# Patient Record
Sex: Female | Born: 1980 | Race: Black or African American | Hispanic: No | Marital: Married | Smoking: Never smoker
Health system: Southern US, Community
[De-identification: ages and names within clinical notes are randomized; demographics above are authoritative.]

## PROBLEM LIST (undated history)

## (undated) ENCOUNTER — Inpatient Hospital Stay (HOSPITAL_COMMUNITY): Payer: Self-pay

## (undated) DIAGNOSIS — D6800 Von Willebrand disease, unspecified: Secondary | ICD-10-CM

## (undated) DIAGNOSIS — O0993 Supervision of high risk pregnancy, unspecified, third trimester: Secondary | ICD-10-CM

## (undated) DIAGNOSIS — T7840XA Allergy, unspecified, initial encounter: Secondary | ICD-10-CM

## (undated) DIAGNOSIS — N390 Urinary tract infection, site not specified: Secondary | ICD-10-CM

## (undated) DIAGNOSIS — I10 Essential (primary) hypertension: Secondary | ICD-10-CM

## (undated) HISTORY — DX: Von Willebrand's disease: D68.0

## (undated) HISTORY — DX: Urinary tract infection, site not specified: N39.0

## (undated) HISTORY — DX: Allergy, unspecified, initial encounter: T78.40XA

## (undated) HISTORY — DX: Supervision of high risk pregnancy, unspecified, third trimester: O09.93

## (undated) HISTORY — PX: GYNECOLOGIC CRYOSURGERY: SHX857

## (undated) HISTORY — PX: OTHER SURGICAL HISTORY: SHX169

---

## 1998-03-21 ENCOUNTER — Other Ambulatory Visit: Admission: RE | Admit: 1998-03-21 | Discharge: 1998-03-21 | Payer: Self-pay | Admitting: Obstetrics & Gynecology

## 1998-04-26 ENCOUNTER — Other Ambulatory Visit: Admission: RE | Admit: 1998-04-26 | Discharge: 1998-04-26 | Payer: Self-pay | Admitting: Obstetrics & Gynecology

## 2000-01-28 ENCOUNTER — Other Ambulatory Visit: Admission: RE | Admit: 2000-01-28 | Discharge: 2000-01-28 | Payer: Self-pay | Admitting: Obstetrics & Gynecology

## 2001-04-12 ENCOUNTER — Other Ambulatory Visit: Admission: RE | Admit: 2001-04-12 | Discharge: 2001-04-12 | Payer: Self-pay | Admitting: Obstetrics and Gynecology

## 2002-04-25 ENCOUNTER — Other Ambulatory Visit: Admission: RE | Admit: 2002-04-25 | Discharge: 2002-04-25 | Payer: Self-pay | Admitting: Obstetrics

## 2002-10-05 ENCOUNTER — Inpatient Hospital Stay (HOSPITAL_COMMUNITY): Admission: AD | Admit: 2002-10-05 | Discharge: 2002-10-09 | Payer: Self-pay | Admitting: Obstetrics

## 2002-10-07 ENCOUNTER — Encounter: Payer: Self-pay | Admitting: Obstetrics

## 2003-03-17 ENCOUNTER — Emergency Department (HOSPITAL_COMMUNITY): Admission: EM | Admit: 2003-03-17 | Discharge: 2003-03-17 | Payer: Self-pay | Admitting: Emergency Medicine

## 2003-04-26 ENCOUNTER — Ambulatory Visit (HOSPITAL_COMMUNITY): Admission: RE | Admit: 2003-04-26 | Discharge: 2003-04-26 | Payer: Self-pay | Admitting: Urology

## 2003-04-26 ENCOUNTER — Encounter: Payer: Self-pay | Admitting: Urology

## 2007-03-10 ENCOUNTER — Ambulatory Visit (HOSPITAL_COMMUNITY): Admission: RE | Admit: 2007-03-10 | Discharge: 2007-03-10 | Payer: Self-pay | Admitting: General Surgery

## 2007-03-25 ENCOUNTER — Emergency Department (HOSPITAL_COMMUNITY): Admission: EM | Admit: 2007-03-25 | Discharge: 2007-03-25 | Payer: Self-pay | Admitting: Emergency Medicine

## 2012-12-16 ENCOUNTER — Encounter (HOSPITAL_COMMUNITY): Payer: Self-pay

## 2012-12-16 ENCOUNTER — Inpatient Hospital Stay (HOSPITAL_COMMUNITY)
Admission: AD | Admit: 2012-12-16 | Discharge: 2012-12-17 | Disposition: A | Discharge status: Home / Self Care | Payer: 59 | Source: Ambulatory Visit | Attending: Obstetrics | Admitting: Obstetrics

## 2012-12-16 ENCOUNTER — Inpatient Hospital Stay (HOSPITAL_COMMUNITY): Payer: 59

## 2012-12-16 DIAGNOSIS — Z363 Encounter for antenatal screening for malformations: Secondary | ICD-10-CM

## 2012-12-16 DIAGNOSIS — D259 Leiomyoma of uterus, unspecified: Secondary | ICD-10-CM | POA: Diagnosis present

## 2012-12-16 DIAGNOSIS — O3410 Maternal care for benign tumor of corpus uteri, unspecified trimester: Secondary | ICD-10-CM | POA: Insufficient documentation

## 2012-12-16 DIAGNOSIS — O209 Hemorrhage in early pregnancy, unspecified: Secondary | ICD-10-CM | POA: Insufficient documentation

## 2012-12-16 DIAGNOSIS — O418X90 Other specified disorders of amniotic fluid and membranes, unspecified trimester, not applicable or unspecified: Secondary | ICD-10-CM | POA: Diagnosis present

## 2012-12-16 DIAGNOSIS — Z3490 Encounter for supervision of normal pregnancy, unspecified, unspecified trimester: Secondary | ICD-10-CM

## 2012-12-16 DIAGNOSIS — O4690 Antepartum hemorrhage, unspecified, unspecified trimester: Secondary | ICD-10-CM | POA: Diagnosis present

## 2012-12-16 DIAGNOSIS — Z1389 Encounter for screening for other disorder: Secondary | ICD-10-CM

## 2012-12-16 DIAGNOSIS — O468X9 Other antepartum hemorrhage, unspecified trimester: Secondary | ICD-10-CM | POA: Diagnosis present

## 2012-12-16 HISTORY — DX: Von Willebrand's disease: D68.0

## 2012-12-16 HISTORY — DX: Von Willebrand disease, unspecified: D68.00

## 2012-12-16 LAB — URINE MICROSCOPIC-ADD ON

## 2012-12-16 LAB — CBC
HCT: 39 % (ref 36.0–46.0)
Hemoglobin: 13.1 g/dL (ref 12.0–15.0)
MCHC: 33.6 g/dL (ref 30.0–36.0)
MCV: 83.7 fL (ref 78.0–100.0)
RDW: 15.4 % (ref 11.5–15.5)

## 2012-12-16 LAB — URINALYSIS, ROUTINE W REFLEX MICROSCOPIC
Bilirubin Urine: NEGATIVE
Glucose, UA: NEGATIVE mg/dL
Ketones, ur: NEGATIVE mg/dL
Leukocytes, UA: NEGATIVE
Nitrite: NEGATIVE
Protein, ur: NEGATIVE mg/dL
Specific Gravity, Urine: <1.005 — ABNORMAL LOW (ref 1.005–1.030)
Urobilinogen, UA: 0.2 mg/dL (ref 0.0–1.0)
pH: 7 (ref 5.0–8.0)

## 2012-12-16 LAB — WET PREP, GENITAL: Trich, Wet Prep: NONE SEEN

## 2012-12-16 LAB — POCT PREGNANCY, URINE: Preg Test, Ur: POSITIVE — AB

## 2012-12-16 MED ORDER — RHO D IMMUNE GLOBULIN 1500 UNIT/2ML IJ SOLN
300.0000 ug | INTRAMUSCULAR | Status: AC
  Administered 2012-12-17: 300 ug via INTRAMUSCULAR

## 2012-12-16 NOTE — Progress Notes (Signed)
Pt states she started having vaginal cramping on  The 12/14/12 pt  Rates her pain 1-2

## 2012-12-16 NOTE — MAU Provider Note (Signed)
History     CSN: [id number]  Arrival date and time: 12/16/12 1743   First Provider Initiated Contact with Patient 12/16/12 1844      Chief Complaint  Patient presents with  . Possible Pregnancy  . Vaginal Bleeding   HPI Ms. Courtney [name] is a 32 y.o. G1 P0 who presents to MAU today with complaint of spotting and cramping. The patient has been spotting lightly since 12/16, which would have coincided with her normal period. It was off and on and very light until about 1 week ago when it became more consistent daily. She still did not need more than a panty liner. She has had mild cramping off and on throughout this time as well. She rates it at a 1-2/10 right now. It has been more persistent for the last 3 days. The patient denies vaginal discharge/itching/burning, N/V/D or fevers today. Her LMP was 11/08/12.    OB History    Grav Para Term Preterm Abortions TAB SAB Ect Mult Living   1               Past Medical History  Diagnosis Date  . Pregnancy induced hypertension   . Von Willebrand disease     Past Surgical History  Procedure Date  . Hemmeroid     No family history on file.  History  Substance Use Topics  . Smoking status: Not on file  . Smokeless tobacco: Not on file  . Alcohol Use: No    Allergies: No Known Allergies  Prescriptions prior to admission  Medication Sig Dispense Refill  . Prenatal Vit-Fe Fumarate-FA (PRENATAL MULTIVITAMIN) TABS Take 1 tablet by mouth daily.      Marland Kitchen triamterene-hydrochlorothiazide (DYAZIDE) 37.5-25 MG per capsule Take 1 capsule by mouth every morning.        ROS All negative unless otherwise noted in HPI Physical Exam   Blood pressure 136/82, pulse 89, temperature 98.5 F (36.9 C), temperature source Oral, resp. rate 16, height 5' 2.5" (1.588 m), weight 162 lb 9.6 oz (73.755 kg), last menstrual period 11/08/2012, SpO2 100.00%.  Physical Exam  Constitutional: She is oriented to person, place, and time. She appears  well-developed and well-nourished. No distress.  HENT:  Head: Normocephalic and atraumatic.  Cardiovascular: Normal rate, regular rhythm and normal heart sounds.   Respiratory: Effort normal and breath sounds normal. No respiratory distress.  GI: Soft. Bowel sounds are normal. She exhibits no distension and no mass. There is no tenderness. There is no rebound and no guarding.  Genitourinary: Vagina normal. Uterus is not enlarged and not tender. Cervix exhibits discharge (thick, white, clumpy discharge and dark red blood in the vagina and at the cervical os). Cervix exhibits no motion tenderness and no friability.  Neurological: She is alert and oriented to person, place, and time.  Skin: Skin is warm and dry. No erythema.  Psychiatric: She has a normal mood and affect.   Results for orders placed during the hospital encounter of 12/16/12 (from the past 24 hour(s))  URINALYSIS, ROUTINE W REFLEX MICROSCOPIC     Status: Abnormal   Collection Time   12/16/12  6:15 PM      Component Value Range   Color, Urine YELLOW  YELLOW   APPearance CLEAR  CLEAR   Specific Gravity, Urine <1.005 (*) 1.005 - 1.030   pH 7.0  5.0 - 8.0   Glucose, UA NEGATIVE  NEGATIVE mg/dL   Hgb urine dipstick MODERATE (*) NEGATIVE   Bilirubin Urine NEGATIVE  NEGATIVE   Ketones, ur NEGATIVE  NEGATIVE mg/dL   Protein, ur NEGATIVE  NEGATIVE mg/dL   Urobilinogen, UA 0.2  0.0 - 1.0 mg/dL   Nitrite NEGATIVE  NEGATIVE   Leukocytes, UA NEGATIVE  NEGATIVE  URINE MICROSCOPIC-ADD ON     Status: Normal   Collection Time   12/16/12  6:15 PM      Component Value Range   Squamous Epithelial / LPF RARE  RARE   RBC / HPF 0-2  <3 RBC/hpf  POCT PREGNANCY, URINE     Status: Abnormal   Collection Time   12/16/12  6:20 PM      Component Value Range   Preg Test, Ur POSITIVE (*) NEGATIVE  WET PREP, GENITAL     Status: Abnormal   Collection Time   12/16/12  6:58 PM      Component Value Range   Yeast Wet Prep HPF POC FEW (*) NONE SEEN    Trich, Wet Prep NONE SEEN  NONE SEEN   Clue Cells Wet Prep HPF POC NONE SEEN  NONE SEEN   WBC, Wet Prep HPF POC FEW (*) NONE SEEN  CBC     Status: Normal   Collection Time   12/16/12  7:14 PM      Component Value Range   WBC 8.0  4.0 - 10.5 K/uL   RBC 4.66  3.87 - 5.11 MIL/uL   Hemoglobin 13.1  12.0 - 15.0 g/dL   HCT 16.1  09.6 - 04.5 %   MCV 83.7  78.0 - 100.0 fL   MCH 28.1  26.0 - 34.0 pg   MCHC 33.6  30.0 - 36.0 g/dL   RDW 40.9  81.1 - 91.4 %   Platelets 216  150 - 400 K/uL    MAU Course  Procedures  MDM Wet prep, GC/Chlamydia, CBC, ABO/Rh, quant hCG today [place] ordered  Assessment and Plan  2010 - care turned over to [facility], CNM   Freddi Starr, PA-C 12/16/2012, 7:00 PM   A: 1. Normal IUP (intrauterine pregnancy) on prenatal ultrasound   2. Uterine fibroid in pregnancy   3. Vaginal bleeding in pregnancy   4. Subchorionic hemorrhage    P: Rhogam r/t Rh neg TSH drawn in MAU per Dr Clearance Coots F/U outpatient U/S scheduled at 8 weeks Pt to call Dr Clearance Coots to set up prenatal appointment Return to MAU as needed  Sharen Counter Certified Nurse-Midwife

## 2012-12-16 NOTE — MAU Note (Signed)
Pt states she started having spotting pt states she started having spotting on the 11/29/2012. Pt states she started having nausea and vomiting.

## 2012-12-16 NOTE — MAU Note (Signed)
Patient states she had a positive home pregnancy test x 2 at home. States she started spotting off and on through December and continues. Some nausea, last vomited 1-1.

## 2012-12-17 LAB — ABO/RH: ABO/RH(D): A NEG

## 2012-12-17 LAB — TSH: TSH: 2.233 u[IU]/mL (ref 0.350–4.500)

## 2012-12-17 LAB — GC/CHLAMYDIA PROBE AMP: CT Probe RNA: NEGATIVE

## 2012-12-18 LAB — RH IG WORKUP (INCLUDES ABO/RH)
ABO/RH(D): A NEG
Antibody Screen: NEGATIVE
Unit division: 0

## 2013-01-06 LAB — OB RESULTS CONSOLE ABO/RH: RH Type: NEGATIVE

## 2013-01-06 LAB — OB RESULTS CONSOLE HGB/HCT, BLOOD
HCT: 42 %
Hemoglobin: 14.2 g/dL

## 2013-01-06 LAB — OB RESULTS CONSOLE HEPATITIS B SURFACE ANTIGEN: Hepatitis B Surface Ag: NEGATIVE

## 2013-01-06 LAB — OB RESULTS CONSOLE ANTIBODY SCREEN: Antibody Screen: POSITIVE

## 2013-01-06 LAB — OB RESULTS CONSOLE GC/CHLAMYDIA: Gonorrhea: NEGATIVE

## 2013-01-20 ENCOUNTER — Ambulatory Visit (HOSPITAL_COMMUNITY)
Admission: RE | Admit: 2013-01-20 | Discharge: 2013-01-20 | Disposition: A | Discharge status: Home / Self Care | Payer: 59 | Source: Ambulatory Visit | Attending: Advanced Practice Midwife | Admitting: Advanced Practice Midwife

## 2013-01-20 DIAGNOSIS — D259 Leiomyoma of uterus, unspecified: Secondary | ICD-10-CM | POA: Insufficient documentation

## 2013-01-20 DIAGNOSIS — Z3689 Encounter for other specified antenatal screening: Secondary | ICD-10-CM | POA: Insufficient documentation

## 2013-01-20 DIAGNOSIS — O3410 Maternal care for benign tumor of corpus uteri, unspecified trimester: Secondary | ICD-10-CM | POA: Insufficient documentation

## 2013-02-01 ENCOUNTER — Encounter (HOSPITAL_COMMUNITY): Payer: Self-pay | Admitting: *Deleted

## 2013-02-01 ENCOUNTER — Inpatient Hospital Stay (HOSPITAL_COMMUNITY)
Admission: AD | Admit: 2013-02-01 | Discharge: 2013-02-01 | Disposition: A | Discharge status: Home / Self Care | Payer: 59 | Source: Ambulatory Visit | Attending: Obstetrics | Admitting: Obstetrics

## 2013-02-01 DIAGNOSIS — O209 Hemorrhage in early pregnancy, unspecified: Secondary | ICD-10-CM | POA: Insufficient documentation

## 2013-02-01 HISTORY — DX: Essential (primary) hypertension: I10

## 2013-02-01 NOTE — MAU Provider Note (Signed)
  History     CSN: [id number]  Arrival date and time: 02/01/13 1522   First Provider Initiated Contact with Patient 02/01/13 1741      Chief Complaint  Patient presents with  . Vaginal Bleeding   HPI This is a 32 y.o. female at [redacted]w[redacted]d presents with c/o vaginal bleeding which is brown in color, since January. Is worried because it has never completely stopped. Was seen here in January and had a normal ultrasound. Denies pain. Has appt this week with Dr Clearance Coots  RN Note: Patient states she has dark red bleeding since about 1500. No pain. Patient has on a mini pad with a strip of dark blood on it. No actve bleeding.       OB History   Grav Para Term Preterm Abortions TAB SAB Ect Mult Living   1               Past Medical History  Diagnosis Date  . Von Willebrand disease   . Hypertension     Past Surgical History  Procedure Laterality Date  . Hemmeroid    . Gynecologic cryosurgery      History reviewed. No pertinent family history.  History  Substance Use Topics  . Smoking status: Never Smoker   . Smokeless tobacco: Not on file  . Alcohol Use: No    Allergies: No Known Allergies  Prescriptions prior to admission  Medication Sig Dispense Refill  . Prenatal Vit-Fe Fumarate-FA (PRENATAL MULTIVITAMIN) TABS Take 1 tablet by mouth daily.        Review of Systems  Constitutional: Negative for fever.  Gastrointestinal: Negative for nausea, vomiting and abdominal pain.  Genitourinary:       Brown spotting  Musculoskeletal: Negative for myalgias.  Neurological: Negative for headaches.   Physical Exam   Blood pressure 129/78, pulse 91, temperature 99 F (37.2 C), temperature source Oral, resp. rate 16, height 5' 2.5" (1.588 m), weight 167 lb (75.751 kg), last menstrual period 11/08/2012, SpO2 99.00%.  Physical Exam  Constitutional: She is oriented to person, place, and time. She appears well-developed and well-nourished. No distress.  Cardiovascular: Normal rate.    Respiratory: Effort normal.  GI: Soft. She exhibits no distension. There is no tenderness. There is no rebound and no guarding.  Genitourinary: Uterus normal. Vaginal discharge (small to moderate dark brown discharge) found.  Cervix long and closed Fetal heart rate easily auscultated at 160s  Musculoskeletal: Normal range of motion.  Neurological: She is alert and oriented to person, place, and time.  Skin: Skin is warm and dry.  Psychiatric: She has a normal mood and affect.   FHT 158  MAU Course  Procedures  Assessment and Plan  A:  SIUP at [redacted]w[redacted]d      Old bleeding      No obvious cause for bleeding, but viable fetus  P:  Reassured       Follow up with Dr Clearance Coots.  Wynelle Bourgeois 02/01/2013, 5:59 PM

## 2013-02-01 NOTE — MAU Note (Signed)
Patient states she has dark red bleeding since about 1500. No pain. Patient has on a mini pad with a strip of dark blood on it. No actve bleeding.

## 2013-02-22 ENCOUNTER — Other Ambulatory Visit: Payer: Self-pay | Admitting: Obstetrics

## 2013-02-22 DIAGNOSIS — Z369 Encounter for antenatal screening, unspecified: Secondary | ICD-10-CM

## 2013-02-22 DIAGNOSIS — O9989 Other specified diseases and conditions complicating pregnancy, childbirth and the puerperium: Secondary | ICD-10-CM

## 2013-03-10 ENCOUNTER — Encounter: Payer: Self-pay | Admitting: Obstetrics

## 2013-03-10 ENCOUNTER — Other Ambulatory Visit: Payer: 59

## 2013-03-10 ENCOUNTER — Ambulatory Visit (INDEPENDENT_AMBULATORY_CARE_PROVIDER_SITE_OTHER): Payer: 59 | Admitting: Obstetrics

## 2013-03-10 VITALS — BP 135/86 | Temp 98.8°F | Wt 173.0 lb

## 2013-03-10 DIAGNOSIS — Z3400 Encounter for supervision of normal first pregnancy, unspecified trimester: Secondary | ICD-10-CM

## 2013-03-10 DIAGNOSIS — Z3482 Encounter for supervision of other normal pregnancy, second trimester: Secondary | ICD-10-CM

## 2013-03-10 DIAGNOSIS — Z3402 Encounter for supervision of normal first pregnancy, second trimester: Secondary | ICD-10-CM

## 2013-03-10 LAB — POCT URINALYSIS DIPSTICK
Bilirubin, UA: NEGATIVE
Blood, UA: NEGATIVE
Glucose, UA: NEGATIVE
Spec Grav, UA: 1.015
pH, UA: 5

## 2013-03-10 NOTE — Progress Notes (Signed)
Pt states she is having lower back pain. Pt states she feels like someone is on both side of her abdomen stretching them. Pt states she is having pressure like someone is pushing sher bladder when urinates.

## 2013-03-10 NOTE — Addendum Note (Signed)
Addended by: Brock Bad on: 03/10/2013 06:49 PM   Modules accepted: Orders

## 2013-03-10 NOTE — Progress Notes (Signed)
No complaints. Doing well.

## 2013-03-22 ENCOUNTER — Encounter: Payer: Self-pay | Admitting: Obstetrics

## 2013-03-22 ENCOUNTER — Ambulatory Visit (HOSPITAL_COMMUNITY)
Admission: RE | Admit: 2013-03-22 | Discharge: 2013-03-22 | Disposition: A | Discharge status: Home / Self Care | Payer: 59 | Source: Ambulatory Visit | Attending: Obstetrics | Admitting: Obstetrics

## 2013-03-22 DIAGNOSIS — Z3689 Encounter for other specified antenatal screening: Secondary | ICD-10-CM | POA: Insufficient documentation

## 2013-03-22 DIAGNOSIS — O10019 Pre-existing essential hypertension complicating pregnancy, unspecified trimester: Secondary | ICD-10-CM | POA: Insufficient documentation

## 2013-03-22 DIAGNOSIS — O3410 Maternal care for benign tumor of corpus uteri, unspecified trimester: Secondary | ICD-10-CM | POA: Insufficient documentation

## 2013-03-22 DIAGNOSIS — Z3402 Encounter for supervision of normal first pregnancy, second trimester: Secondary | ICD-10-CM

## 2013-03-22 LAB — US OB DETAIL + 14 WK

## 2013-03-30 ENCOUNTER — Encounter: Payer: Self-pay | Admitting: Obstetrics

## 2013-03-30 ENCOUNTER — Encounter: Payer: Self-pay | Admitting: *Deleted

## 2013-03-31 ENCOUNTER — Encounter: Payer: Self-pay | Admitting: *Deleted

## 2013-04-04 ENCOUNTER — Encounter: Payer: Self-pay | Admitting: *Deleted

## 2013-04-05 ENCOUNTER — Ambulatory Visit (INDEPENDENT_AMBULATORY_CARE_PROVIDER_SITE_OTHER): Payer: 59 | Admitting: Obstetrics

## 2013-04-05 ENCOUNTER — Encounter: Payer: Self-pay | Admitting: Obstetrics

## 2013-04-05 VITALS — BP 138/88 | Temp 97.8°F | Wt 179.0 lb

## 2013-04-05 DIAGNOSIS — Z3400 Encounter for supervision of normal first pregnancy, unspecified trimester: Secondary | ICD-10-CM

## 2013-04-05 LAB — POCT URINALYSIS DIPSTICK
Blood, UA: NEGATIVE
Ketones, UA: NEGATIVE
Spec Grav, UA: 1.015
Urobilinogen, UA: NEGATIVE

## 2013-04-05 MED ORDER — CITRANATAL HARMONY 27-1-250 MG PO CAPS: 1.0000 | ORAL_CAPSULE | Freq: Every day | ORAL | Status: DC

## 2013-04-05 NOTE — Progress Notes (Signed)
Pulse-112 Pt c/o right hip pain intermittent with exertion x 2 weeks. Pt reports feeling "stretching" in abdominal area and intermittent lower back pain.

## 2013-04-08 ENCOUNTER — Encounter: Payer: Self-pay | Admitting: Obstetrics

## 2013-04-15 ENCOUNTER — Encounter: Payer: Self-pay | Admitting: Obstetrics

## 2013-04-18 ENCOUNTER — Encounter: Payer: Self-pay | Admitting: Obstetrics

## 2013-04-18 ENCOUNTER — Ambulatory Visit (INDEPENDENT_AMBULATORY_CARE_PROVIDER_SITE_OTHER): Payer: 59 | Admitting: Obstetrics

## 2013-04-18 ENCOUNTER — Other Ambulatory Visit: Payer: 59 | Admitting: *Deleted

## 2013-04-18 VITALS — BP 126/83 | Temp 98.1°F | Wt 182.0 lb

## 2013-04-18 DIAGNOSIS — Z3402 Encounter for supervision of normal first pregnancy, second trimester: Secondary | ICD-10-CM

## 2013-04-18 DIAGNOSIS — Z3400 Encounter for supervision of normal first pregnancy, unspecified trimester: Secondary | ICD-10-CM

## 2013-04-18 DIAGNOSIS — O360990 Maternal care for other rhesus isoimmunization, unspecified trimester, not applicable or unspecified: Secondary | ICD-10-CM

## 2013-04-18 DIAGNOSIS — O360131 Maternal care for anti-D [Rh] antibodies, third trimester, fetus 1: Secondary | ICD-10-CM

## 2013-04-18 LAB — POCT URINALYSIS DIPSTICK
Glucose, UA: NEGATIVE
Leukocytes, UA: NEGATIVE
Protein, UA: NEGATIVE
Urobilinogen, UA: NEGATIVE

## 2013-04-18 MED ORDER — RHO D IMMUNE GLOBULIN 1500 UNIT/2ML IJ SOLN
300.0000 ug | Freq: Once | INTRAMUSCULAR | Status: AC
  Administered 2013-05-23: 300 ug via INTRAMUSCULAR

## 2013-04-18 NOTE — Progress Notes (Signed)
Pulse -96 Pt states she is having some intermittent pressure in the evening.

## 2013-04-19 LAB — CBC
Hemoglobin: 12.9 g/dL (ref 12.0–15.0)
MCH: 30.2 pg (ref 26.0–34.0)
MCV: 89.5 fL (ref 78.0–100.0)
Platelets: 159 10*3/uL (ref 150–400)
RBC: 4.27 MIL/uL (ref 3.87–5.11)
WBC: 10.5 10*3/uL (ref 4.0–10.5)

## 2013-04-19 LAB — GLUCOSE TOLERANCE, 2 HOURS W/ 1HR
Glucose, 1 hour: 98 mg/dL (ref 70–170)
Glucose, Fasting: 59 mg/dL — ABNORMAL LOW (ref 70–99)

## 2013-04-19 LAB — RPR

## 2013-05-16 ENCOUNTER — Encounter: Payer: 59 | Admitting: Obstetrics

## 2013-05-23 ENCOUNTER — Ambulatory Visit (INDEPENDENT_AMBULATORY_CARE_PROVIDER_SITE_OTHER): Payer: 59 | Admitting: Obstetrics

## 2013-05-23 VITALS — Temp 98.3°F | Wt 187.6 lb

## 2013-05-23 DIAGNOSIS — Z3403 Encounter for supervision of normal first pregnancy, third trimester: Secondary | ICD-10-CM

## 2013-05-23 DIAGNOSIS — Z3400 Encounter for supervision of normal first pregnancy, unspecified trimester: Secondary | ICD-10-CM

## 2013-05-23 DIAGNOSIS — Z6791 Unspecified blood type, Rh negative: Secondary | ICD-10-CM | POA: Insufficient documentation

## 2013-05-23 DIAGNOSIS — O3663X1 Maternal care for excessive fetal growth, third trimester, fetus 1: Secondary | ICD-10-CM

## 2013-05-23 DIAGNOSIS — O360990 Maternal care for other rhesus isoimmunization, unspecified trimester, not applicable or unspecified: Secondary | ICD-10-CM

## 2013-05-23 DIAGNOSIS — O26899 Other specified pregnancy related conditions, unspecified trimester: Secondary | ICD-10-CM | POA: Insufficient documentation

## 2013-05-23 DIAGNOSIS — O3660X0 Maternal care for excessive fetal growth, unspecified trimester, not applicable or unspecified: Secondary | ICD-10-CM

## 2013-05-23 DIAGNOSIS — O360130 Maternal care for anti-D [Rh] antibodies, third trimester, not applicable or unspecified: Secondary | ICD-10-CM

## 2013-05-23 LAB — POCT URINALYSIS DIPSTICK
Glucose, UA: NEGATIVE
Nitrite, UA: NEGATIVE
Spec Grav, UA: 1.02
Urobilinogen, UA: NEGATIVE

## 2013-05-23 MED ORDER — RHO D IMMUNE GLOBULIN 300 MCG IM INJ: 300.0000 ug | INJECTION | Freq: Once | INTRAMUSCULAR | Status: DC

## 2013-05-23 NOTE — Addendum Note (Signed)
Addended by: George Hugh on: 05/23/2013 05:17 PM   Modules accepted: Orders

## 2013-05-23 NOTE — Progress Notes (Signed)
Pulse: 108

## 2013-05-24 LAB — WET PREP BY MOLECULAR PROBE: Candida species: POSITIVE — AB

## 2013-05-27 ENCOUNTER — Ambulatory Visit (HOSPITAL_COMMUNITY)
Admission: RE | Admit: 2013-05-27 | Discharge: 2013-05-27 | Disposition: A | Discharge status: Home / Self Care | Payer: 59 | Source: Ambulatory Visit | Attending: Obstetrics | Admitting: Obstetrics

## 2013-05-27 DIAGNOSIS — O3660X0 Maternal care for excessive fetal growth, unspecified trimester, not applicable or unspecified: Secondary | ICD-10-CM | POA: Insufficient documentation

## 2013-05-27 DIAGNOSIS — O3663X1 Maternal care for excessive fetal growth, third trimester, fetus 1: Secondary | ICD-10-CM

## 2013-05-27 DIAGNOSIS — O3410 Maternal care for benign tumor of corpus uteri, unspecified trimester: Secondary | ICD-10-CM | POA: Insufficient documentation

## 2013-05-27 DIAGNOSIS — Z3689 Encounter for other specified antenatal screening: Secondary | ICD-10-CM | POA: Insufficient documentation

## 2013-05-30 ENCOUNTER — Other Ambulatory Visit: Payer: Self-pay | Admitting: *Deleted

## 2013-05-30 ENCOUNTER — Telehealth: Payer: Self-pay | Admitting: *Deleted

## 2013-05-30 DIAGNOSIS — B379 Candidiasis, unspecified: Secondary | ICD-10-CM

## 2013-05-30 MED ORDER — FLUCONAZOLE 150 MG PO TABS: 150.0000 mg | ORAL_TABLET | Freq: Once | ORAL | Status: DC

## 2013-05-30 NOTE — Progress Notes (Signed)
Pt notified of test results and rx called sent to [facility]

## 2013-05-30 NOTE — Telephone Encounter (Signed)
Pt notified of test results and rx sent to the pharmacy.

## 2013-06-07 ENCOUNTER — Encounter: Payer: 59 | Admitting: Obstetrics

## 2013-06-13 ENCOUNTER — Encounter: Payer: Self-pay | Admitting: Obstetrics

## 2013-06-13 ENCOUNTER — Ambulatory Visit (INDEPENDENT_AMBULATORY_CARE_PROVIDER_SITE_OTHER): Payer: 59 | Admitting: Obstetrics

## 2013-06-13 VITALS — BP 133/83 | Temp 98.2°F | Wt 191.4 lb

## 2013-06-13 DIAGNOSIS — Z3400 Encounter for supervision of normal first pregnancy, unspecified trimester: Secondary | ICD-10-CM

## 2013-06-13 DIAGNOSIS — Z3403 Encounter for supervision of normal first pregnancy, third trimester: Secondary | ICD-10-CM

## 2013-06-13 LAB — POCT URINALYSIS DIPSTICK
Blood, UA: NEGATIVE
Protein, UA: NEGATIVE
Spec Grav, UA: 1.01

## 2013-06-13 NOTE — Progress Notes (Signed)
Pulse-111 Pt c/o right knee and ankle pain with "clicking" in hip, knee, and ankle. Headaches. Finger swelling. Pelvic and vaginal pain to the touch. Pt c/o increased indigestion, nausea, vomiting and diarrhea x 3 days.

## 2013-06-30 ENCOUNTER — Encounter: Payer: Self-pay | Admitting: *Deleted

## 2013-06-30 ENCOUNTER — Ambulatory Visit (INDEPENDENT_AMBULATORY_CARE_PROVIDER_SITE_OTHER): Payer: 59 | Admitting: Obstetrics

## 2013-06-30 ENCOUNTER — Encounter: Payer: Self-pay | Admitting: Obstetrics

## 2013-06-30 VITALS — BP 157/84 | Temp 98.1°F | Wt 196.0 lb

## 2013-06-30 DIAGNOSIS — Z3403 Encounter for supervision of normal first pregnancy, third trimester: Secondary | ICD-10-CM

## 2013-06-30 DIAGNOSIS — Z3400 Encounter for supervision of normal first pregnancy, unspecified trimester: Secondary | ICD-10-CM

## 2013-06-30 LAB — POCT URINALYSIS DIPSTICK
Blood, UA: NEGATIVE
Ketones, UA: NEGATIVE
Leukocytes, UA: NEGATIVE
Protein, UA: NEGATIVE
Spec Grav, UA: <=1.005
pH, UA: 7

## 2013-06-30 NOTE — Progress Notes (Signed)
Pulse- 105 Pelvic pressure  Pt states she is having issue with her Tachycardia

## 2013-07-01 ENCOUNTER — Encounter: Payer: Self-pay | Admitting: *Deleted

## 2013-07-01 ENCOUNTER — Encounter: Payer: Self-pay | Admitting: Obstetrics

## 2013-07-04 ENCOUNTER — Encounter: Payer: Self-pay | Admitting: Obstetrics

## 2013-07-04 ENCOUNTER — Encounter: Payer: Self-pay | Admitting: Obstetrics & Gynecology

## 2013-07-11 ENCOUNTER — Ambulatory Visit (INDEPENDENT_AMBULATORY_CARE_PROVIDER_SITE_OTHER): Payer: 59 | Admitting: Obstetrics

## 2013-07-11 ENCOUNTER — Encounter: Payer: Self-pay | Admitting: Obstetrics

## 2013-07-11 VITALS — BP 131/84 | Temp 98.1°F | Wt 199.0 lb

## 2013-07-11 DIAGNOSIS — Z3400 Encounter for supervision of normal first pregnancy, unspecified trimester: Secondary | ICD-10-CM

## 2013-07-11 DIAGNOSIS — Z3403 Encounter for supervision of normal first pregnancy, third trimester: Secondary | ICD-10-CM

## 2013-07-11 LAB — POCT URINALYSIS DIPSTICK
Bilirubin, UA: NEGATIVE
Blood, UA: NEGATIVE
Ketones, UA: NEGATIVE
Spec Grav, UA: <=1.005
pH, UA: 7

## 2013-07-11 NOTE — Progress Notes (Signed)
P-98 Pt states she is having pain in her lower back and ribs. Pt states she is having increased headaches. Pt states she is still having tachycardiac events. Pt states not as often as when she was working.

## 2013-07-11 NOTE — Addendum Note (Signed)
Addended by: Julaine Hua on: 07/11/2013 03:18 PM   Modules accepted: Orders

## 2013-07-14 ENCOUNTER — Encounter: Payer: 59 | Admitting: Obstetrics

## 2013-07-14 ENCOUNTER — Encounter: Payer: Self-pay | Admitting: Obstetrics

## 2013-07-20 ENCOUNTER — Ambulatory Visit (INDEPENDENT_AMBULATORY_CARE_PROVIDER_SITE_OTHER): Payer: 59 | Admitting: Obstetrics

## 2013-07-20 VITALS — BP 133/77 | Temp 98.0°F | Wt 202.4 lb

## 2013-07-20 DIAGNOSIS — Z3400 Encounter for supervision of normal first pregnancy, unspecified trimester: Secondary | ICD-10-CM

## 2013-07-20 DIAGNOSIS — Z3403 Encounter for supervision of normal first pregnancy, third trimester: Secondary | ICD-10-CM

## 2013-07-20 LAB — POCT URINALYSIS DIPSTICK
Glucose, UA: NEGATIVE
Ketones, UA: NEGATIVE
Leukocytes, UA: NEGATIVE
Spec Grav, UA: <=1.005

## 2013-07-20 NOTE — Progress Notes (Signed)
Pulse-96 Pt c/o headaches and lower back pain.

## 2013-07-21 ENCOUNTER — Encounter: Payer: Self-pay | Admitting: Obstetrics

## 2013-07-27 ENCOUNTER — Encounter: Payer: Self-pay | Admitting: Obstetrics

## 2013-07-27 ENCOUNTER — Ambulatory Visit (INDEPENDENT_AMBULATORY_CARE_PROVIDER_SITE_OTHER): Payer: 59 | Admitting: Obstetrics

## 2013-07-27 VITALS — BP 154/89 | Temp 98.8°F | Wt 205.0 lb

## 2013-07-27 DIAGNOSIS — Z3403 Encounter for supervision of normal first pregnancy, third trimester: Secondary | ICD-10-CM

## 2013-07-27 DIAGNOSIS — Z3400 Encounter for supervision of normal first pregnancy, unspecified trimester: Secondary | ICD-10-CM

## 2013-07-27 DIAGNOSIS — O3663X1 Maternal care for excessive fetal growth, third trimester, fetus 1: Secondary | ICD-10-CM

## 2013-07-27 DIAGNOSIS — O3660X0 Maternal care for excessive fetal growth, unspecified trimester, not applicable or unspecified: Secondary | ICD-10-CM | POA: Insufficient documentation

## 2013-07-27 LAB — POCT URINALYSIS DIPSTICK
Bilirubin, UA: NEGATIVE
Ketones, UA: NEGATIVE
Nitrite, UA: NEGATIVE

## 2013-07-27 NOTE — Progress Notes (Signed)
P 102 Patient reports she had headache 2 days ago and today. Patient hydrated and it went away. 129/92/102 on 8/7

## 2013-07-27 NOTE — Progress Notes (Signed)
130/72  

## 2013-07-28 ENCOUNTER — Other Ambulatory Visit: Payer: Self-pay | Admitting: Obstetrics

## 2013-07-28 ENCOUNTER — Ambulatory Visit (HOSPITAL_COMMUNITY)
Admission: RE | Admit: 2013-07-28 | Discharge: 2013-07-28 | Disposition: A | Discharge status: Home / Self Care | Payer: 59 | Source: Ambulatory Visit | Attending: Obstetrics | Admitting: Obstetrics

## 2013-07-28 DIAGNOSIS — O3410 Maternal care for benign tumor of corpus uteri, unspecified trimester: Secondary | ICD-10-CM | POA: Insufficient documentation

## 2013-07-28 DIAGNOSIS — O3660X0 Maternal care for excessive fetal growth, unspecified trimester, not applicable or unspecified: Secondary | ICD-10-CM | POA: Insufficient documentation

## 2013-07-28 DIAGNOSIS — O3663X1 Maternal care for excessive fetal growth, third trimester, fetus 1: Secondary | ICD-10-CM

## 2013-08-03 ENCOUNTER — Ambulatory Visit (INDEPENDENT_AMBULATORY_CARE_PROVIDER_SITE_OTHER): Payer: 59 | Admitting: Obstetrics

## 2013-08-03 VITALS — BP 120/76 | Temp 98.7°F | Wt 205.0 lb

## 2013-08-03 DIAGNOSIS — Z3400 Encounter for supervision of normal first pregnancy, unspecified trimester: Secondary | ICD-10-CM

## 2013-08-03 DIAGNOSIS — Z3403 Encounter for supervision of normal first pregnancy, third trimester: Secondary | ICD-10-CM

## 2013-08-03 LAB — POCT URINALYSIS DIPSTICK
Blood, UA: NEGATIVE
Glucose, UA: NEGATIVE
Spec Grav, UA: 1.015
Urobilinogen, UA: NEGATIVE

## 2013-08-03 NOTE — Progress Notes (Signed)
Pulse 101, c/o unresolved headaches every other day about twice a day-no self care measures, c/o not sleeping well

## 2013-08-09 ENCOUNTER — Ambulatory Visit (INDEPENDENT_AMBULATORY_CARE_PROVIDER_SITE_OTHER): Payer: 59 | Admitting: Obstetrics

## 2013-08-09 ENCOUNTER — Encounter: Payer: Self-pay | Admitting: Obstetrics

## 2013-08-09 VITALS — BP 149/85 | Temp 98.6°F | Wt 209.6 lb

## 2013-08-09 DIAGNOSIS — Z3400 Encounter for supervision of normal first pregnancy, unspecified trimester: Secondary | ICD-10-CM

## 2013-08-09 DIAGNOSIS — Z3403 Encounter for supervision of normal first pregnancy, third trimester: Secondary | ICD-10-CM

## 2013-08-09 LAB — POCT URINALYSIS DIPSTICK
Ketones, UA: NEGATIVE
Protein, UA: NEGATIVE
Spec Grav, UA: <=1.005

## 2013-08-09 NOTE — Progress Notes (Signed)
P- 102  Pt states she is having pain and pressure in vaginal area and lower back.

## 2013-08-10 ENCOUNTER — Encounter: Payer: 59 | Admitting: Obstetrics

## 2013-08-16 ENCOUNTER — Inpatient Hospital Stay (HOSPITAL_COMMUNITY)
Admission: AD | Admit: 2013-08-16 | Discharge: 2013-08-16 | Disposition: A | Discharge status: Home / Self Care | Payer: 59 | Source: Ambulatory Visit | Attending: Obstetrics | Admitting: Obstetrics

## 2013-08-16 ENCOUNTER — Inpatient Hospital Stay (HOSPITAL_COMMUNITY)
Admission: AD | Admit: 2013-08-16 | Discharge: 2013-08-20 | DRG: 765 | Disposition: A | Discharge status: Home / Self Care | Payer: 59 | Source: Ambulatory Visit | Attending: Obstetrics | Admitting: Obstetrics

## 2013-08-16 ENCOUNTER — Encounter (HOSPITAL_COMMUNITY): Payer: Self-pay | Admitting: *Deleted

## 2013-08-16 ENCOUNTER — Encounter: Payer: 59 | Admitting: Obstetrics

## 2013-08-16 DIAGNOSIS — D6800 Von Willebrand disease, unspecified: Secondary | ICD-10-CM | POA: Diagnosis present

## 2013-08-16 DIAGNOSIS — D689 Coagulation defect, unspecified: Secondary | ICD-10-CM | POA: Diagnosis present

## 2013-08-16 DIAGNOSIS — M549 Dorsalgia, unspecified: Secondary | ICD-10-CM | POA: Insufficient documentation

## 2013-08-16 DIAGNOSIS — O99892 Other specified diseases and conditions complicating childbirth: Secondary | ICD-10-CM | POA: Diagnosis present

## 2013-08-16 DIAGNOSIS — O139 Gestational [pregnancy-induced] hypertension without significant proteinuria, unspecified trimester: Principal | ICD-10-CM | POA: Diagnosis present

## 2013-08-16 DIAGNOSIS — O324XX0 Maternal care for high head at term, not applicable or unspecified: Secondary | ICD-10-CM | POA: Diagnosis present

## 2013-08-16 DIAGNOSIS — Z22330 Carrier of Group B streptococcus: Secondary | ICD-10-CM

## 2013-08-16 DIAGNOSIS — O99891 Other specified diseases and conditions complicating pregnancy: Secondary | ICD-10-CM | POA: Insufficient documentation

## 2013-08-16 DIAGNOSIS — R109 Unspecified abdominal pain: Secondary | ICD-10-CM | POA: Insufficient documentation

## 2013-08-16 MED ORDER — OXYCODONE-ACETAMINOPHEN 5-325 MG PO TABS
2.0000 | ORAL_TABLET | Freq: Once | ORAL | Status: AC
  Administered 2013-08-16: 2 via ORAL
  Filled 2013-08-16: qty 2

## 2013-08-16 NOTE — MAU Note (Signed)
Contractions increased since 10pm tonight; last fetal movement before contractions started. Some bloody show tonight. States was checked in MAU earlier today & was 1cm/80%. Denies LOF.

## 2013-08-16 NOTE — MAU Note (Signed)
Back pain since around 0500. Around 0800, noted back pain was rhythmic with abdominal tightening.

## 2013-08-16 NOTE — MAU Note (Signed)
LEFT MAU AT 145PM-   HAS CONTINUED TO HAVE UC BUT NOT PAINFUL.  AT 10PM-   UC WORSE  .   -  FEELING PRESSURE WITH BLOODY SHOW.    DENIES HSV AND MRSA.

## 2013-08-17 ENCOUNTER — Encounter (HOSPITAL_COMMUNITY)
Admission: AD | Disposition: A | Discharge status: Home / Self Care | Payer: Self-pay | Source: Ambulatory Visit | Attending: Obstetrics

## 2013-08-17 ENCOUNTER — Encounter (HOSPITAL_COMMUNITY): Payer: Self-pay | Admitting: Anesthesiology

## 2013-08-17 ENCOUNTER — Inpatient Hospital Stay (HOSPITAL_COMMUNITY): Payer: 59 | Admitting: Anesthesiology

## 2013-08-17 ENCOUNTER — Encounter (HOSPITAL_COMMUNITY): Payer: Self-pay | Admitting: Obstetrics & Gynecology

## 2013-08-17 ENCOUNTER — Encounter: Payer: 59 | Admitting: Obstetrics

## 2013-08-17 LAB — TYPE AND SCREEN

## 2013-08-17 LAB — CBC
Hemoglobin: 13.9 g/dL (ref 12.0–15.0)
MCH: 29.5 pg (ref 26.0–34.0)
MCV: 85.6 fL (ref 78.0–100.0)
Platelets: 122 10*3/uL — ABNORMAL LOW (ref 150–400)
RBC: 4.71 MIL/uL (ref 3.87–5.11)
WBC: 12.4 10*3/uL — ABNORMAL HIGH (ref 4.0–10.5)

## 2013-08-17 LAB — COMPREHENSIVE METABOLIC PANEL
BUN: 8 mg/dL (ref 6–23)
CO2: 19 mEq/L (ref 19–32)
Calcium: 9.9 mg/dL (ref 8.4–10.5)
Chloride: 100 mEq/L (ref 96–112)
Creatinine, Ser: 0.68 mg/dL (ref 0.50–1.10)
GFR calc Af Amer: >90 mL/min (ref >90)
GFR calc non Af Amer: >90 mL/min (ref >90)
Glucose, Bld: 78 mg/dL (ref 70–99)
Total Bilirubin: 0.4 mg/dL (ref 0.3–1.2)

## 2013-08-17 LAB — RPR: RPR Ser Ql: NONREACTIVE

## 2013-08-17 LAB — PROTIME-INR: Prothrombin Time: 12.4 seconds (ref 11.6–15.2)

## 2013-08-17 LAB — LACTATE DEHYDROGENASE: LDH: 289 U/L — ABNORMAL HIGH (ref 94–250)

## 2013-08-17 LAB — APTT: aPTT: 27 seconds (ref 24–37)

## 2013-08-17 SURGERY — Surgical Case
Anesthesia: General | Site: Abdomen | Wound class: Clean Contaminated

## 2013-08-17 MED ORDER — OXYCODONE-ACETAMINOPHEN 5-325 MG PO TABS: 1.0000 | ORAL_TABLET | ORAL | Status: DC | PRN

## 2013-08-17 MED ORDER — OXYTOCIN 10 UNIT/ML IJ SOLN
40.0000 [IU] | INTRAVENOUS | Status: DC | PRN
  Administered 2013-08-17: 40 [IU] via INTRAVENOUS

## 2013-08-17 MED ORDER — ONDANSETRON HCL 4 MG/2ML IJ SOLN
4.0000 mg | Freq: Four times a day (QID) | INTRAMUSCULAR | Status: DC | PRN
  Administered 2013-08-17: 4 mg via INTRAVENOUS

## 2013-08-17 MED ORDER — LACTATED RINGERS IV SOLN: 500.0000 mL | INTRAVENOUS | Status: DC | PRN

## 2013-08-17 MED ORDER — LIDOCAINE HCL (CARDIAC) 20 MG/ML IV SOLN
INTRAVENOUS | Status: DC | PRN
  Administered 2013-08-17: 100 mg via INTRAVENOUS

## 2013-08-17 MED ORDER — MIDAZOLAM HCL 2 MG/2ML IJ SOLN
INTRAMUSCULAR | Status: AC
  Filled 2013-08-17: qty 2

## 2013-08-17 MED ORDER — HYDROMORPHONE 0.3 MG/ML IV SOLN
INTRAVENOUS | Status: DC
  Administered 2013-08-17: 4.59 mg via INTRAVENOUS
  Administered 2013-08-17: 12:00:00 via INTRAVENOUS
  Filled 2013-08-17: qty 25

## 2013-08-17 MED ORDER — FLEET ENEMA 7-19 GM/118ML RE ENEM: 1.0000 | ENEMA | RECTAL | Status: DC | PRN

## 2013-08-17 MED ORDER — OXYTOCIN 40 UNITS IN LACTATED RINGERS INFUSION - SIMPLE MED
62.5000 mL/h | INTRAVENOUS | Status: DC
  Filled 2013-08-17: qty 1000

## 2013-08-17 MED ORDER — PROMETHAZINE HCL 25 MG/ML IJ SOLN
25.0000 mg | Freq: Four times a day (QID) | INTRAMUSCULAR | Status: DC | PRN
  Administered 2013-08-17: 25 mg via INTRAMUSCULAR
  Filled 2013-08-17: qty 1

## 2013-08-17 MED ORDER — DIPHENHYDRAMINE HCL 12.5 MG/5ML PO ELIX
12.5000 mg | ORAL_SOLUTION | Freq: Four times a day (QID) | ORAL | Status: DC | PRN
  Filled 2013-08-17: qty 5

## 2013-08-17 MED ORDER — LIDOCAINE HCL (PF) 1 % IJ SOLN
30.0000 mL | INTRAMUSCULAR | Status: DC | PRN
  Filled 2013-08-17: qty 30

## 2013-08-17 MED ORDER — DIPHENHYDRAMINE HCL 50 MG/ML IJ SOLN: 12.5000 mg | Freq: Four times a day (QID) | INTRAMUSCULAR | Status: DC | PRN

## 2013-08-17 MED ORDER — CITRIC ACID-SODIUM CITRATE 334-500 MG/5ML PO SOLN
30.0000 mL | ORAL | Status: DC | PRN
  Administered 2013-08-17: 30 mL via ORAL
  Filled 2013-08-17: qty 15

## 2013-08-17 MED ORDER — DEXTROSE 5 % IV SOLN
500.0000 mg | Freq: Once | INTRAVENOUS | Status: AC
  Administered 2013-08-17: 500 mg via INTRAVENOUS
  Filled 2013-08-17: qty 500

## 2013-08-17 MED ORDER — PHENYLEPHRINE 40 MCG/ML (10ML) SYRINGE FOR IV PUSH (FOR BLOOD PRESSURE SUPPORT): 80.0000 ug | PREFILLED_SYRINGE | INTRAVENOUS | Status: DC | PRN

## 2013-08-17 MED ORDER — MIDAZOLAM HCL 5 MG/5ML IJ SOLN
INTRAMUSCULAR | Status: DC | PRN
  Administered 2013-08-17: 2 mg via INTRAVENOUS

## 2013-08-17 MED ORDER — PROPOFOL INFUSION 10 MG/ML OPTIME: INTRAVENOUS | Status: DC | PRN

## 2013-08-17 MED ORDER — ROCURONIUM BROMIDE 50 MG/5ML IV SOLN
INTRAVENOUS | Status: AC
  Filled 2013-08-17: qty 1

## 2013-08-17 MED ORDER — NALBUPHINE SYRINGE 5 MG/0.5 ML
10.0000 mg | INJECTION | Freq: Four times a day (QID) | INTRAMUSCULAR | Status: DC | PRN
  Administered 2013-08-17: 10 mg via INTRAMUSCULAR
  Filled 2013-08-17: qty 1

## 2013-08-17 MED ORDER — LACTATED RINGERS IV SOLN: 500.0000 mL | Freq: Once | INTRAVENOUS | Status: DC

## 2013-08-17 MED ORDER — SUCCINYLCHOLINE CHLORIDE 20 MG/ML IJ SOLN
INTRAMUSCULAR | Status: DC | PRN
  Administered 2013-08-17: 120 mg via INTRAVENOUS

## 2013-08-17 MED ORDER — SODIUM CHLORIDE 0.9 % IJ SOLN: 9.0000 mL | INTRAMUSCULAR | Status: DC | PRN

## 2013-08-17 MED ORDER — ONDANSETRON HCL 4 MG/2ML IJ SOLN
INTRAMUSCULAR | Status: AC
  Filled 2013-08-17: qty 2

## 2013-08-17 MED ORDER — EPHEDRINE 5 MG/ML INJ: 10.0000 mg | INTRAVENOUS | Status: DC | PRN

## 2013-08-17 MED ORDER — ONDANSETRON HCL 4 MG/2ML IJ SOLN
4.0000 mg | Freq: Four times a day (QID) | INTRAMUSCULAR | Status: DC | PRN
  Filled 2013-08-17: qty 2

## 2013-08-17 MED ORDER — LACTATED RINGERS IV SOLN
INTRAVENOUS | Status: DC
  Administered 2013-08-17 (×4): via INTRAVENOUS

## 2013-08-17 MED ORDER — ACETAMINOPHEN 325 MG PO TABS: 650.0000 mg | ORAL_TABLET | ORAL | Status: DC | PRN

## 2013-08-17 MED ORDER — IBUPROFEN 600 MG PO TABS: 600.0000 mg | ORAL_TABLET | Freq: Four times a day (QID) | ORAL | Status: DC | PRN

## 2013-08-17 MED ORDER — DIPHENHYDRAMINE HCL 50 MG/ML IJ SOLN: 12.5000 mg | INTRAMUSCULAR | Status: DC | PRN

## 2013-08-17 MED ORDER — TERBUTALINE SULFATE 1 MG/ML IJ SOLN: 0.2500 mg | Freq: Once | INTRAMUSCULAR | Status: AC | PRN

## 2013-08-17 MED ORDER — NALBUPHINE SYRINGE 5 MG/0.5 ML
10.0000 mg | INJECTION | INTRAMUSCULAR | Status: DC | PRN
  Administered 2013-08-17: 10 mg via INTRAVENOUS
  Filled 2013-08-17: qty 1

## 2013-08-17 MED ORDER — OXYTOCIN 40 UNITS IN LACTATED RINGERS INFUSION - SIMPLE MED
1.0000 m[IU]/min | INTRAVENOUS | Status: DC
  Administered 2013-08-17: 2 m[IU]/min via INTRAVENOUS

## 2013-08-17 MED ORDER — BUTORPHANOL TARTRATE 1 MG/ML IJ SOLN: 1.0000 mg | INTRAMUSCULAR | Status: DC | PRN

## 2013-08-17 MED ORDER — PROPOFOL INFUSION 10 MG/ML OPTIME
INTRAVENOUS | Status: DC | PRN
  Administered 2013-08-17: 20 mL via INTRAVENOUS

## 2013-08-17 MED ORDER — FENTANYL 2.5 MCG/ML BUPIVACAINE 1/10 % EPIDURAL INFUSION (WH - ANES)
14.0000 mL/h | INTRAMUSCULAR | Status: DC | PRN
  Filled 2013-08-17: qty 125

## 2013-08-17 MED ORDER — ONDANSETRON HCL 4 MG/2ML IJ SOLN
INTRAMUSCULAR | Status: DC | PRN
  Administered 2013-08-17: 4 mg via INTRAVENOUS

## 2013-08-17 MED ORDER — OXYTOCIN 10 UNIT/ML IJ SOLN
INTRAMUSCULAR | Status: AC
  Filled 2013-08-17: qty 4

## 2013-08-17 MED ORDER — FENTANYL CITRATE 0.05 MG/ML IJ SOLN
INTRAMUSCULAR | Status: DC | PRN
  Administered 2013-08-17: 500 ug via INTRAVENOUS

## 2013-08-17 MED ORDER — OXYTOCIN BOLUS FROM INFUSION: 500.0000 mL | INTRAVENOUS | Status: DC

## 2013-08-17 MED ORDER — PENICILLIN G POTASSIUM 5000000 UNITS IJ SOLR
2.5000 10*6.[IU] | INTRAVENOUS | Status: DC
  Administered 2013-08-17 (×4): 2.5 10*6.[IU] via INTRAVENOUS
  Filled 2013-08-17 (×6): qty 2.5

## 2013-08-17 MED ORDER — NALOXONE HCL 0.4 MG/ML IJ SOLN: 0.4000 mg | INTRAMUSCULAR | Status: DC | PRN

## 2013-08-17 MED ORDER — FENTANYL CITRATE 0.05 MG/ML IJ SOLN
INTRAMUSCULAR | Status: AC
  Filled 2013-08-17: qty 10

## 2013-08-17 MED ORDER — PROPOFOL 10 MG/ML IV EMUL
INTRAVENOUS | Status: AC
  Filled 2013-08-17: qty 20

## 2013-08-17 MED ORDER — DEXTROSE 5 % IV SOLN
5.0000 10*6.[IU] | Freq: Once | INTRAVENOUS | Status: AC
  Administered 2013-08-17: 5 10*6.[IU] via INTRAVENOUS
  Filled 2013-08-17: qty 5

## 2013-08-17 MED ORDER — PHENYLEPHRINE 40 MCG/ML (10ML) SYRINGE FOR IV PUSH (FOR BLOOD PRESSURE SUPPORT)
80.0000 ug | PREFILLED_SYRINGE | INTRAVENOUS | Status: DC | PRN
  Filled 2013-08-17: qty 5

## 2013-08-17 MED ORDER — EPHEDRINE 5 MG/ML INJ
10.0000 mg | INTRAVENOUS | Status: DC | PRN
  Filled 2013-08-17: qty 4

## 2013-08-17 SURGICAL SUPPLY — 39 items
BENZOIN TINCTURE PRP APPL 2/3 (GAUZE/BANDAGES/DRESSINGS) ×2 IMPLANT
CANISTER WOUND CARE 500ML ATS (WOUND CARE) IMPLANT
CLAMP CORD UMBIL (MISCELLANEOUS) ×2 IMPLANT
CLOTH BEACON ORANGE TIMEOUT ST (SAFETY) ×2 IMPLANT
CONTAINER PREFILL 10% NBF 15ML (MISCELLANEOUS) IMPLANT
DRAPE LG THREE QUARTER DISP (DRAPES) ×2 IMPLANT
DRSG OPSITE POSTOP 4X10 (GAUZE/BANDAGES/DRESSINGS) ×2 IMPLANT
DRSG VAC ATS LRG SENSATRAC (GAUZE/BANDAGES/DRESSINGS) IMPLANT
DRSG VAC ATS MED SENSATRAC (GAUZE/BANDAGES/DRESSINGS) IMPLANT
DRSG VAC ATS SM SENSATRAC (GAUZE/BANDAGES/DRESSINGS) IMPLANT
DURAPREP 26ML APPLICATOR (WOUND CARE) ×2 IMPLANT
ELECT REM PT RETURN 9FT ADLT (ELECTROSURGICAL) ×2
ELECTRODE REM PT RTRN 9FT ADLT (ELECTROSURGICAL) ×1 IMPLANT
EXTRACTOR VACUUM M CUP 4 TUBE (SUCTIONS) IMPLANT
GLOVE BIO SURGEON STRL SZ 6.5 (GLOVE) ×2 IMPLANT
GOWN STRL REIN XL XLG (GOWN DISPOSABLE) ×4 IMPLANT
KIT ABG SYR 3ML LUER SLIP (SYRINGE) IMPLANT
NEEDLE HYPO 25X5/8 SAFETYGLIDE (NEEDLE) ×2 IMPLANT
NS IRRIG 1000ML POUR BTL (IV SOLUTION) ×2 IMPLANT
PACK C SECTION WH (CUSTOM PROCEDURE TRAY) ×2 IMPLANT
PAD OB MATERNITY 4.3X12.25 (PERSONAL CARE ITEMS) ×2 IMPLANT
RTRCTR C-SECT PINK 25CM LRG (MISCELLANEOUS) ×2 IMPLANT
SCRUB PCMX 4 OZ (MISCELLANEOUS) ×2 IMPLANT
STAPLER VISISTAT 35W (STAPLE) IMPLANT
STRIP CLOSURE SKIN 1/2X4 (GAUZE/BANDAGES/DRESSINGS) ×2 IMPLANT
SUT MNCRL 0 VIOLET CTX 36 (SUTURE) ×2 IMPLANT
SUT MNCRL AB 3-0 PS2 27 (SUTURE) ×2 IMPLANT
SUT MONOCRYL 0 CTX 36 (SUTURE) ×2
SUT PDS AB 0 CTX 36 PDP370T (SUTURE) ×2 IMPLANT
SUT PLAIN 0 NONE (SUTURE) IMPLANT
SUT VIC AB 0 CTXB 36 (SUTURE) ×4 IMPLANT
SUT VIC AB 2-0 CT1 (SUTURE) ×2 IMPLANT
SUT VIC AB 2-0 CT1 27 (SUTURE) ×1
SUT VIC AB 2-0 CT1 TAPERPNT 27 (SUTURE) ×1 IMPLANT
SUT VIC AB 2-0 SH 27 (SUTURE)
SUT VIC AB 2-0 SH 27XBRD (SUTURE) IMPLANT
TOWEL OR 17X24 6PK STRL BLUE (TOWEL DISPOSABLE) ×2 IMPLANT
TRAY FOLEY CATH 14FR (SET/KITS/TRAYS/PACK) ×2 IMPLANT
WATER STERILE IRR 1000ML POUR (IV SOLUTION) ×2 IMPLANT

## 2013-08-17 NOTE — Progress Notes (Addendum)
Patient ID: Jamelle Haring, female   DOB: 07-30-1981, 32 y.o.   MRN: [id number] Reiko Vinje is a 32 y.o. G1P0 at [redacted]w[redacted]d by LMP admitted for induction of labor due to gestational hypertension.  Subjective: Pushing  Objective: BP 143/90  Pulse 100  Temp(Src) 98.6 F (37 C) (Oral)  Resp 20  Ht 5' 2.75" (1.594 m)  Wt 212 lb (96.163 kg)  BMI 37.85 kg/m2  SpO2 94%  LMP 11/08/2012      FHT:  FHR: 130 bpm, variability: moderate,  accelerations:  Present,  decelerations:  Absent UC:   irregular, every 5 minutes SVE:   Dilation: 10 Effacement (%): 100 Station: +1 Exam by:: V. Mensah LOA w/molding; prominent ischial spines Results for orders placed during the hospital encounter of 08/16/13 (from the past 24 hour(s))  CBC     Status: Abnormal   Collection Time    08/17/13  3:15 AM      Result Value Range   WBC 12.4 (*) 4.0 - 10.5 K/uL   RBC 4.71  3.87 - 5.11 MIL/uL   Hemoglobin 13.9  12.0 - 15.0 g/dL   HCT 40.9  81.1 - 91.4 %   MCV 85.6  78.0 - 100.0 fL   MCH 29.5  26.0 - 34.0 pg   MCHC 34.5  30.0 - 36.0 g/dL   RDW 78.2  95.6 - 21.3 %   Platelets 122 (*) 150 - 400 K/uL  RPR     Status: None   Collection Time    08/17/13  3:15 AM      Result Value Range   RPR NON REACTIVE  NON REACTIVE  COMPREHENSIVE METABOLIC PANEL     Status: Abnormal   Collection Time    08/17/13  3:15 AM      Result Value Range   Sodium 135  135 - 145 mEq/L   Potassium 4.7  3.5 - 5.1 mEq/L   Chloride 100  96 - 112 mEq/L   CO2 19  19 - 32 mEq/L   Glucose, Bld 78  70 - 99 mg/dL   BUN 8  6 - 23 mg/dL   Creatinine, Ser 0.86  0.50 - 1.10 mg/dL   Calcium 9.9  8.4 - 57.8 mg/dL   Total Protein 6.9  6.0 - 8.3 g/dL   Albumin 3.0 (*) 3.5 - 5.2 g/dL   AST 32  0 - 37 U/L   ALT 17  0 - 35 U/L   Alkaline Phosphatase 115  39 - 117 U/L   Total Bilirubin 0.4  0.3 - 1.2 mg/dL   GFR calc non Af Amer >90  >90 mL/min   GFR calc Af Amer >90  >90 mL/min  LACTATE DEHYDROGENASE     Status: Abnormal   Collection Time    08/17/13  3:15 AM      Result Value Range   LDH 289 (*) 94 - 250 U/L  TYPE AND SCREEN     Status: None   Collection Time    08/17/13  8:57 AM      Result Value Range   ABO/RH(D) A NEG     Antibody Screen NEG     Sample Expiration 08/20/2013    APTT     Status: None   Collection Time    08/17/13  1:35 PM      Result Value Range   aPTT 27  24 - 37 seconds  PROTIME-INR     Status: None   Collection Time  08/17/13  1:35 PM      Result Value Range   Prothrombin Time 12.4  11.6 - 15.2 seconds   INR 0.94  0.00 - 1.49  FIBRINOGEN     Status: Abnormal   Collection Time    08/17/13  1:35 PM      Result Value Range   Fibrinogen 499 (*) 204 - 475 mg/dL  FACTOR 8 ASSAY     Status: Abnormal   Collection Time    08/17/13  1:35 PM      Result Value Range   Coagulation Factor VIII 182 (*) 73 - 140 %    Assessment / Plan: Undergoing IOL secondary to gestational hypertension Possible history of von Willebrand's disesase Labor: arrest of descent; narrow midpelvis Preeclampsia:  see above Fetal Wellbeing:  Category I Pain Control:  IV narcotics I/D:  n/a Anticipated MOD:  C/D; informed consent obtained    JACKSON-MOORE,Chadric Kimberley A 08/17/2013, 10:31 PM

## 2013-08-17 NOTE — Progress Notes (Addendum)
Patient ID: Courtney [name], female   DOB: 06-Sep-1981, 32 y.o.   MRN: [id number] Courtney [name] is [name] 31 y.o. G1P0 at [redacted]w[redacted]d by LMP admitted for induction of labor due to gestational hypertension.  Subjective: Uncomfortable  Objective: BP 150/80  Pulse 68  Temp(Src) 98 F (36.7 C) (Oral)  Resp 20  Ht 5' 2.75" (1.594 m)  Wt 212 lb (96.163 kg)  BMI 37.85 kg/m2  SpO2 97%  LMP 11/08/2012      FHT:  FHR: 130 bpm, variability: moderate,  accelerations:  Present,  decelerations:  Absent UC:   irregular, every 5 minutes SVE:   Dilation: 4.5 Effacement (%): 100 Station: -1 Exam by:: Dr Tamela Oddi AROM for clear fluid   Assessment / Plan: Undergoing IOL secondary to gestational hypertension Possible history of von Willebrand's disesase Labor: latent labor Preeclampsia:  see above Fetal Wellbeing:  Category I Pain Control:  IV narcotics I/D:  n/[name] Anticipated MOD:  NSVD Hematology called.  Testing for von willebrand's disease ordered.  Consult with Hematology when labs available.  Avoid invasive procedures ie epidural, FSE, operative vaginal delivery for now. Marland Kitchen  JACKSON-MOORE,Courtney [name] 08/17/2013, 1:28 PM

## 2013-08-17 NOTE — Consult Note (Signed)
Neonatology Note:   Attendance at C-section:    I was asked by Dr. Tamela Oddi to attend this primary C/S at term due to failure of descent and possible Von Willebrand's disease. The mother is a G1P0 A neg, GBS positive with gestational hypertension. ROM 11 hours prior to delivery, fluid clear. Infant vigorous with good spontaneous cry and tone. Needed only bulb suctioning. Ap 9/10. Lungs clear to ausc in DR. To CN to care of Pediatrician.   Doretha Sou, MD

## 2013-08-17 NOTE — Progress Notes (Signed)
To room 165

## 2013-08-17 NOTE — Anesthesia Preprocedure Evaluation (Addendum)
Anesthesia Evaluation  Patient identified by MRN, date of birth, ID band Patient awake    Reviewed: Allergy & Precautions, H&P , NPO status , Patient's Chart, lab work & pertinent test results, reviewed documented beta blocker date and time   History of Anesthesia Complications Negative for: history of anesthetic complications  Airway Mallampati: III TM Distance: >3 FB Neck ROM: full    Dental  (+) Teeth Intact   Pulmonary neg pulmonary ROS,  breath sounds clear to auscultation        Cardiovascular hypertension (CHTN - on meds prior to pregnancy, none in pregnancy), Rhythm:regular Rate:Normal     Neuro/Psych negative neurological ROS  negative psych ROS   GI/Hepatic Neg liver ROS, GERD- (tums)  ,  Endo/Other  Obese BMI 37.9  Renal/GU negative Renal ROS     Musculoskeletal   Abdominal   Peds  Hematology  (+) Blood dyscrasia (vonWillebrand's disease - diagnosed by Dr Clearance Coots - has never seen a hematologist or had further testing, platelets 122 today), ,   Anesthesia Other Findings   Reproductive/Obstetrics (+) Pregnancy (arrest of descent --> C/S)                          Anesthesia Physical Anesthesia Plan  ASA: II and emergent  Anesthesia Plan: General ETT, Rapid Sequence and Cricoid Pressure   Post-op Pain Management:    Induction:   Airway Management Planned:   Additional Equipment:   Intra-op Plan:   Post-operative Plan:   Informed Consent: I have reviewed the patients History and Physical, chart, labs and discussed the procedure including the risks, benefits and alternatives for the proposed anesthesia with the patient or authorized representative who has indicated his/her understanding and acceptance.     Plan Discussed with: Surgeon and CRNA  Anesthesia Plan Comments: (Has not had complete work-up for vWD, no clotting studies, has not seen a hematologist for  recommendations. At this time, do no feel neuraxial anesthesia is appropriate with unknown clotting ability.  Jasmine December, MD)       Anesthesia Quick Evaluation

## 2013-08-17 NOTE — Progress Notes (Addendum)
Courtney [name] is a 32 y.o. G1P0 at [redacted]w[redacted]d by LMP admitted for induction of labor due to gestational hypertension.  Subjective: Comfortable  Objective: BP 141/71  Pulse 69  Temp(Src) 98 F (36.7 C) (Oral)  Resp 18  Ht 5' 2.75" (1.594 m)  Wt 212 lb (96.163 kg)  BMI 37.85 kg/m2  LMP 11/08/2012      FHT:  FHR: 130 bpm, variability: moderate,  accelerations:  Present,  decelerations:  Absent UC:   irregular, every 5 minutes SVE:   Dilation: 3.5 Effacement (%): 90 Station: -1 Exam by:: Ferne Coe RN  Labs: Lab Results  Component Value Date   WBC 12.4* 08/17/2013   HGB 13.9 08/17/2013   HCT 40.3 08/17/2013   MCV 85.6 08/17/2013   PLT 122* 08/17/2013    Assessment / Plan: Undergoing IOL secondary to gestational hypertension Possible history of von Willebrand's disesase Labor: latent labor Preeclampsia:  see above Fetal Wellbeing:  Category I Pain Control:  IV narcotics I/D:  n/a Anticipated MOD:  NSVD  .  JACKSON-MOORE,Darrien Laakso A 08/17/2013, 9:21 AM

## 2013-08-18 ENCOUNTER — Encounter (HOSPITAL_COMMUNITY): Payer: Self-pay | Admitting: General Practice

## 2013-08-18 LAB — CBC
Hemoglobin: 10.7 g/dL — ABNORMAL LOW (ref 12.0–15.0)
MCV: 86.4 fL (ref 78.0–100.0)
Platelets: 98 10*3/uL — ABNORMAL LOW (ref 150–400)
RBC: 3.67 MIL/uL — ABNORMAL LOW (ref 3.87–5.11)
WBC: 16.9 10*3/uL — ABNORMAL HIGH (ref 4.0–10.5)

## 2013-08-18 MED ORDER — NEOSTIGMINE METHYLSULFATE 1 MG/ML IJ SOLN
INTRAMUSCULAR | Status: AC
  Filled 2013-08-18: qty 1

## 2013-08-18 MED ORDER — OXYCODONE-ACETAMINOPHEN 5-325 MG PO TABS
1.0000 | ORAL_TABLET | ORAL | Status: DC | PRN
  Administered 2013-08-19: 1 via ORAL
  Filled 2013-08-18: qty 1

## 2013-08-18 MED ORDER — NALOXONE HCL 0.4 MG/ML IJ SOLN: 0.4000 mg | INTRAMUSCULAR | Status: DC | PRN

## 2013-08-18 MED ORDER — SIMETHICONE 80 MG PO CHEW
80.0000 mg | CHEWABLE_TABLET | ORAL | Status: DC | PRN
  Administered 2013-08-18 – 2013-08-19 (×6): 80 mg via ORAL

## 2013-08-18 MED ORDER — ACETAMINOPHEN 160 MG/5ML PO SOLN: 975.0000 mg | Freq: Four times a day (QID) | ORAL | Status: DC | PRN

## 2013-08-18 MED ORDER — LACTATED RINGERS IV SOLN
INTRAVENOUS | Status: DC
  Administered 2013-08-18 (×2): via INTRAVENOUS

## 2013-08-18 MED ORDER — CEFAZOLIN SODIUM-DEXTROSE 2-3 GM-% IV SOLR: 2.0000 g | INTRAVENOUS | Status: DC

## 2013-08-18 MED ORDER — FENTANYL CITRATE 0.05 MG/ML IJ SOLN
25.0000 ug | INTRAMUSCULAR | Status: DC | PRN
  Administered 2013-08-18 (×2): 50 ug via INTRAVENOUS

## 2013-08-18 MED ORDER — SODIUM CHLORIDE 0.9 % IJ SOLN: 3.0000 mL | INTRAMUSCULAR | Status: DC | PRN

## 2013-08-18 MED ORDER — SODIUM CHLORIDE 0.9 % IJ SOLN: 9.0000 mL | INTRAMUSCULAR | Status: DC | PRN

## 2013-08-18 MED ORDER — TETANUS-DIPHTH-ACELL PERTUSSIS 5-2.5-18.5 LF-MCG/0.5 IM SUSP
0.5000 mL | Freq: Once | INTRAMUSCULAR | Status: AC
  Administered 2013-08-19: 0.5 mL via INTRAMUSCULAR
  Filled 2013-08-18 (×2): qty 0.5

## 2013-08-18 MED ORDER — GLYCOPYRROLATE 0.2 MG/ML IJ SOLN
INTRAMUSCULAR | Status: AC
  Filled 2013-08-18: qty 3

## 2013-08-18 MED ORDER — DIBUCAINE 1 % RE OINT: 1.0000 "application " | TOPICAL_OINTMENT | RECTAL | Status: DC | PRN

## 2013-08-18 MED ORDER — PRENATAL MULTIVITAMIN CH
1.0000 | ORAL_TABLET | Freq: Every day | ORAL | Status: DC
  Administered 2013-08-18 – 2013-08-20 (×3): 1 via ORAL
  Filled 2013-08-18 (×3): qty 1

## 2013-08-18 MED ORDER — LANOLIN HYDROUS EX OINT: 1.0000 "application " | TOPICAL_OINTMENT | CUTANEOUS | Status: DC | PRN

## 2013-08-18 MED ORDER — HYDROMORPHONE 0.3 MG/ML IV SOLN: INTRAVENOUS | Status: DC

## 2013-08-18 MED ORDER — ONDANSETRON HCL 4 MG/2ML IJ SOLN: 4.0000 mg | Freq: Four times a day (QID) | INTRAMUSCULAR | Status: DC | PRN

## 2013-08-18 MED ORDER — 0.9 % SODIUM CHLORIDE (POUR BTL) OPTIME
TOPICAL | Status: DC | PRN
  Administered 2013-08-17: 1000 mL

## 2013-08-18 MED ORDER — MEPERIDINE HCL 25 MG/ML IJ SOLN
6.2500 mg | INTRAMUSCULAR | Status: DC | PRN
  Administered 2013-08-18: 6.25 mg via INTRAVENOUS

## 2013-08-18 MED ORDER — IBUPROFEN 600 MG PO TABS
600.0000 mg | ORAL_TABLET | Freq: Four times a day (QID) | ORAL | Status: DC
  Administered 2013-08-18 – 2013-08-20 (×8): 600 mg via ORAL
  Filled 2013-08-18 (×8): qty 1

## 2013-08-18 MED ORDER — SENNOSIDES-DOCUSATE SODIUM 8.6-50 MG PO TABS
2.0000 | ORAL_TABLET | Freq: Every day | ORAL | Status: DC
  Administered 2013-08-18 – 2013-08-19 (×2): 2 via ORAL

## 2013-08-18 MED ORDER — FENTANYL CITRATE 0.05 MG/ML IJ SOLN
INTRAMUSCULAR | Status: AC
  Filled 2013-08-18: qty 2

## 2013-08-18 MED ORDER — ZOLPIDEM TARTRATE 5 MG PO TABS: 5.0000 mg | ORAL_TABLET | Freq: Every evening | ORAL | Status: DC | PRN

## 2013-08-18 MED ORDER — MEPERIDINE HCL 25 MG/ML IJ SOLN
INTRAMUSCULAR | Status: AC
  Filled 2013-08-18: qty 1

## 2013-08-18 MED ORDER — DIPHENHYDRAMINE HCL 50 MG/ML IJ SOLN: 12.5000 mg | Freq: Four times a day (QID) | INTRAMUSCULAR | Status: DC | PRN

## 2013-08-18 MED ORDER — GLYCOPYRROLATE 0.2 MG/ML IJ SOLN
INTRAMUSCULAR | Status: DC | PRN
  Administered 2013-08-18: .4 mg via INTRAVENOUS
  Administered 2013-08-18: .6 mg via INTRAVENOUS

## 2013-08-18 MED ORDER — DIPHENHYDRAMINE HCL 50 MG/ML IJ SOLN: 25.0000 mg | INTRAMUSCULAR | Status: DC | PRN

## 2013-08-18 MED ORDER — WITCH HAZEL-GLYCERIN EX PADS: 1.0000 "application " | MEDICATED_PAD | CUTANEOUS | Status: DC | PRN

## 2013-08-18 MED ORDER — NALBUPHINE SYRINGE 5 MG/0.5 ML
5.0000 mg | INJECTION | INTRAMUSCULAR | Status: DC | PRN
  Filled 2013-08-18: qty 1

## 2013-08-18 MED ORDER — FERROUS SULFATE 325 (65 FE) MG PO TABS
325.0000 mg | ORAL_TABLET | Freq: Two times a day (BID) | ORAL | Status: DC
  Administered 2013-08-18 (×2): 325 mg via ORAL
  Filled 2013-08-18 (×2): qty 1

## 2013-08-18 MED ORDER — OXYTOCIN 40 UNITS IN LACTATED RINGERS INFUSION - SIMPLE MED: 62.5000 mL/h | INTRAVENOUS | Status: AC

## 2013-08-18 MED ORDER — ROCURONIUM BROMIDE 100 MG/10ML IV SOLN
INTRAVENOUS | Status: DC | PRN
  Administered 2013-08-18: 20 mg via INTRAVENOUS

## 2013-08-18 MED ORDER — NEOSTIGMINE METHYLSULFATE 1 MG/ML IJ SOLN
INTRAMUSCULAR | Status: DC | PRN
  Administered 2013-08-18: 2 mg via INTRAVENOUS
  Administered 2013-08-18: 3 mg via INTRAVENOUS

## 2013-08-18 MED ORDER — DIPHENHYDRAMINE HCL 25 MG PO CAPS: 25.0000 mg | ORAL_CAPSULE | ORAL | Status: DC | PRN

## 2013-08-18 MED ORDER — MAGNESIUM HYDROXIDE 400 MG/5ML PO SUSP: 30.0000 mL | ORAL | Status: DC | PRN

## 2013-08-18 MED ORDER — MEASLES, MUMPS & RUBELLA VAC ~~LOC~~ INJ
0.5000 mL | INJECTION | Freq: Once | SUBCUTANEOUS | Status: DC
  Filled 2013-08-18: qty 0.5

## 2013-08-18 MED ORDER — DIPHENHYDRAMINE HCL 50 MG/ML IJ SOLN: 12.5000 mg | INTRAMUSCULAR | Status: DC | PRN

## 2013-08-18 MED ORDER — DIPHENHYDRAMINE HCL 12.5 MG/5ML PO ELIX
12.5000 mg | ORAL_SOLUTION | Freq: Four times a day (QID) | ORAL | Status: DC | PRN
  Filled 2013-08-18: qty 5

## 2013-08-18 NOTE — Op Note (Signed)
Cesarean Section Procedure Note   Courtney [name]   08/16/2013 - 08/18/2013  Indications: Arrest of descent   Pre-operative Diagnosis: Intrauterine pregnancy at [redacted]w[redacted]d, possible maternal Von Willebrand's disease, arrest of descent.   Post-operative Diagnosis: Same   Surgeon: Antionette Char A  Assistants: none  Anesthesia: general  Procedure Details:  The patient was seen in the Holding Room. The risks, benefits, complications, treatment options, and expected outcomes were discussed with the patient. The patient concurred with the proposed plan, giving informed consent. The patient was identified as Paramedic and the procedure verified as C-Section Delivery. A Time Out was held and the above information confirmed.  After induction of anesthesia, the patient was draped and prepped in the usual sterile manner. A transverse incision was made and carried down through the subcutaneous tissue to the fascia. The fascial incision was made and extended transversely. The fascia was separated from the underlying rectus tissue superiorly. The peritoneum was identified and entered. The peritoneal incision was extended longitudinally. The utero-vesical peritoneal reflection was incised transversely and the bladder flap was bluntly freed from the lower uterine segment. A low transverse uterine incision was made. Delivered from cephalic presentation was a  living newborn female infant(s). APGAR (1 MIN): 9   APGAR (5 MINS): 10      A cord ph was not sent. The umbilical cord was clamped and cut cord. A sample was obtained for evaluation. Cord blood was also obtained for private banking.  The placenta was removed Intact and appeared normal.  The uterine incision was closed with running locked sutures of 1-0 Monocryl. A second imbricating layer of the same suture was placed.  Hemostasis was observed.  The parieto peritoneum was closed in a running fashion with 2-0 Vicryl.  The fascia was then  reapproximated with running sutures of 0 Vicryl.  A running suture of 2-0 Vicryl was placed in the subcutaneous layer.  The skin was closed with suture.  Instrument, sponge, and needle counts were correct prior the abdominal closure and were correct at the conclusion of the case.    Findings:  See above   Estimated Blood Loss: 800 ml  Total IV Fluids: per Anesthesiology   Urine Output: per Anesthesiology; slightly blood-tinged at the start of the procedure  Specimens: Placenta to pathology  Complications: no complications  Disposition: PACU - hemodynamically stable.  Maternal Condition: stable   Baby condition / location:  NICU    Signed: Surgeon(s): Antionette Char, MD

## 2013-08-18 NOTE — Anesthesia Postprocedure Evaluation (Signed)
  Anesthesia Post-op Note  Anesthesia Post Note  Patient: Courtney [name]  Procedure(s) Performed: Procedure(s) (LRB): Primary Cesarean Section Delivery Baby Boy @ 2348, Apgars 9/10  (N/A)  Anesthesia type: General  Patient location: PACU  Post pain: Pain level controlled  Post assessment: Post-op Vital signs reviewed  Last Vitals:  Filed Vitals:   08/18/13 0253  BP: 139/79  Pulse: 90  Temp: 36.9 C  Resp: 20    Post vital signs: Reviewed  Level of consciousness: sedated  Complications: No apparent anesthesia complications

## 2013-08-18 NOTE — Transfer of Care (Signed)
Immediate Anesthesia Transfer of Care Note  Patient: Courtney [name]  Procedure(s) Performed: Procedure(s): Primary Cesarean Section Delivery Baby Boy @ 2348, Apgars 9/10  (N/A)  Patient Location: PACU  Anesthesia Type:General  Level of Consciousness: awake, alert  and oriented  Airway & Oxygen Therapy: Patient Spontanous Breathing and Patient connected to nasal cannula oxygen  Post-op Assessment: Report given to PACU RN and Post -op Vital signs reviewed and stable  Post vital signs: Reviewed and stable  Complications: No apparent anesthesia complications

## 2013-08-18 NOTE — Progress Notes (Signed)
Subjective: Postpartum Day 0: Cesarean Delivery Patient reports tolerating PO.    Objective: Vital signs in last 24 hours: Temp:  [97.9 F (36.6 C)-98.8 F (37.1 C)] 98 F (36.7 C) (09/04 0900) Pulse Rate:  [64-112] 75 (09/04 0900) Resp:  [15-22] 18 (09/04 0900) BP: (121-163)/(58-116) 126/67 mmHg (09/04 0900) SpO2:  [94 %-100 %] 99 % (09/04 0900)  Physical Exam:  General: alert and no distress Lochia: appropriate Uterine Fundus: firm Incision: healing well DVT Evaluation: No evidence of DVT seen on physical exam.   Recent Labs  08/17/13 0315 08/18/13 0628  HGB 13.9 10.7*  HCT 40.3 31.7*    Assessment/Plan: Status post Cesarean section. Doing well postoperatively.  Continue current care.  HARPER,CHARLES A 08/18/2013, 9:25 AM

## 2013-08-18 NOTE — Anesthesia Postprocedure Evaluation (Signed)
Anesthesia Post Note  Patient: Courtney [name]  Procedure(s) Performed: Procedure(s) (LRB): Primary Cesarean Section Delivery Baby Boy @ 2348, Apgars 9/10  (N/A)  Anesthesia type: General  Patient location: Mother/Baby  Post pain: Pain level controlled  Post assessment: Post-op Vital signs reviewed  Last Vitals:  Filed Vitals:   08/18/13 0635  BP: 133/75  Pulse: 99  Temp:   Resp:     Post vital signs: Reviewed  Level of consciousness: awake and alert   Complications: No apparent anesthesia complications

## 2013-08-19 ENCOUNTER — Encounter (HOSPITAL_COMMUNITY): Payer: Self-pay | Admitting: Obstetrics & Gynecology

## 2013-08-19 LAB — CBC
HCT: 30.6 % — ABNORMAL LOW (ref 36.0–46.0)
Hemoglobin: 10.3 g/dL — ABNORMAL LOW (ref 12.0–15.0)
MCHC: 33.7 g/dL (ref 30.0–36.0)
MCV: 87.4 fL (ref 78.0–100.0)

## 2013-08-19 LAB — FACTOR 8 RISTOCETIN COFACTOR: Ristocetin Co-factor, Plasma: 80 % (ref 42–200)

## 2013-08-19 MED ORDER — DOCUSATE SODIUM 283 MG RE ENEM: 1.0000 | ENEMA | Freq: Once | RECTAL | Status: DC

## 2013-08-19 MED ORDER — BISACODYL 10 MG RE SUPP
10.0000 mg | Freq: Once | RECTAL | Status: AC
  Administered 2013-08-19: 10 mg via RECTAL
  Filled 2013-08-19: qty 1

## 2013-08-19 NOTE — Lactation Note (Signed)
This note was copied from the chart of Courtney [name]. Lactation Consultation Note  Mother states the baby is breastfeeding "better". He is now awake and showing cues to eat. He had difficulty maintaining his latch on the breast due to shallow latch. Suck training exercises done. Mother can express colostrum by hand. Several attempts were made and then baby latched well and fed in a rhythmic pattern. Mother has not been using the nipple shield and prefers to latch without the shield. LC will follow tomorrow as needed. To call RN for assistance as needed.  Patient Name: Courtney [name] ZOXWR'U Date: 08/19/2013     Maternal Data    Feeding Feeding Type: Breast Milk Length of feed: 10 min  LATCH Score/Interventions                      Lactation Tools Discussed/Used     Consult Status      Christella Hartigan M 08/19/2013, 5:27 PM

## 2013-08-19 NOTE — Progress Notes (Signed)
Subjective: Postpartum Day 2: Cesarean Delivery Patient reports tolerating PO, + flatus and no problems voiding.    Objective: Vital signs in last 24 hours: Temp:  [97.9 F (36.6 C)-98.3 F (36.8 C)] 98.2 F (36.8 C) (09/05 0525) Pulse Rate:  [75-96] 80 (09/05 0525) Resp:  [18] 18 (09/05 0525) BP: (113-129)/(65-72) 113/72 mmHg (09/05 0525) SpO2:  [98 %-99 %] 99 % (09/04 2015)  Physical Exam:  General: alert and no distress Lochia: appropriate Uterine Fundus: firm Incision: healing well DVT Evaluation: No evidence of DVT seen on physical exam.   Recent Labs  08/17/13 0315 08/18/13 0628  HGB 13.9 10.7*  HCT 40.3 31.7*    Assessment/Plan: Status post Cesarean section. Doing well postoperatively.  Continue current care.  Erskin Zinda A 08/19/2013, 8:44 AM

## 2013-08-19 NOTE — Progress Notes (Signed)
Ibuprofen held due to platelets <100,000

## 2013-08-20 MED ORDER — NORETHINDRONE 0.35 MG PO TABS: 1.0000 | ORAL_TABLET | Freq: Every day | ORAL | Status: DC

## 2013-08-20 MED ORDER — OXYCODONE-ACETAMINOPHEN 5-325 MG PO TABS: 1.0000 | ORAL_TABLET | ORAL | Status: DC | PRN

## 2013-08-20 NOTE — Discharge Summary (Signed)
  Obstetric Discharge Summary Reason for Admission: onset of labor Prenatal Procedures: none Intrapartum Procedures: cesarean: low cervical, transverse Postpartum Procedures: none Complications-Operative and Postpartum: none  Hemoglobin  Date Value Range Status  08/19/2013 10.3* 12.0 - 15.0 g/dL Final  1/61/0960 45.4   Final     HCT  Date Value Range Status  08/19/2013 30.6* 36.0 - 46.0 % Final  01/06/2013 42   Final    Physical Exam:  General: alert Lochia: appropriate Uterine: firm Incision: clean, dry and intact DVT Evaluation: No evidence of DVT seen on physical exam.  Discharge Diagnoses: Active Problems:   Cesarean delivery delivered   Discharge Information: Date: 08/20/2013 Activity: pelvic rest Diet: routine Medications:  Prior to Admission medications   Medication Sig Start Date End Date Taking? Authorizing Provider  calcium carbonate (TUMS - DOSED IN MG ELEMENTAL CALCIUM) 500 MG chewable tablet Chew 1 tablet by mouth daily as needed for heartburn.   Yes Historical Provider, MD  Prenat w/o [name] (CITRANATAL HARMONY) 27-1-250 MG CAPS Take 1 capsule by mouth daily. 04/05/13  Yes Brock Bad, MD  norethindrone (ORTHO MICRONOR) 0.35 MG tablet Take 1 tablet (0.35 mg total) by mouth daily. 08/20/13   Antionette Char, MD  oxyCODONE-acetaminophen (PERCOCET/ROXICET) 5-325 MG per tablet Take 1-2 tablets by mouth every 4 (four) hours as needed. 08/20/13   Antionette Char, MD    Condition: stable Instructions: refer to routine discharge instructions Discharge to: home Follow-up Information   Follow up with HARPER,CHARLES A, MD.   Specialty:  Obstetrics and Gynecology   Contact information:   [address] [place] [address] [phone]       Newborn Data:  Live born female  Birth Weight: 8 lb 4.6 oz (3759 g) APGAR: 9, 10   Home with mother.  Courtney [name],Courtney [name] 08/20/2013, 1:19 PM

## 2013-08-20 NOTE — Lactation Note (Signed)
This note was copied from the chart of Courtney Donesha Venzen-Cherry. Lactation Consultation Note   Follow up consult with this mom and baby. Mom is latching baby well in football hold.The baby was fed 7 mls of  EBM about 2 hours previous, and did not want to feed, . I explained to mom that cuing can also mean "hold me ", if a baby is not hungry. Mom did both spoon and finger feeding with cured tip syringe last night. She has a DEP at home. She plans to feed on cue, and to express and spoon or finger feed if baby will not latch. She is aware of the amount of wet and dirty diapers to watch for. She has an outpatient appointment for 9/11 at  1 Pm.  Patient Name: Courtney [name] ZOXWR'U Date: 08/20/2013 Reason for consult: Follow-up assessment   Maternal Data    Feeding Feeding Type: Breast Milk  LATCH Score/Interventions Latch: Repeated attempts needed to sustain latch, nipple held in mouth throughout feeding, stimulation needed to elicit sucking reflex. Intervention(s): Skin to skin;Waking techniques Intervention(s): Adjust position;Assist with latch  Audible Swallowing: None Intervention(s): Skin to skin  Type of Nipple: Everted at rest and after stimulation  Comfort (Breast/Nipple): Filling, red/small blisters or bruises, mild/mod discomfort  Problem noted: Mild/Moderate discomfort Interventions (Mild/moderate discomfort): Comfort gels  Hold (Positioning): No assistance needed to correctly position infant at breast. Intervention(s): Breastfeeding basics reviewed;Support Pillows;Position options;Skin to skin  LATCH Score: 6  Lactation Tools Discussed/Used     Consult Status Consult Status: Follow-up Date: 08/25/13 (out patient lactation appointment a 1 pm) Follow-up type: Out-patient    Alfred Levins 08/20/2013, 9:46 AM

## 2013-08-22 ENCOUNTER — Ambulatory Visit (INDEPENDENT_AMBULATORY_CARE_PROVIDER_SITE_OTHER): Payer: 59 | Admitting: Obstetrics

## 2013-08-22 NOTE — Progress Notes (Signed)
Subjective:     Courtney [name] is a 32 y.o. female who presents for a postpartum visit. She is 5 days postpartum following a low cervical transverse Cesarean section. I have fully reviewed the prenatal and intrapartum course. The delivery was at 40.2 gestational weeks. Outcome: primary cesarean section, low transverse incision. Anesthesia: general. Postpartum course has been noormal. Baby's course has been normal. Baby is feeding by breast. Bleeding thin lochia. Bowel function is abnormal: constipation. Bladder function is normal. Patient is not sexually active. Contraception method is abstinence. Postpartum depression screening: negative. Patient removed her dressing- it is tearing her skin.  The following portions of the patient's history were reviewed and updated as appropriate: allergies, current medications, past family history, past medical history, past social history, past surgical history and problem list.  Review of Systems Pertinent items are noted in HPI.   Objective:    BP 148/84  Pulse 98  Temp(Src) 98.6 F (37 C)  Wt 195 lb (88.451 kg)  BMI 34.81 kg/m2  LMP 11/08/2012  Breastfeeding? Yes  General:  alert and no distress Abdomen:  Bandage dry.  Removed.  Incision C, D, I and NT.    Assessment:     Normal postpartum exam. Pap smear not done at today's visit.   Plan:    1. Contraception: abstinence 2. Routine PP instructions given. 3. Follow up in: 2 weeks or as needed.

## 2013-08-24 ENCOUNTER — Encounter: Payer: 59 | Admitting: Obstetrics

## 2013-08-25 ENCOUNTER — Ambulatory Visit: Payer: Self-pay

## 2013-08-25 NOTE — Lactation Note (Signed)
This note was copied from the chart of [facility]. Adult Lactation Consultation Outpatient Visit Note  Patient Name: Courtney [name] Date of Birth: 08/17/2015 Gestational Age at Delivery: Unknown Type of Delivery:   Breastfeeding History: Frequency of Breastfeeding: Has been bottle feeding formula and EBM for the last 24 hours Length of Feeding:  q3 hours Voids: QS- Had 2 voids while he was here Stools: QS  Supplementing / Method: Pumping:  Type of Pump: Medela   Frequency:q 3 hours  Volume:  2 oz now- last 24 hours  Comments:    Consultation Evaluation:  Initial Feeding Assessment: Pre-feed Weight: 8- 1.3  3666g Post-feed Weight: 8- 2.2  3692g Amount Transferred: 26 cc's Comments: Emry nursed for 15 minutes on the left breast in cradle hold. He wants to tuck bottom lip under- when adjusted mom reports that feels fine. Reviewed wide open mouth and keeping the baby close to the breast throughout the feeding.   Additional Feeding Assessment: Diaper change Pre-feed Weight: 8- 1.2  3662g Post-feed Weight: 8- 1.6  3676g Amount Transferred: 14 cc's Comments: Assisted mom in football position- changing positions may help nipples    Total Breast milk Transferred this Visit: 48 cc's Total Supplement Given: 0  Additional Interventions: Reviewed basic teaching. No questions at present. To call prn  Follow-Up  With Ped To call here prn    Pamelia Hoit 08/25/2013, 2:11 PM

## 2013-08-28 NOTE — H&P (Signed)
Courtney [name] is a 32 y.o. female presenting for contractions. Maternal Medical History:  Reason for admission: Contractions.  Nausea.  Fetal activity: Perceived fetal activity is normal.    Prenatal complications: PIH.   Prenatal Complications - Diabetes: none.    OB History   Grav Para Term Preterm Abortions TAB SAB Ect Mult Living   1 1 1       1      Past Medical History  Diagnosis Date  . Hypertension   . Von Willebrand disease    Past Surgical History  Procedure Laterality Date  . Hemmeroid    . Gynecologic cryosurgery    . Cesarean section N/A 08/17/2013    Procedure: Primary Cesarean Section Delivery Baby Boy @ 2348, Apgars 9/10 ;  Surgeon: Antionette Char, MD;  Location: [facility];  Service: Obstetrics;  Laterality: N/A;   Family History: family history includes Cancer in her maternal grandfather and paternal grandfather; Diabetes in her maternal grandmother and paternal grandmother; Emphysema in her maternal grandfather; Hypertension in her maternal grandmother and paternal grandmother. Social History:  reports that she has never smoked. She has never used smokeless tobacco. She reports that she does not drink alcohol or use illicit drugs.     Review of Systems  Constitutional: Negative for fever.  Eyes: Negative for blurred vision.  Respiratory: Negative for shortness of breath.   Gastrointestinal: Negative for nausea and vomiting.  Skin: Negative for rash.  Neurological: Negative for headaches.    Dilation: 10 Effacement (%): 100 Station: +1 Exam by:: V. Mensah Blood pressure 128/76, pulse 82, temperature 97.5 F (36.4 C), temperature source Oral, resp. rate 18, height 5' 2.75" (1.594 m), weight 212 lb (96.163 kg), last menstrual period 11/08/2012, SpO2 99.00%, currently breastfeeding. Maternal Exam:  Uterine Assessment: Contraction frequency is regular.   Abdomen: Patient reports no abdominal tenderness. Fetal presentation: vertex  Introitus:  not evaluated.     Fetal Exam Fetal Monitor Review: Variability: moderate (6-25 bpm).   Pattern: accelerations present and no decelerations.    Fetal State Assessment: Category I - tracings are normal.     Physical Exam  Constitutional: She appears well-developed.  HENT:  Head: Normocephalic.  Neck: Neck supple. No thyromegaly present.  Cardiovascular: Normal rate and regular rhythm.   Respiratory: Breath sounds normal.  GI: Soft. Bowel sounds are normal.  Skin: No rash noted.    Prenatal labs: ABO, Rh: --/--/A NEG (09/03 0857) Antibody: NEG (09/03 0857) Rubella: Immune (01/23 0000) RPR: NON REACTIVE (09/03 0315)  HBsAg: Negative (01/23 0000)  HIV: NON REACTIVE (05/05 0951)  GBS: POSITIVE (07/28 1529)   Assessment/Plan: IUP @ term.  Early labor.  ?h/o von willebrand's disease; no recent testing of factors/levels; GBS positive Category I FHT  -->Check von willebrand's panel now -->notify Anesthesiology of admission -->avoid invasive treatments/interventions -->monitor progress  -->GBS prophylaxis  [name],Courtney Bollman A 08/28/2013, 1:24 PM

## 2013-08-30 ENCOUNTER — Ambulatory Visit: Payer: 59 | Admitting: Obstetrics

## 2013-09-06 ENCOUNTER — Ambulatory Visit (INDEPENDENT_AMBULATORY_CARE_PROVIDER_SITE_OTHER): Payer: 59 | Admitting: Obstetrics

## 2013-09-06 ENCOUNTER — Encounter: Payer: Self-pay | Admitting: Obstetrics

## 2013-09-06 DIAGNOSIS — F3289 Other specified depressive episodes: Secondary | ICD-10-CM | POA: Insufficient documentation

## 2013-09-06 DIAGNOSIS — F329 Major depressive disorder, single episode, unspecified: Secondary | ICD-10-CM

## 2013-09-06 DIAGNOSIS — O99344 Other mental disorders complicating childbirth: Secondary | ICD-10-CM

## 2013-09-06 MED ORDER — SERTRALINE HCL 50 MG PO TABS: 50.0000 mg | ORAL_TABLET | Freq: Every day | ORAL | Status: DC

## 2013-09-06 NOTE — Progress Notes (Signed)
.   Subjective:     Courtney [name] is a 32 y.o. female who presents for a postpartum visit. She is 3 weeks postpartum following a low cervical transverse Cesarean section. I have fully reviewed the prenatal and intrapartum course. The delivery was at 40.2 gestational weeks. Outcome: primary cesarean section, low transverse incision. Anesthesia: epidural. Postpartum course has been normal. Baby's course has been normal. Baby is feeding by both breast and bottle Rush Barer. Bleeding red. Bowel function is normal. Bladder function is normal. Patient is not sexually active. Contraception method is oral progesterone-only contraceptive. Postpartum depression screening: positive.  The following portions of the patient's history were reviewed and updated as appropriate: allergies, current medications, past family history, past medical history, past social history, past surgical history and problem list.  Review of Systems Pertinent items are noted in HPI.   Objective:    BP 144/87  Pulse 99  Temp(Src) 98.3 F (36.8 C) (Oral)  Ht 5\' 2"  (1.575 m)  Wt 184 lb 9.6 oz (83.734 kg)  BMI 33.76 kg/m2  Breastfeeding? Yes  General:  alert and no distress   Breasts:  inspection negative, no nipple discharge or bleeding, no masses or nodularity palpable Abdomen:  Soft, NT.  Incision C, D, I.   Assessment:     Normal postpartum exam. Pap smear not done at today's visit.    Postpartum Depression. Plan:    1. Contraception: abstinence 2. Prozac Rx. 3. Follow up in: 3 months or as needed.

## 2013-09-20 ENCOUNTER — Ambulatory Visit (INDEPENDENT_AMBULATORY_CARE_PROVIDER_SITE_OTHER): Payer: 59 | Admitting: Obstetrics

## 2013-09-20 ENCOUNTER — Encounter: Payer: Self-pay | Admitting: Obstetrics

## 2013-09-20 DIAGNOSIS — R52 Pain, unspecified: Secondary | ICD-10-CM

## 2013-09-20 MED ORDER — IBUPROFEN 800 MG PO TABS: 800.0000 mg | ORAL_TABLET | Freq: Three times a day (TID) | ORAL | Status: DC | PRN

## 2013-09-20 NOTE — Progress Notes (Signed)
.   Subjective:     Courtney [name] is a 32 y.o. female who presents for a postpartum visit. She is 4 weeks postpartum following a low cervical transverse Cesarean section. I have fully reviewed the prenatal and intrapartum course. The delivery was at 40.2 gestational weeks. Outcome: primary cesarean section, low transverse incision. Anesthesia: epidural. Postpartum course has been normal. Baby's course has been normal. Baby is feeding by both breast and bottle Rush Barer. Bleeding red. Bowel function is normal. Bladder function is normal. Patient is not sexually active. Contraception method is oral progesterone-only contraceptive. Postpartum depression screening: positive.  The following portions of the patient's history were reviewed and updated as appropriate: allergies, current medications, past family history, past medical history, past social history, past surgical history and problem list.  Review of Systems Pertinent items are noted in HPI.   Objective:    BP 127/83  Pulse 83  Temp(Src) 98.3 F (36.8 C) (Oral)  Ht 5\' 2"  (1.575 m)  Wt 182 lb 12.8 oz (82.918 kg)  BMI 33.43 kg/m2  Breastfeeding? Yes  PE:  Breasts:  Soft, nontender   Abdomen:  Soft, NT.  Incision C, D, I.                                    Assessment:     Normal postpartum exam. Pap smear not done at today's visit.    Increased incisional pain after Cesarean Section.  Plan:    1. Contraception: abstinence 2. Contraceptive options discussed. 3. Recommend 8 weeks of postpartum recuperation before returning to work. 3. Follow up in: 6 weeks or as needed.

## 2013-09-26 ENCOUNTER — Encounter: Payer: Self-pay | Admitting: Obstetrics

## 2013-09-26 ENCOUNTER — Encounter: Payer: Self-pay | Admitting: *Deleted

## 2013-10-04 ENCOUNTER — Ambulatory Visit: Payer: 59 | Admitting: Obstetrics

## 2013-10-20 ENCOUNTER — Other Ambulatory Visit: Payer: Self-pay

## 2013-10-28 ENCOUNTER — Encounter: Payer: Self-pay | Admitting: Obstetrics

## 2013-11-01 ENCOUNTER — Encounter: Payer: Self-pay | Admitting: Obstetrics

## 2013-11-01 ENCOUNTER — Ambulatory Visit (INDEPENDENT_AMBULATORY_CARE_PROVIDER_SITE_OTHER): Payer: 59 | Admitting: Obstetrics

## 2013-11-01 NOTE — Progress Notes (Signed)
Subjective:     Courtney [name] is a 32 y.o. female who presents for a postpartum visit. She is 11 weeks postpartum following a low cervical transverse Cesarean section. I have fully reviewed the prenatal and intrapartum course. The delivery was at 40.2 gestational weeks. Outcome: primary cesarean section, low transverse incision. Anesthesia: general. Postpartum course has been WNL. Baby's course has been *WNL*. Baby is feeding by both breast and bottle - Gerber goodstart gentle. Bleeding no bleeding. Bowel function is normal. Bladder function is normal. Patient is not sexually active. Contraception method is abstinence. Postpartum depression screening: negative.  The following portions of the patient's history were reviewed and updated as appropriate: allergies, current medications, past family history, past medical history, past social history, past surgical history and problem list.  Review of Systems Pertinent items are noted in HPI.   Objective:    BP 136/84  Pulse 74  Temp(Src) 96.9 F (36.1 C)  Wt 185 lb (83.915 kg)  Breastfeeding? Yes  General:  alert and no distress   Breasts:  inspection negative, no nipple discharge or bleeding, no masses or nodularity palpable Abdomen:  Soft, NT.  Incision C, D, I. Pelvic:  Uterus NSSC, NT.  Adnexa negative.   Assessment:     Normal postpartum exam. Pap smear not done at today's visit.   Plan:    1. Contraception: oral progesterone-only contraceptive 2. Doing well. 3. Follow up in: several months or as needed.   Annual exam next visit.

## 2014-01-02 ENCOUNTER — Ambulatory Visit: Payer: 59 | Admitting: Obstetrics

## 2014-01-03 ENCOUNTER — Ambulatory Visit (INDEPENDENT_AMBULATORY_CARE_PROVIDER_SITE_OTHER): Payer: 59 | Admitting: Obstetrics

## 2014-01-03 ENCOUNTER — Encounter: Payer: Self-pay | Admitting: Obstetrics

## 2014-01-03 VITALS — BP 143/90 | HR 98 | Temp 98.3°F | Ht 62.0 in | Wt 186.0 lb

## 2014-01-03 DIAGNOSIS — Z113 Encounter for screening for infections with a predominantly sexual mode of transmission: Secondary | ICD-10-CM

## 2014-01-03 DIAGNOSIS — IMO0001 Reserved for inherently not codable concepts without codable children: Secondary | ICD-10-CM

## 2014-01-03 DIAGNOSIS — Z01419 Encounter for gynecological examination (general) (routine) without abnormal findings: Secondary | ICD-10-CM

## 2014-01-03 LAB — POCT URINALYSIS DIPSTICK
Bilirubin, UA: NEGATIVE
Glucose, UA: NEGATIVE
Ketones, UA: NEGATIVE
Leukocytes, UA: NEGATIVE
Nitrite, UA: NEGATIVE
PH UA: 5
RBC UA: NEGATIVE
SPEC GRAV UA: 1.01
UROBILINOGEN UA: NEGATIVE

## 2014-01-03 NOTE — Progress Notes (Signed)
Subjective:     Courtney [name] is a 33 y.o. female here for a routine exam.  Current complaints: would like to lose weight.  Personal health questionnaire reviewed: yes.   Gynecologic History No LMP recorded. Contraception: oral progesterone-only contraceptive Last Pap: 2013. Results were: normal Last mammogram: N/A  Obstetric History OB History  Gravida Para Term Preterm AB SAB TAB Ectopic Multiple Living  1 1 1       1     # Outcome Date GA Lbr Len/2nd Weight Sex Delivery Anes PTL Lv  1 TRM 08/17/13 [redacted]w[redacted]d 21:31 / 04:17 8 lb 4.6 oz (3.759 kg) M LTCS Gen  Y       The following portions of the patient's history were reviewed and updated as appropriate: allergies, current medications, past family history, past medical history, past social history, past surgical history and problem list.  Review of Systems Pertinent items are noted in HPI.    Objective:    Pelvic: cervix normal in appearance, external genitalia normal, no adnexal masses or tenderness, no cervical motion tenderness, rectovaginal septum normal, uterus normal size, shape, and consistency and vagina normal without discharge    Assessment:    Healthy female exam.     Plan:    Education reviewed: low fat, low cholesterol diet, safe sex/STD prevention and self breast exams. Contraception: oral progesterone-only contraceptive. Follow up in: 1 year.

## 2014-01-04 LAB — PAP IG W/ RFLX HPV ASCU

## 2014-01-04 LAB — WET PREP BY MOLECULAR PROBE
CANDIDA SPECIES: NEGATIVE
GARDNERELLA VAGINALIS: POSITIVE — AB
TRICHOMONAS VAG: NEGATIVE

## 2014-01-06 ENCOUNTER — Encounter: Payer: Self-pay | Admitting: Obstetrics

## 2014-01-06 ENCOUNTER — Other Ambulatory Visit: Payer: Self-pay | Admitting: *Deleted

## 2014-01-06 DIAGNOSIS — T3695XA Adverse effect of unspecified systemic antibiotic, initial encounter: Secondary | ICD-10-CM

## 2014-01-06 DIAGNOSIS — B9689 Other specified bacterial agents as the cause of diseases classified elsewhere: Secondary | ICD-10-CM

## 2014-01-06 DIAGNOSIS — N760 Acute vaginitis: Principal | ICD-10-CM

## 2014-01-06 DIAGNOSIS — B379 Candidiasis, unspecified: Secondary | ICD-10-CM

## 2014-01-06 MED ORDER — FLUCONAZOLE 150 MG PO TABS: 150.0000 mg | ORAL_TABLET | Freq: Once | ORAL | Status: DC

## 2014-01-06 MED ORDER — CLINDAMYCIN HCL 300 MG PO CAPS: 300.0000 mg | ORAL_CAPSULE | Freq: Three times a day (TID) | ORAL | Status: DC

## 2014-02-21 ENCOUNTER — Other Ambulatory Visit: Payer: Self-pay | Admitting: *Deleted

## 2014-02-21 DIAGNOSIS — IMO0001 Reserved for inherently not codable concepts without codable children: Secondary | ICD-10-CM

## 2014-02-21 MED ORDER — LEVONORGESTREL-ETHINYL ESTRAD 0.15-30 MG-MCG PO TABS: 1.0000 | ORAL_TABLET | Freq: Every day | ORAL | Status: DC

## 2014-02-22 ENCOUNTER — Encounter: Payer: Self-pay | Admitting: Obstetrics

## 2014-08-02 IMAGING — US US OB COMP LESS 14 WK
1 series · 13 of 28 positions shown · non-contrast
Comparison: None.

CLINICAL DATA: Vaginal bleeding.

OBSTETRIC <14 WK US AND TRANSVAGINAL OB US
TECHNIQUE: Both transabdominal and transvaginal ultrasound
examinations were performed for complete evaluation of the
gestation as well as the maternal uterus, adnexal regions, and
pelvic cul-de-sac.  Transvaginal technique was performed to assess
early pregnancy.

[Series 1: us ob comp less 14 wks · 13 of 59 slices shown]
[im 3/59]
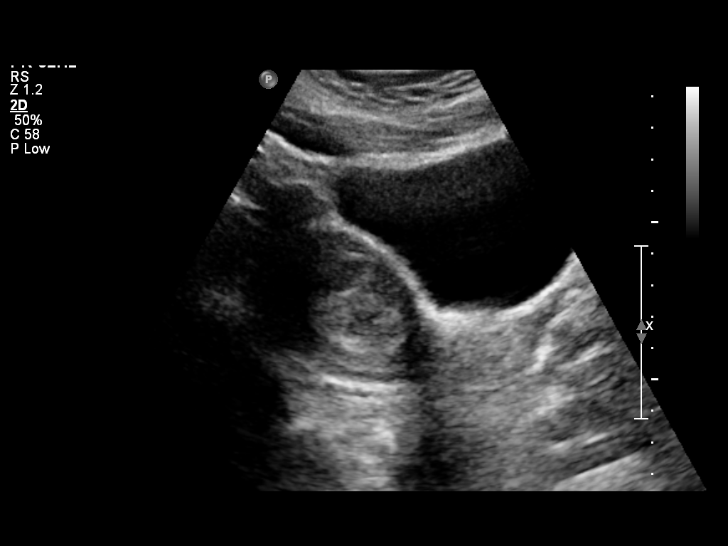
[im 7/59]
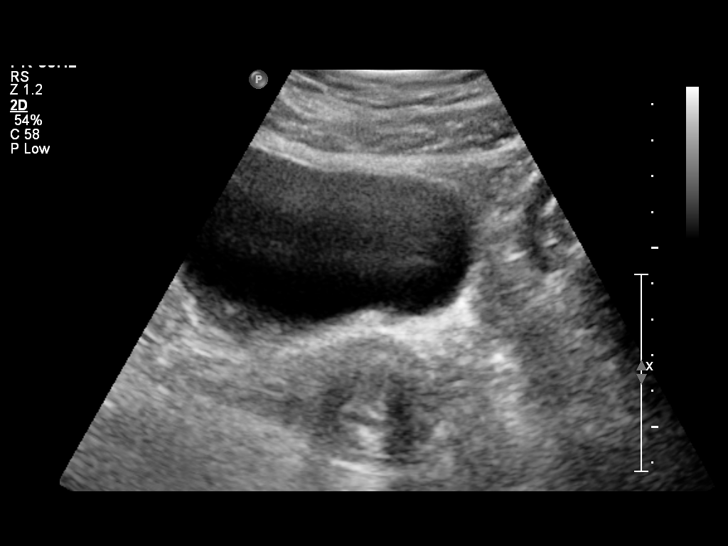
[im 11/59]
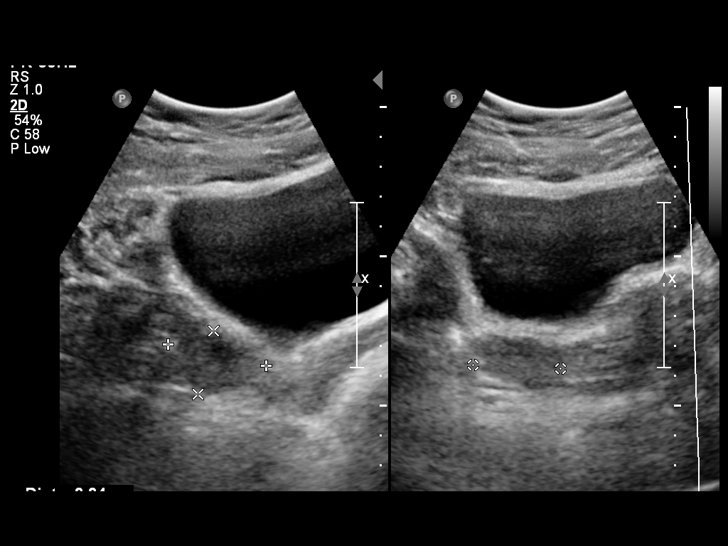
[im 16/59]
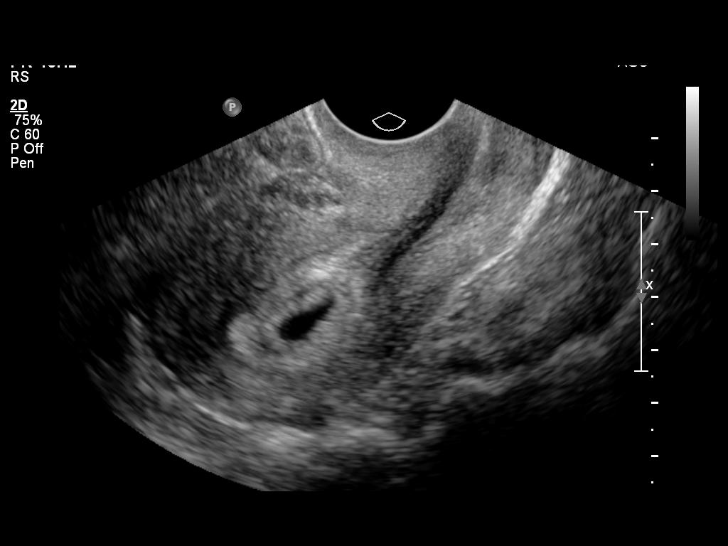
[im 20/59]
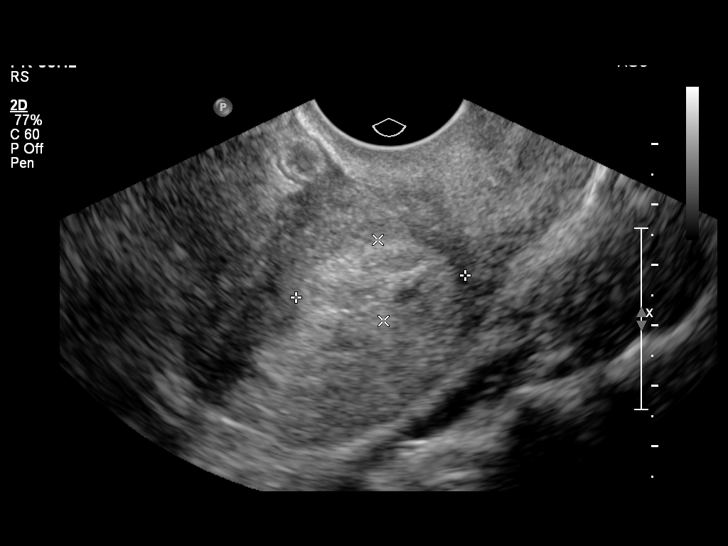
[im 24/59]
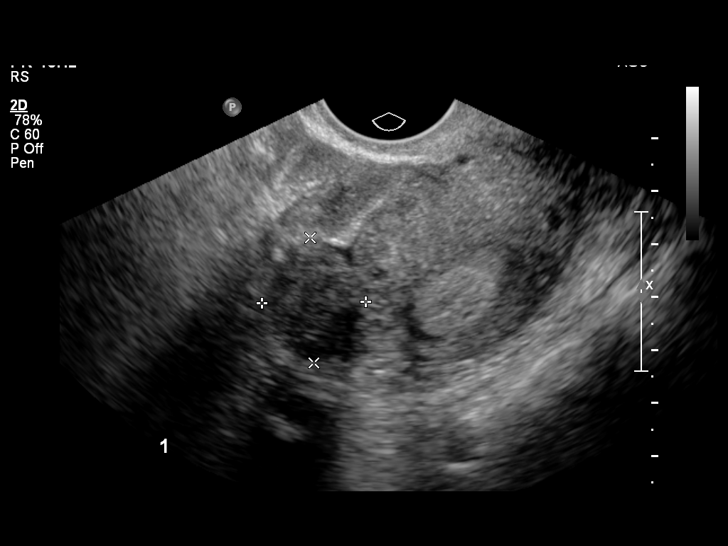
[im 31/59]
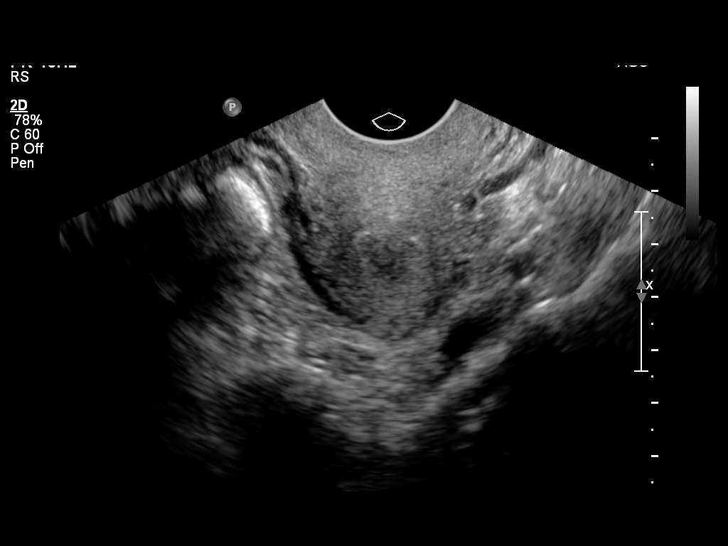
[im 35/59]
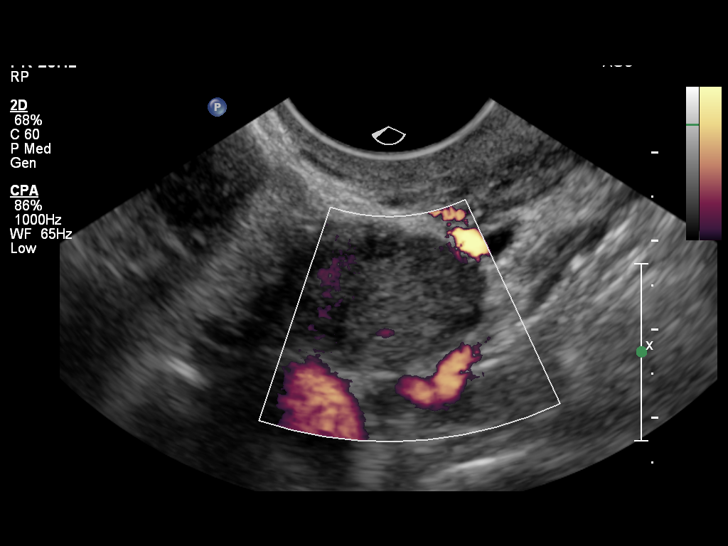
[im 39/59]
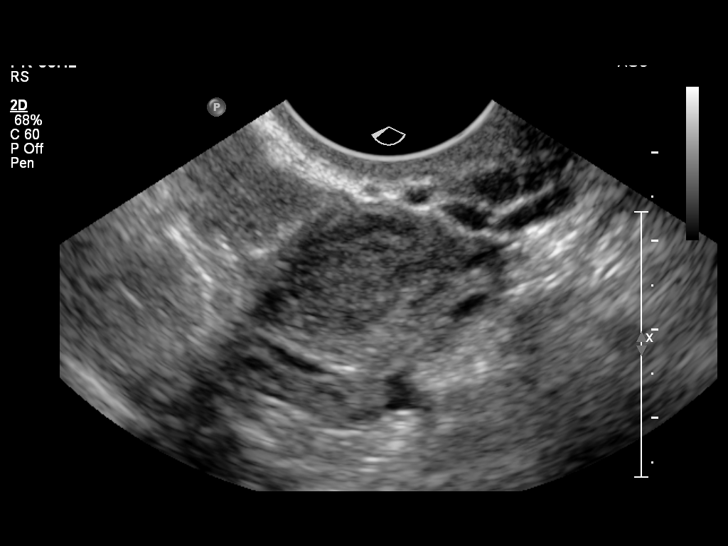
[im 43/59]
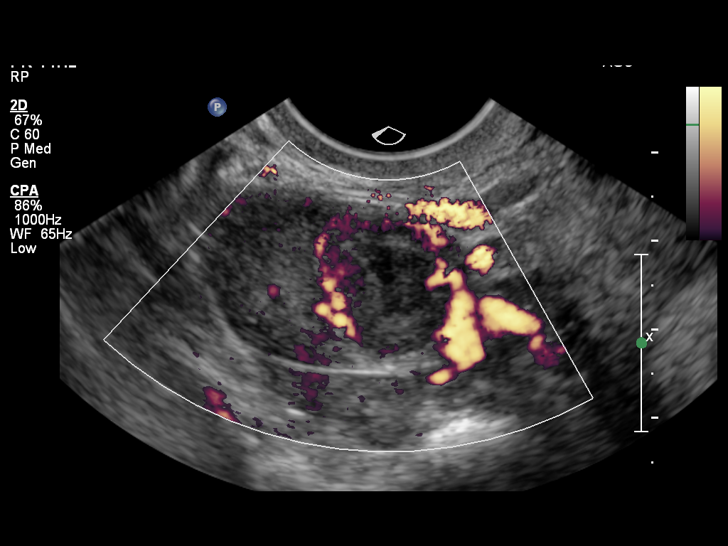
[im 48/59]
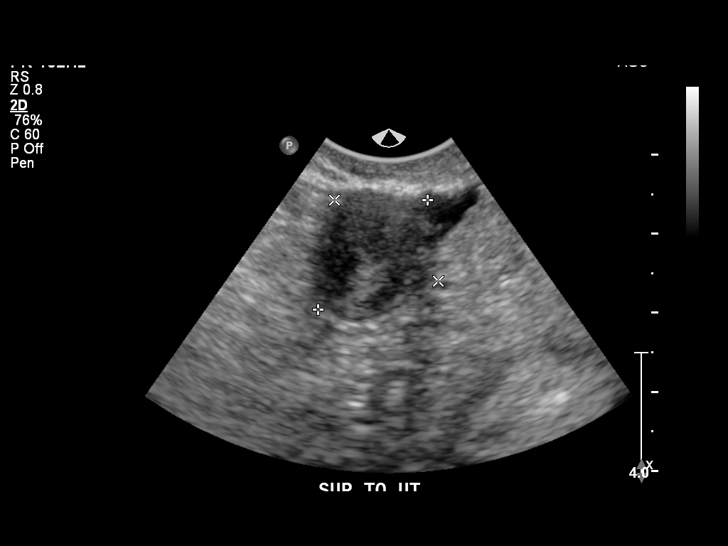
[im 52/59]
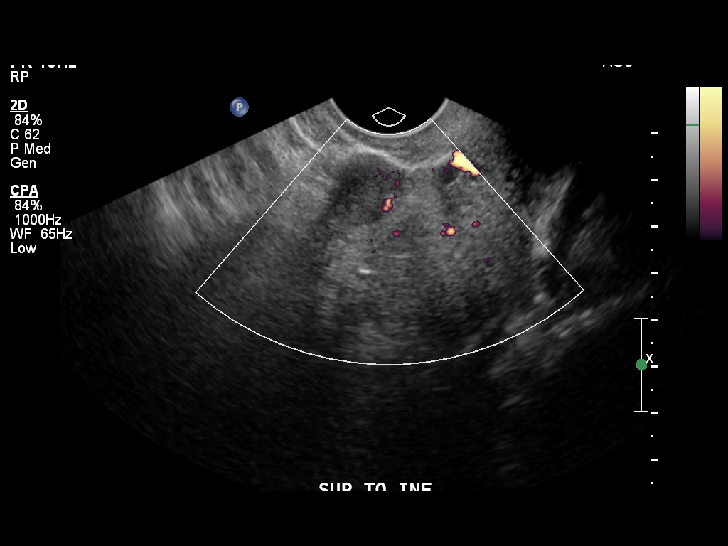
[im 56/59]
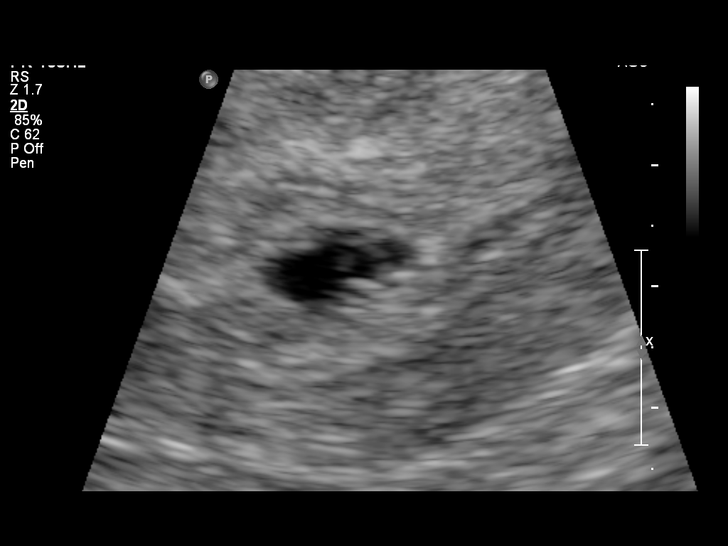

[13 of 28 positions shown; findings below may reference images not displayed]

Intrauterine gestational sac:  Visualized/normal in shape.
Yolk sac: Yes
Embryo: No
Cardiac Activity: N/A

MSD: 9.5 mm  5 w 5 d

Maternal uterus/adnexae:
A moderate amount of chronic-appearing subchorionic hemorrhage is
seen.  Multiple small fibroids are noted within the uterus,
measuring up to 2.4 cm in size.  One of these appears to be
intramural, while a second is subserosal in nature, and a third is
likely pedunculated, arising from the superior aspect of the
uterus.

The ovaries are unremarkable in appearance.  The right ovary
measures 3.5 x 2.0 x 2.9 cm, while the left ovary measures 2.8 x
1.8 x 1.6 cm.  No suspicious adnexal masses are seen; there is no
evidence for ovarian torsion.

No free fluid is seen within the pelvic cul-de-sac.
IMPRESSION: 1.  Single intrauterine gestational sac noted, with a mean sac
diameter of 1.0 cm, corresponding to a gestational age of 5 weeks 5
days.  This matches the gestational age of 5 weeks 3 days by LMP,
reflecting an estimated date of delivery August 15, 2013.
2.  Moderate amount of chronic-appearing subchorionic hemorrhage
noted.
3.  Three small uterine fibroids seen, measuring up to 2.4 cm in
size.  These appear intramural, subserosal and pedunculated in
nature.

## 2014-08-20 ENCOUNTER — Other Ambulatory Visit: Payer: Self-pay | Admitting: Obstetrics & Gynecology

## 2014-10-16 ENCOUNTER — Encounter: Payer: Self-pay | Admitting: Obstetrics

## 2014-11-01 ENCOUNTER — Ambulatory Visit: Payer: 59 | Admitting: Obstetrics

## 2014-11-02 ENCOUNTER — Telehealth: Payer: Self-pay | Admitting: *Deleted

## 2014-11-02 NOTE — Telephone Encounter (Signed)
Patient states she was on blood pressure medication. Patient states prior to being pregnant she was on the medication and then sopped once she became pregnant. Patient states her blood pressure is running around 130/89. Patient is requestng a prescription for blood pressure medication and a referral to a primary care provider.

## 2014-11-02 NOTE — Telephone Encounter (Signed)
OK for refill.

## 2014-11-07 ENCOUNTER — Other Ambulatory Visit: Payer: Self-pay | Admitting: *Deleted

## 2014-11-07 DIAGNOSIS — IMO0001 Reserved for inherently not codable concepts without codable children: Secondary | ICD-10-CM

## 2014-11-07 DIAGNOSIS — R030 Elevated blood-pressure reading, without diagnosis of hypertension: Principal | ICD-10-CM

## 2014-11-07 NOTE — Telephone Encounter (Signed)
Call to patient- LM on VM- need name of last BP medication she was using. Also will do a referral to primary care.

## 2014-12-11 ENCOUNTER — Encounter: Payer: Self-pay | Admitting: *Deleted

## 2014-12-12 ENCOUNTER — Encounter: Payer: Self-pay | Admitting: Obstetrics & Gynecology

## 2014-12-22 ENCOUNTER — Other Ambulatory Visit: Payer: Self-pay | Admitting: Obstetrics

## 2015-01-31 ENCOUNTER — Ambulatory Visit (INDEPENDENT_AMBULATORY_CARE_PROVIDER_SITE_OTHER): Payer: 59 | Admitting: Obstetrics

## 2015-01-31 ENCOUNTER — Encounter: Payer: Self-pay | Admitting: Obstetrics

## 2015-01-31 VITALS — BP 166/84 | HR 102 | Temp 98.8°F | Ht 63.0 in | Wt 168.0 lb

## 2015-01-31 DIAGNOSIS — I10 Essential (primary) hypertension: Secondary | ICD-10-CM

## 2015-01-31 DIAGNOSIS — Z3169 Encounter for other general counseling and advice on procreation: Secondary | ICD-10-CM

## 2015-01-31 DIAGNOSIS — Z01419 Encounter for gynecological examination (general) (routine) without abnormal findings: Secondary | ICD-10-CM

## 2015-01-31 DIAGNOSIS — Z124 Encounter for screening for malignant neoplasm of cervix: Secondary | ICD-10-CM

## 2015-01-31 MED ORDER — TRIAMTERENE-HCTZ 37.5-25 MG PO CAPS: 1.0000 | ORAL_CAPSULE | Freq: Every day | ORAL | Status: DC

## 2015-01-31 NOTE — Progress Notes (Signed)
Subjective:        Courtney [name] is a 34 y.o. female here for a routine exam.  Current complaints: none.    Personal health questionnaire:  Is patient Ashkenazi Jewish, have a family history of breast and/or ovarian cancer: no Is there a family history of uterine cancer diagnosed at age < 69, gastrointestinal cancer, urinary tract cancer, family member who is a Field seismologist syndrome-associated carrier: no Is the patient overweight and hypertensive, family history of diabetes, personal history of gestational diabetes, preeclampsia or PCOS: no Is patient over 46, have PCOS,  family history of premature CHD under age 73, diabetes, smoke, have hypertension or peripheral artery disease:  no At any time, has a partner hit, kicked or otherwise hurt or frightened you?: no Over the past 2 weeks, have you felt down, depressed or hopeless?: no Over the past 2 weeks, have you felt little interest or pleasure in doing things?:no   Gynecologic History Patient's last menstrual period was 01/17/2015. Contraception: OCP (estrogen/progesterone) Last Pap: 2015. Results were: normal Last mammogram: n/a. Results were: n/a  Obstetric History OB History  Gravida Para Term Preterm AB SAB TAB Ectopic Multiple Living  1 1 1       1     # Outcome Date GA Lbr Len/2nd Weight Sex Delivery Anes PTL Lv  1 Term 08/17/13 [redacted]w[redacted]d 21:31 / 04:17 8 lb 4.6 oz (3.759 kg) M CS-LTranv Gen  Y      Past Medical History  Diagnosis Date  . Hypertension   . Von Willebrand disease     Past Surgical History  Procedure Laterality Date  . Hemmeroid    . Gynecologic cryosurgery    . Cesarean section N/A 08/17/2013    Procedure: Primary Cesarean Section Delivery Baby Boy @ 2426, Apgars 9/10 ;  Surgeon: Lahoma Crocker, MD;  Location: [facility];  Service: Obstetrics;  Laterality: N/A;     Current outpatient prescriptions:  .  ALTAVERA 0.15-30 MG-MCG tablet, TAKE 1 TABLET BY MOUTH DAILY., Disp: 28 tablet, Rfl: 6 .   Prenatal Vit-Fe Fumarate-FA (MULTIVITAMIN-PRENATAL) 27-0.8 MG TABS tablet, Take 1 tablet by mouth daily at 12 noon., Disp: , Rfl:  .  clindamycin (CLEOCIN) 300 MG capsule, Take 1 capsule (300 mg total) by mouth every 8 (eight) hours. For 10 days (Patient not taking: Reported on 01/31/2015), Disp: 30 capsule, Rfl: 0 .  fluconazole (DIFLUCAN) 150 MG tablet, Take 1 tablet (150 mg total) by mouth once. (Patient not taking: Reported on 01/31/2015), Disp: 1 tablet, Rfl: 0 .  triamterene-hydrochlorothiazide (DYAZIDE) 37.5-25 MG per capsule, Take 1 each (1 capsule total) by mouth daily., Disp: 30 capsule, Rfl: 11 No Known Allergies  History  Substance Use Topics  . Smoking status: Never Smoker   . Smokeless tobacco: Never Used  . Alcohol Use: Yes     Comment: wine rarely    Family History  Problem Relation Age of Onset  . Diabetes Maternal Grandmother   . Hypertension Maternal Grandmother   . Cancer Maternal Grandfather   . Emphysema Maternal Grandfather   . Diabetes Paternal Grandmother   . Hypertension Paternal Grandmother   . Cancer Paternal Grandfather       Review of Systems  Constitutional: negative for fatigue and weight loss Respiratory: negative for cough and wheezing Cardiovascular: negative for chest pain, fatigue and palpitations Gastrointestinal: negative for abdominal pain and change in bowel habits Musculoskeletal:negative for myalgias Neurological: negative for gait problems and tremors Behavioral/Psych: negative for abusive relationship, depression Endocrine: negative  for temperature intolerance   Genitourinary:negative for abnormal menstrual periods, genital lesions, hot flashes, sexual problems and vaginal discharge Integument/breast: negative for breast lump, breast tenderness, nipple discharge and skin lesion(s)    Objective:       BP 166/84 mmHg  Pulse 102  Temp(Src) 98.8 F (37.1 C)  Ht 5\' 3"  (1.6 m)  Wt 168 lb (76.204 kg)  BMI 29.77 kg/m2  LMP  01/17/2015 General:   alert  Skin:   no rash or abnormalities  Lungs:   clear to auscultation bilaterally  Heart:   regular rate and rhythm, S1, S2 normal, no murmur, click, rub or gallop  Breasts:   normal without suspicious masses, skin or nipple changes or axillary nodes  Abdomen:  normal findings: no organomegaly, soft, non-tender and no hernia  Pelvis:  External genitalia: normal general appearance Urinary system: urethral meatus normal and bladder without fullness, nontender Vaginal: normal without tenderness, induration or masses Cervix: normal appearance Adnexa: normal bimanual exam Uterus: anteverted and non-tender, normal size   Lab Review Urine pregnancy test Labs reviewed yes Radiologic studies reviewed no   Assessment:    Healthy female exam.    Hypertension, mild  Preconception counseling.  Wants another child in a year.    Plan:    Education reviewed: calcium supplements, low fat, low cholesterol diet and weight bearing exercise. Contraception: OCP (estrogen/progesterone). Follow up in: 1 year. Referred to IM for routine health maintenance  Dyazide Rx  Meds ordered this encounter  Medications  . triamterene-hydrochlorothiazide (DYAZIDE) 37.5-25 MG per capsule    Sig: Take 1 each (1 capsule total) by mouth daily.    Dispense:  30 capsule    Refill:  11   Orders Placed This Encounter  Procedures  . SureSwab, Vaginosis/Vaginitis Plus  . Ambulatory referral to Internal Medicine    Referral Priority:  Routine    Referral Type:  Consultation    Referral Reason:  Specialty Services Required    Requested Specialty:  Internal Medicine    Number of Visits Requested:  1

## 2015-02-01 ENCOUNTER — Telehealth: Payer: Self-pay

## 2015-02-01 NOTE — Telephone Encounter (Signed)
patient has appt with [facility] with PCP ion 03/15/15 at 10am - left message with patient, can call directly if need to change appt.

## 2015-02-02 ENCOUNTER — Other Ambulatory Visit: Payer: Self-pay | Admitting: Obstetrics

## 2015-02-02 DIAGNOSIS — T3695XA Adverse effect of unspecified systemic antibiotic, initial encounter: Principal | ICD-10-CM

## 2015-02-02 DIAGNOSIS — B379 Candidiasis, unspecified: Secondary | ICD-10-CM

## 2015-02-02 LAB — PAP IG AND HPV HIGH-RISK: HPV DNA High Risk: NOT DETECTED

## 2015-02-02 MED ORDER — FLUCONAZOLE 150 MG PO TABS: 150.0000 mg | ORAL_TABLET | Freq: Once | ORAL | Status: DC

## 2015-02-05 ENCOUNTER — Other Ambulatory Visit: Payer: Self-pay | Admitting: Obstetrics

## 2015-02-05 LAB — SURESWAB, VAGINOSIS/VAGINITIS PLUS
Atopobium vaginae: NOT DETECTED Log (cells/mL)
C. ALBICANS, DNA: DETECTED — AB
C. PARAPSILOSIS, DNA: NOT DETECTED
C. TROPICALIS, DNA: NOT DETECTED
C. glabrata, DNA: NOT DETECTED
C. trachomatis RNA, TMA: NOT DETECTED
GARDNERELLA VAGINALIS: NOT DETECTED Log (cells/mL)
LACTOBACILLUS SPECIES: 7.7 Log (cells/mL)
MEGASPHAERA SPECIES: NOT DETECTED Log (cells/mL)
N. gonorrhoeae RNA, TMA: NOT DETECTED
T. vaginalis RNA, QL TMA: NOT DETECTED

## 2015-03-15 ENCOUNTER — Ambulatory Visit: Payer: Self-pay | Admitting: Family

## 2015-03-20 ENCOUNTER — Ambulatory Visit: Payer: Self-pay | Admitting: Family

## 2015-03-21 ENCOUNTER — Encounter: Payer: Self-pay | Admitting: Family

## 2015-03-21 ENCOUNTER — Ambulatory Visit (INDEPENDENT_AMBULATORY_CARE_PROVIDER_SITE_OTHER): Payer: 59 | Admitting: Family

## 2015-03-21 VITALS — BP 148/90 | HR 108 | Temp 98.4°F | Resp 18 | Ht 63.0 in | Wt 172.8 lb

## 2015-03-21 DIAGNOSIS — I10 Essential (primary) hypertension: Secondary | ICD-10-CM | POA: Insufficient documentation

## 2015-03-21 MED ORDER — METHYLDOPA 250 MG PO TABS: 250.0000 mg | ORAL_TABLET | Freq: Two times a day (BID) | ORAL | Status: DC

## 2015-03-21 NOTE — Progress Notes (Signed)
Pre visit review using our clinic review tool, if applicable. No additional management support is needed unless otherwise documented below in the visit note. 

## 2015-03-21 NOTE — Progress Notes (Signed)
Subjective:    Patient ID: Courtney [name], female    DOB: [birth year], 34 y.o.   MRN: [id number]  Chief Complaint  Patient presents with  . Establish Care    HPI:  Courtney [name] is a 34 y.o. female who presents today to establish care an discuss her hypertension.   1) Blood pressure - has previously been diangosed with hypertension by her OB/GYN. She was started on triamterne-HCTZ.  Notes that she is trying to conceive a second child and has some concern about her blood pressure.   BP Readings from Last 3 Encounters:  03/21/15 148/90  01/31/15 166/84  01/03/14 143/90    No Known Allergies   Current Outpatient Prescriptions on File Prior to Visit  Medication Sig Dispense Refill  . ALTAVERA 0.15-30 MG-MCG tablet TAKE 1 TABLET BY MOUTH DAILY. 28 tablet 6  . Prenatal Vit-Fe Fumarate-FA (MULTIVITAMIN-PRENATAL) 27-0.8 MG TABS tablet Take 1 tablet by mouth daily at 12 noon.    . triamterene-hydrochlorothiazide (DYAZIDE) 37.5-25 MG per capsule Take 1 each (1 capsule total) by mouth daily. 30 capsule 11   No current facility-administered medications on file prior to visit.    Past Medical History  Diagnosis Date  . Hypertension   . Von Willebrand disease   . Allergy   . UTI (lower urinary tract infection)     Past Surgical History  Procedure Laterality Date  . Hemmeroid    . Gynecologic cryosurgery    . Cesarean section N/A 08/17/2013    Procedure: Primary Cesarean Section Delivery Baby Boy @ 7116, Apgars 9/10 ;  Surgeon: Lahoma Crocker, MD;  Location: [facility];  Service: Obstetrics;  Laterality: N/A;    Family History  Problem Relation Age of Onset  . Diabetes Maternal Grandmother   . Hypertension Maternal Grandmother   . Cancer Maternal Grandfather   . Emphysema Maternal Grandfather   . Diabetes Paternal Grandmother   . Hypertension Paternal Grandmother   . Cancer Paternal Grandfather     History   Social History  . Marital Status: Married   Spouse Name: N/A  . Number of Children: 1  . Years of Education: 16   Occupational History  . Customer service rep    Social History Main Topics  . Smoking status: Never Smoker   . Smokeless tobacco: Never Used  . Alcohol Use: Yes     Comment: wine rarely  . Drug Use: No  . Sexual Activity: Yes    Birth Control/ Protection: OCP, Pill   Other Topics Concern  . Not on file   Social History Narrative   Fun: play with her child   Denies religious beliefs that would effect health care.     Review of Systems  Respiratory: Negative for chest tightness.   Cardiovascular: Negative for chest pain, palpitations and leg swelling.      Objective:    BP 148/90 mmHg  Pulse 108  Temp(Src) 98.4 F (36.9 C) (Oral)  Resp 18  Ht 5\' 3"  (1.6 m)  Wt 172 lb 12.8 oz (78.382 kg)  BMI 30.62 kg/m2  SpO2 99% Nursing note and vital signs reviewed.  Physical Exam  Constitutional: She is oriented to person, place, and time. She appears well-developed and well-nourished. No distress.  Cardiovascular: Normal rate, regular rhythm, normal heart sounds and intact distal pulses.   Pulmonary/Chest: Effort normal and breath sounds normal.  Neurological: She is alert and oriented to person, place, and time.  Skin: Skin is warm and dry.  Psychiatric: She  has a normal mood and affect. Her behavior is normal. Judgment and thought content normal.       Assessment & Plan:

## 2015-03-21 NOTE — Assessment & Plan Note (Signed)
Blood pressure remains slightly elevated today above goal 140/90. Continue current dosage of triamterene-hydrochlorothiazide. Start methyldopa. Follow-up in one month.

## 2015-03-21 NOTE — Patient Instructions (Signed)
Thank you for choosing [facility].  Summary/Instructions:  Your prescription(s) have been submitted to your pharmacy or been printed and provided for you. Please take as directed and contact our office if you believe you are having problem(s) with the medication(s) or have any questions.  If your symptoms worsen or fail to improve, please contact our office for further instruction, or in case of emergency go directly to the emergency room at the closest medical facility.    Hypertension Hypertension, commonly called high blood pressure, is when the force of blood pumping through your arteries is too strong. Your arteries are the blood vessels that carry blood from your heart throughout your body. A blood pressure reading consists of a higher number over a lower number, such as 110/72. The higher number (systolic) is the pressure inside your arteries when your heart pumps. The lower number (diastolic) is the pressure inside your arteries when your heart relaxes. Ideally you want your blood pressure below 120/80. Hypertension forces your heart to work harder to pump blood. Your arteries may become narrow or stiff. Having hypertension puts you at risk for heart disease, stroke, and other problems.  RISK FACTORS Some risk factors for high blood pressure are controllable. Others are not.  Risk factors you cannot control include:   Race. You may be at higher risk if you are African American.  Age. Risk increases with age.  Gender. Men are at higher risk than women before age 19 years. After age 76, women are at higher risk than men. Risk factors you can control include:  Not getting enough exercise or physical activity.  Being overweight.  Getting too much fat, sugar, calories, or salt in your diet.  Drinking too much alcohol. SIGNS AND SYMPTOMS Hypertension does not usually cause signs or symptoms. Extremely high blood pressure (hypertensive crisis) may cause headache, anxiety,  shortness of breath, and nosebleed. DIAGNOSIS  To check if you have hypertension, your health care provider will measure your blood pressure while you are seated, with your arm held at the level of your heart. It should be measured at least twice using the same arm. Certain conditions can cause a difference in blood pressure between your right and left arms. A blood pressure reading that is higher than normal on one occasion does not mean that you need treatment. If one blood pressure reading is high, ask your health care provider about having it checked again. TREATMENT  Treating high blood pressure includes making lifestyle changes and possibly taking medicine. Living a healthy lifestyle can help lower high blood pressure. You may need to change some of your habits. Lifestyle changes may include:  Following the DASH diet. This diet is high in fruits, vegetables, and whole grains. It is low in salt, red meat, and added sugars.  Getting at least 2 hours of brisk physical activity every week.  Losing weight if necessary.  Not smoking.  Limiting alcoholic beverages.  Learning ways to reduce stress. If lifestyle changes are not enough to get your blood pressure under control, your health care provider may prescribe medicine. You may need to take more than one. Work closely with your health care provider to understand the risks and benefits. HOME CARE INSTRUCTIONS  Have your blood pressure rechecked as directed by your health care provider.   Take medicines only as directed by your health care provider. Follow the directions carefully. Blood pressure medicines must be taken as prescribed. The medicine does not work as well when you skip  doses. Skipping doses also puts you at risk for problems.   Do not smoke.   Monitor your blood pressure at home as directed by your health care provider. SEEK MEDICAL CARE IF:   You think you are having a reaction to medicines taken.  You have  recurrent headaches or feel dizzy.  You have swelling in your ankles.  You have trouble with your vision. SEEK IMMEDIATE MEDICAL CARE IF:  You develop a severe headache or confusion.  You have unusual weakness, numbness, or feel faint.  You have severe chest or abdominal pain.  You vomit repeatedly.  You have trouble breathing. MAKE SURE YOU:   Understand these instructions.  Will watch your condition.  Will get help right away if you are not doing well or get worse. Document Released: 12/01/2005 Document Revised: 04/17/2014 Document Reviewed: 09/23/2013 [facility] Patient Information 2015 [place], [place]. This information is not intended to replace advice given to you by your health care provider. Make sure you discuss any questions you have with your health care provider.

## 2015-04-23 ENCOUNTER — Ambulatory Visit (INDEPENDENT_AMBULATORY_CARE_PROVIDER_SITE_OTHER): Payer: 59 | Admitting: Family

## 2015-04-23 ENCOUNTER — Encounter: Payer: Self-pay | Admitting: Family

## 2015-04-23 VITALS — BP 140/92 | HR 106 | Temp 98.2°F | Resp 18 | Ht 63.0 in | Wt 171.3 lb

## 2015-04-23 DIAGNOSIS — I10 Essential (primary) hypertension: Secondary | ICD-10-CM

## 2015-04-23 MED ORDER — METHYLDOPA 500 MG PO TABS: 500.0000 mg | ORAL_TABLET | Freq: Two times a day (BID) | ORAL | Status: DC

## 2015-04-23 NOTE — Assessment & Plan Note (Signed)
Blood pressure remains above goal of 140/90, although improved from previous readings. Blood pressure at work is noted to be higher compared to at home. Increase methyldopa to 500 mg twice daily. Continue current dosage of Dyazide. Continue record blood pressures as discussed. Follow-up with average blood readings one week prior to and the month.

## 2015-04-23 NOTE — Patient Instructions (Signed)
Thank you for choosing [facility].  Summary/Instructions:  Please increase your Methyldopa to 500 mg twice daily.  Continue to take your blood pressure and record the results.   Your prescription(s) have been submitted to your pharmacy or been printed and provided for you. Please take as directed and contact our office if you believe you are having problem(s) with the medication(s) or have any questions.  If your symptoms worsen or fail to improve, please contact our office for further instruction, or in case of emergency go directly to the emergency room at the closest medical facility.

## 2015-04-23 NOTE — Progress Notes (Signed)
Pre visit review using our clinic review tool, if applicable. No additional management support is needed unless otherwise documented below in the visit note. 

## 2015-04-23 NOTE — Progress Notes (Signed)
   Subjective:    Patient ID: Courtney [name], female    DOB: 1981/02/25, 34 y.o.   MRN: [id number]  Chief Complaint  Patient presents with  . Follow-up    has been keeping up with her BP seems to be alot higher at work    HPI:  Courtney [name] is a 34 y.o. female with a PMH of hypertension, uterine fibroids, and depression who presents today for an office follow up.   1.) Hypertension - Previously started on methyldopa and maintained on Dyazide. Reports taking the medication as prescribed and denies any adverse side effects. Blood pressure log shows an average increase at work. Reading average about 140-150s/90-100s.  BP Readings from Last 3 Encounters:  04/23/15 140/92  03/21/15 148/90  01/31/15 166/84    No Known Allergies  Current Outpatient Prescriptions on File Prior to Visit  Medication Sig Dispense Refill  . methyldopa (ALDOMET) 250 MG tablet Take 1 tablet (250 mg total) by mouth 2 (two) times daily. 60 tablet 0  . Prenatal Vit-Fe Fumarate-FA (MULTIVITAMIN-PRENATAL) 27-0.8 MG TABS tablet Take 1 tablet by mouth daily at 12 noon.    . triamterene-hydrochlorothiazide (DYAZIDE) 37.5-25 MG per capsule Take 1 each (1 capsule total) by mouth daily. 30 capsule 11   No current facility-administered medications on file prior to visit.    Review of Systems  Eyes:       Negative for changes in vision.   Respiratory: Negative for chest tightness.   Cardiovascular: Negative for chest pain, palpitations and leg swelling.      Objective:    BP 140/92 mmHg  Pulse 106  Temp(Src) 98.2 F (36.8 C) (Oral)  Resp 18  Ht 5\' 3"  (1.6 m)  Wt 171 lb 4.8 oz (77.701 kg)  BMI 30.35 kg/m2  SpO2 94% Nursing note and vital signs reviewed.  Physical Exam  Constitutional: She is oriented to person, place, and time. She appears well-developed and well-nourished. No distress.  Cardiovascular: Normal rate, regular rhythm, normal heart sounds and intact distal pulses.     Pulmonary/Chest: Effort normal and breath sounds normal.  Neurological: She is alert and oriented to person, place, and time.  Skin: Skin is warm and dry.  Psychiatric: She has a normal mood and affect. Her behavior is normal. Judgment and thought content normal.       Assessment & Plan:

## 2015-04-25 ENCOUNTER — Other Ambulatory Visit: Payer: Self-pay | Admitting: Family

## 2015-05-21 ENCOUNTER — Other Ambulatory Visit: Payer: Self-pay | Admitting: Family

## 2015-05-23 ENCOUNTER — Encounter: Payer: Self-pay | Admitting: Family

## 2015-06-06 ENCOUNTER — Other Ambulatory Visit: Payer: Self-pay | Admitting: Obstetrics

## 2015-06-19 ENCOUNTER — Other Ambulatory Visit: Payer: Self-pay | Admitting: Family

## 2015-07-02 ENCOUNTER — Other Ambulatory Visit: Payer: Self-pay | Admitting: Obstetrics

## 2015-07-02 ENCOUNTER — Other Ambulatory Visit (INDEPENDENT_AMBULATORY_CARE_PROVIDER_SITE_OTHER): Payer: 59

## 2015-07-02 VITALS — BP 135/91 | HR 102 | Temp 99.5°F | Ht 63.0 in | Wt 171.0 lb

## 2015-07-02 DIAGNOSIS — Z3201 Encounter for pregnancy test, result positive: Secondary | ICD-10-CM | POA: Diagnosis not present

## 2015-07-02 DIAGNOSIS — N926 Irregular menstruation, unspecified: Secondary | ICD-10-CM

## 2015-07-02 LAB — POCT URINE PREGNANCY: Preg Test, Ur: POSITIVE — AB

## 2015-07-02 NOTE — Progress Notes (Unsigned)
Patient in office today for a confirmation of pregnancy. Patient states she had a positive home pregnancy test. Pregnancy Test in office is positive and it is a planned pregnancy. Patient encourage to continue her prenatal vitamins and to schedule her NOB appointment.  Patient voiced concern about some spotting she has had with her blood type as well as her blood pressure and the medication she is currently taking. Patient was able to consult with Dr. Jodi Mourning regarding the spotting and blood pressure medication. Patient was instructed per Dr. Jodi Mourning to discontinue her current blood pressure medication and to start Labetalol which was e scribed. Patient instructed per Dr. Jodi Mourning that spotting was okay and common and if she has any heavy bleeding she would need to have a rhogam injection. Patient verbalized understanding.    BP 135/91 mmHg  Pulse 102  Temp(Src) 99.5 F (37.5 C)  Ht 5\' 3"  (1.6 m)  Wt 171 lb (77.565 kg)  BMI 30.30 kg/m2  LMP 05/31/2015

## 2015-07-03 ENCOUNTER — Other Ambulatory Visit: Payer: Self-pay | Admitting: Obstetrics

## 2015-07-03 DIAGNOSIS — O10911 Unspecified pre-existing hypertension complicating pregnancy, first trimester: Secondary | ICD-10-CM

## 2015-07-03 MED ORDER — LABETALOL HCL 200 MG PO TABS: 200.0000 mg | ORAL_TABLET | Freq: Three times a day (TID) | ORAL | Status: DC

## 2015-07-04 ENCOUNTER — Telehealth: Payer: Self-pay | Admitting: Obstetrics

## 2015-07-05 ENCOUNTER — Telehealth: Payer: Self-pay | Admitting: *Deleted

## 2015-07-05 NOTE — Telephone Encounter (Signed)
Patient in office on 07/02/2015 and found out she was pregnant. Patient consulted with Dr. Jodi Mourning and was given a new prescription for her blood pressure. Patient was previously on Methyldopa. Patient states the new prescription is for Labetalol. Patient has been reading up on the medication and is concerned about taking the medication. Patient states she read that the Labetalol is a category C verus the Methyldopa being a category B. Patient is also concerned with the possible side effects from the medication. Patient states she is very sensitive to medication. Patient is requesting to continue the Methyldopa during the pregnancy and adjust the dosage as needed. Patient was on Methyldopa 500 mg po twice daily.

## 2015-07-06 ENCOUNTER — Encounter: Payer: Self-pay | Admitting: Obstetrics

## 2015-07-06 ENCOUNTER — Other Ambulatory Visit: Payer: Self-pay | Admitting: Obstetrics

## 2015-07-06 NOTE — Telephone Encounter (Signed)
Labetalol is recommended.

## 2015-07-06 NOTE — Telephone Encounter (Signed)
07/06/2015 - paperwork pick ed up

## 2015-07-09 NOTE — Telephone Encounter (Signed)
Attempted to contact the patient and left message for patient to contact the office.  

## 2015-07-16 ENCOUNTER — Other Ambulatory Visit: Payer: Self-pay | Admitting: Obstetrics

## 2015-07-16 ENCOUNTER — Telehealth: Payer: Self-pay | Admitting: *Deleted

## 2015-07-16 DIAGNOSIS — O219 Vomiting of pregnancy, unspecified: Secondary | ICD-10-CM

## 2015-07-16 MED ORDER — DOXYLAMINE SUCCINATE (SLEEP) 25 MG PO TABS: 25.0000 mg | ORAL_TABLET | Freq: Every evening | ORAL | Status: DC | PRN

## 2015-07-16 MED ORDER — VITAMIN B-6 50 MG PO TABS: ORAL_TABLET | ORAL | Status: DC

## 2015-07-16 NOTE — Telephone Encounter (Signed)
Patient is reporting she has started nausea and vomiting. She had stopped spotting- but has started back- still dark/brownish in color and patient only needs a liner for it. What can we do for her nausea?

## 2015-07-16 NOTE — Telephone Encounter (Signed)
B6 and Doxylamine succinate Rx. Patient called.

## 2015-07-17 NOTE — Telephone Encounter (Signed)
Call to patient- LM on patient- VM- OTC natural alternative B6 and Unisom regimen. Patient to call back if she has questions

## 2015-07-20 ENCOUNTER — Encounter (HOSPITAL_COMMUNITY): Payer: Self-pay | Admitting: *Deleted

## 2015-07-20 ENCOUNTER — Inpatient Hospital Stay (HOSPITAL_COMMUNITY): Payer: 59

## 2015-07-20 ENCOUNTER — Inpatient Hospital Stay (HOSPITAL_COMMUNITY)
Admission: EM | Admit: 2015-07-20 | Discharge: 2015-07-20 | Disposition: A | Discharge status: Home / Self Care | Payer: 59 | Source: Ambulatory Visit | Attending: Obstetrics | Admitting: Obstetrics

## 2015-07-20 DIAGNOSIS — O209 Hemorrhage in early pregnancy, unspecified: Secondary | ICD-10-CM

## 2015-07-20 DIAGNOSIS — Z3493 Encounter for supervision of normal pregnancy, unspecified, third trimester: Secondary | ICD-10-CM | POA: Insufficient documentation

## 2015-07-20 LAB — CBC
HEMATOCRIT: 38.3 % (ref 36.0–46.0)
Hemoglobin: 13 g/dL (ref 12.0–15.0)
MCH: 30.1 pg (ref 26.0–34.0)
MCHC: 33.9 g/dL (ref 30.0–36.0)
MCV: 88.7 fL (ref 78.0–100.0)
PLATELETS: 190 10*3/uL (ref 150–400)
RBC: 4.32 MIL/uL (ref 3.87–5.11)
RDW: 15 % (ref 11.5–15.5)
WBC: 10.7 10*3/uL — ABNORMAL HIGH (ref 4.0–10.5)

## 2015-07-20 LAB — WET PREP, GENITAL
Clue Cells Wet Prep HPF POC: NONE SEEN
Trich, Wet Prep: NONE SEEN
Yeast Wet Prep HPF POC: NONE SEEN

## 2015-07-20 LAB — URINALYSIS, ROUTINE W REFLEX MICROSCOPIC
BILIRUBIN URINE: NEGATIVE
Glucose, UA: NEGATIVE mg/dL
Ketones, ur: NEGATIVE mg/dL
LEUKOCYTES UA: NEGATIVE
NITRITE: NEGATIVE
PH: 7 (ref 5.0–8.0)
Protein, ur: NEGATIVE mg/dL
SPECIFIC GRAVITY, URINE: 1.01 (ref 1.005–1.030)
Urobilinogen, UA: 0.2 mg/dL (ref 0.0–1.0)

## 2015-07-20 LAB — HCG, QUANTITATIVE, PREGNANCY: HCG, BETA CHAIN, QUANT, S: 166880 m[IU]/mL — AB (ref <5)

## 2015-07-20 LAB — URINE MICROSCOPIC-ADD ON

## 2015-07-20 LAB — OB RESULTS CONSOLE GC/CHLAMYDIA: Gonorrhea: NEGATIVE

## 2015-07-20 MED ORDER — RHO D IMMUNE GLOBULIN 1500 UNIT/2ML IJ SOSY
300.0000 ug | PREFILLED_SYRINGE | Freq: Once | INTRAMUSCULAR | Status: AC
  Administered 2015-07-20: 300 ug via INTRAMUSCULAR

## 2015-07-20 NOTE — Telephone Encounter (Signed)
Attempted to contact patient and left message for patient to contact the office.

## 2015-07-20 NOTE — MAU Note (Signed)
Patient presents at [redacted] weeks gestation with c/o vaginal bleeding since this morning. Denies pain or discharge.

## 2015-07-20 NOTE — Discharge Instructions (Signed)
Vaginal Bleeding During Pregnancy, First Trimester °A small amount of bleeding (spotting) from the vagina is relatively common in early pregnancy. It usually stops on its own. Various things may cause bleeding or spotting in early pregnancy. Some bleeding may be related to the pregnancy, and some may not. In most cases, the bleeding is normal and is not a problem. However, bleeding can also be a sign of something serious. Be sure to tell your health care provider about any vaginal bleeding right away. °Some possible causes of vaginal bleeding during the first trimester include: °· Infection or inflammation of the cervix. °· Growths (polyps) on the cervix. °· Miscarriage or threatened miscarriage. °· Pregnancy tissue has developed outside of the uterus and in a fallopian tube (tubal pregnancy). °· Tiny cysts have developed in the uterus instead of pregnancy tissue (molar pregnancy). °HOME CARE INSTRUCTIONS  °Watch your condition for any changes. The following actions may help to lessen any discomfort you are feeling: °· Follow your health care provider's instructions for limiting your activity. If your health care provider orders bed rest, you may need to stay in bed and only get up to use the bathroom. However, your health care provider may allow you to continue light activity. °· If needed, make plans for someone to help with your regular activities and responsibilities while you are on bed rest. °· Keep track of the number of pads you use each day, how often you change pads, and how soaked (saturated) they are. Write this down. °· Do not use tampons. Do not douche. °· Do not have sexual intercourse or orgasms until approved by your health care provider. °· If you pass any tissue from your vagina, save the tissue so you can show it to your health care provider. °· Only take over-the-counter or prescription medicines as directed by your health care provider. °· Do not take aspirin because it can make you  bleed. °· Keep all follow-up appointments as directed by your health care provider. °SEEK MEDICAL CARE IF: °· You have any vaginal bleeding during any part of your pregnancy. °· You have cramps or labor pains. °· You have a fever, not controlled by medicine. °SEEK IMMEDIATE MEDICAL CARE IF:  °· You have severe cramps in your back or belly (abdomen). °· You pass large clots or tissue from your vagina. °· Your bleeding increases. °· You feel light-headed or weak, or you have fainting episodes. °· You have chills. °· You are leaking fluid or have a gush of fluid from your vagina. °· You pass out while having a bowel movement. °MAKE SURE YOU: °· Understand these instructions. °· Will watch your condition. °· Will get help right away if you are not doing well or get worse. °Document Released: 09/10/2005 Document Revised: 12/06/2013 Document Reviewed: 08/08/2013 °ExitCare® Patient Information ©2015 [employer]. This information is not intended to replace advice given to you by your health care provider. Make sure you discuss any questions you have with your health care provider. ° °Pelvic Rest °Pelvic rest is sometimes recommended for women when:  °· The placenta is partially or completely covering the opening of the cervix (placenta previa). °· There is bleeding between the uterine wall and the amniotic sac in the first trimester (subchorionic hemorrhage). °· The cervix begins to open without labor starting (incompetent cervix, cervical insufficiency). °· The labor is too early (preterm labor). °HOME CARE INSTRUCTIONS °· Do not have sexual intercourse, stimulation, or an orgasm. °· Do not use tampons, douche, or   put anything in the vagina. °· Do not lift anything over 10 pounds (4.5 kg). °· Avoid strenuous activity or straining your pelvic muscles. °SEEK MEDICAL CARE IF:  °· You have any vaginal bleeding during pregnancy. Treat this as a potential emergency. °· You have cramping pain felt low in the stomach (stronger  than menstrual cramps). °· You notice vaginal discharge (watery, mucus, or bloody). °· You have a low, dull backache. °· There are regular contractions or uterine tightening. °SEEK IMMEDIATE MEDICAL CARE IF: °You have vaginal bleeding and have placenta previa.  °Document Released: 03/28/2011 Document Revised: 02/23/2012 Document Reviewed: 03/28/2011 °ExitCare® Patient Information ©2015 [employer]. This information is not intended to replace advice given to you by your health care provider. Make sure you discuss any questions you have with your health care provider. ° ° °

## 2015-07-21 LAB — RH IG WORKUP (INCLUDES ABO/RH)
ABO/RH(D): A NEG
ANTIBODY SCREEN: NEGATIVE
GESTATIONAL AGE(WKS): 7.1
Unit division: 0

## 2015-07-23 ENCOUNTER — Encounter (HOSPITAL_COMMUNITY): Payer: Self-pay | Admitting: *Deleted

## 2015-07-23 LAB — GC/CHLAMYDIA PROBE AMP (~~LOC~~) NOT AT ARMC
CHLAMYDIA, DNA PROBE: NEGATIVE
Neisseria Gonorrhea: NEGATIVE

## 2015-07-23 NOTE — Telephone Encounter (Signed)
Patient spoke with Dr. Jodi Mourning on 07/20/15 regarding medication management.

## 2015-07-26 ENCOUNTER — Telehealth: Payer: Self-pay | Admitting: *Deleted

## 2015-07-26 ENCOUNTER — Other Ambulatory Visit: Payer: Self-pay | Admitting: Obstetrics

## 2015-07-26 DIAGNOSIS — O161 Unspecified maternal hypertension, first trimester: Secondary | ICD-10-CM

## 2015-07-26 DIAGNOSIS — O09523 Supervision of elderly multigravida, third trimester: Secondary | ICD-10-CM

## 2015-07-26 NOTE — Telephone Encounter (Signed)
Patient contacted the office stating she was informed on 07-20-15 she has a subchorionic hemorrhage. Patient states she has had some bright red watery bleeding that is now tapering off. Patient states currently she is having a dark red/brown like discharge. Patient states the bleeding was pretty heavy for a few hours last night. Patient states she never passed any clots and has not been associated with any pain. Patient states she has not been sexually active recently. Consulted with Dr. Jodi Mourning. Per Dr. Jodi Mourning patient advised to watch the bleed and to got to MAU if the bleeding increases and gets worse or if she starts passing clots or she begins to have pain with the bleeding. Patient advised she has been referred to MFM. Patient advised what MFM is and the reasons she is being referred. Patient advised she would be called and given an appointment date and time. At that point she should have a follow up ultrasound. Patient advised to keep her NOB appointment. Patient also advised to continue pelvis rest. Patient verbalized understanding.

## 2015-07-27 ENCOUNTER — Encounter: Payer: Self-pay | Admitting: Obstetrics

## 2015-07-30 ENCOUNTER — Telehealth: Payer: Self-pay

## 2015-07-30 NOTE — Telephone Encounter (Signed)
Told mother to have patient call me to reschedule her U/S appts

## 2015-07-31 ENCOUNTER — Telehealth: Payer: Self-pay

## 2015-07-31 NOTE — Telephone Encounter (Signed)
Patient's appts were changed to afternoon times - starting at 2pm on 08/28/15

## 2015-08-13 ENCOUNTER — Encounter: Payer: 59 | Admitting: Obstetrics

## 2015-08-13 ENCOUNTER — Encounter: Payer: Self-pay | Admitting: Obstetrics

## 2015-08-13 ENCOUNTER — Ambulatory Visit (INDEPENDENT_AMBULATORY_CARE_PROVIDER_SITE_OTHER): Payer: 59 | Admitting: Obstetrics

## 2015-08-13 VITALS — BP 121/79 | HR 91 | Temp 98.3°F | Wt 178.0 lb

## 2015-08-13 DIAGNOSIS — Z3481 Encounter for supervision of other normal pregnancy, first trimester: Secondary | ICD-10-CM

## 2015-08-13 LAB — POCT URINALYSIS DIPSTICK
Bilirubin, UA: NEGATIVE
Blood, UA: NEGATIVE
Glucose, UA: NORMAL
Ketones, UA: NEGATIVE
Leukocytes, UA: NEGATIVE
NITRITE UA: NEGATIVE
PH UA: 7
Protein, UA: NEGATIVE
Spec Grav, UA: 1.01
Urobilinogen, UA: NEGATIVE

## 2015-08-13 NOTE — Addendum Note (Signed)
Addended by: Willia Craze on: 08/13/2015 11:10 AM   Modules accepted: Orders

## 2015-08-13 NOTE — Progress Notes (Signed)
Subjective:    Courtney [name] is being seen today for her first obstetrical visit.  This is not a planned pregnancy. She is at [redacted]w[redacted]d gestation. Her obstetrical history is significant for advanced maternal age and chronic hypertension. Relationship with FOB: significant other, not living together. Patient does intend to breast feed. Pregnancy history fully reviewed.  The information documented in the HPI was reviewed and verified.  Menstrual History: OB History    Gravida Para Term Preterm AB TAB SAB Ectopic Multiple Living   2 1 1       1        Patient's last menstrual period was 05/31/2015.    Past Medical History  Diagnosis Date  . Hypertension   . Von Willebrand disease   . Allergy   . UTI (lower urinary tract infection)     Past Surgical History  Procedure Laterality Date  . Hemmeroid    . Gynecologic cryosurgery    . Cesarean section N/A 08/17/2013    Procedure: Primary Cesarean Section Delivery Baby Boy @ 4098, Apgars 9/10 ;  Surgeon: Lahoma Crocker, MD;  Location: [facility];  Service: Obstetrics;  Laterality: N/A;     (Not in a hospital admission) No Known Allergies  Social History  Substance Use Topics  . Smoking status: Never Smoker   . Smokeless tobacco: Never Used  . Alcohol Use: No    Family History  Problem Relation Age of Onset  . Diabetes Maternal Grandmother   . Hypertension Maternal Grandmother   . Cancer Maternal Grandfather   . Emphysema Maternal Grandfather   . Diabetes Paternal Grandmother   . Hypertension Paternal Grandmother   . Cancer Paternal Grandfather      Review of Systems Constitutional: negative for weight loss Gastrointestinal: negative for vomiting Genitourinary:negative for genital lesions and vaginal discharge and dysuria Musculoskeletal:negative for back pain Behavioral/Psych: negative for abusive relationship, depression, illegal drug usage and tobacco use    Objective:    BP 121/79 mmHg  Pulse 91  Temp(Src) 98.3  F (36.8 C)  Wt 178 lb (80.74 kg)  LMP 05/31/2015 General Appearance:    Alert, cooperative, no distress, appears stated age  Head:    Normocephalic, without obvious abnormality, atraumatic  Eyes:    PERRL, conjunctiva/corneas clear, EOM's intact, fundi    benign, both eyes  Ears:    Normal TM's and external ear canals, both ears  Nose:   Nares normal, septum midline, mucosa normal, no drainage    or sinus tenderness  Throat:   Lips, mucosa, and tongue normal; teeth and gums normal  Neck:   Supple, symmetrical, trachea midline, no adenopathy;    thyroid:  no enlargement/tenderness/nodules; no carotid   bruit or JVD  Back:     Symmetric, no curvature, ROM normal, no CVA tenderness  Lungs:     Clear to auscultation bilaterally, respirations unlabored  Chest Wall:    No tenderness or deformity   Heart:    Regular rate and rhythm, S1 and S2 normal, no murmur, rub   or gallop  Breast Exam:    No tenderness, masses, or nipple abnormality  Abdomen:     Soft, non-tender, bowel sounds active all four quadrants,    no masses, no organomegaly  Genitalia:    Normal female without lesion, discharge or tenderness  Extremities:   Extremities normal, atraumatic, no cyanosis or edema  Pulses:   2+ and symmetric all extremities  Skin:   Skin color, texture, turgor normal, no rashes or lesions  Lymph nodes:   Cervical, supraclavicular, and axillary nodes normal  Neurologic:   CNII-XII intact, normal strength, sensation and reflexes    throughout      Lab Review Urine pregnancy test Labs reviewed yes Radiologic studies reviewed yes Assessment:    Pregnancy at [redacted]w[redacted]d weeks    Plan:      Prenatal vitamins.  Counseling provided regarding continued use of seat belts, cessation of alcohol consumption, smoking or use of illicit drugs; infection precautions i.e., influenza/TDAP immunizations, toxoplasmosis,CMV, parvovirus, listeria and varicella; workplace safety, exercise during pregnancy; routine  dental care, safe medications, sexual activity, hot tubs, saunas, pools, travel, caffeine use, fish and methlymercury, potential toxins, hair treatments, varicose veins Weight gain recommendations per IOM guidelines reviewed: underweight/BMI< 18.5--> gain 28 - 40 lbs; normal weight/BMI 18.5 - 24.9--> gain 25 - 35 lbs; overweight/BMI 25 - 29.9--> gain 15 - 25 lbs; obese/BMI >30->gain  11 - 20 lbs Problem list reviewed and updated. FIRST/CF mutation testing/NIPT/QUAD SCREEN/fragile X/Ashkenazi Jewish population testing/Spinal muscular atrophy discussed: requested. Role of ultrasound in pregnancy discussed; fetal survey: requested. Amniocentesis discussed: not indicated. VBAC calculator score: VBAC consent form provided No orders of the defined types were placed in this encounter.   No orders of the defined types were placed in this encounter.    Follow up in 4 weeks.

## 2015-08-13 NOTE — Addendum Note (Signed)
Addended by: Lewie Loron D on: 08/13/2015 11:01 AM   Modules accepted: Orders

## 2015-08-14 ENCOUNTER — Encounter (HOSPITAL_COMMUNITY): Payer: Self-pay | Admitting: Obstetrics

## 2015-08-16 ENCOUNTER — Other Ambulatory Visit: Payer: Self-pay | Admitting: Obstetrics

## 2015-08-16 DIAGNOSIS — B373 Candidiasis of vulva and vagina: Secondary | ICD-10-CM

## 2015-08-16 DIAGNOSIS — B3731 Acute candidiasis of vulva and vagina: Secondary | ICD-10-CM

## 2015-08-16 LAB — SURESWAB, VAGINOSIS/VAGINITIS PLUS
Atopobium vaginae: NOT DETECTED Log (cells/mL)
C. albicans, DNA: DETECTED — AB
C. glabrata, DNA: NOT DETECTED
C. parapsilosis, DNA: NOT DETECTED
C. trachomatis RNA, TMA: NOT DETECTED
C. tropicalis, DNA: NOT DETECTED
GARDNERELLA VAGINALIS: NOT DETECTED Log (cells/mL)
MEGASPHAERA SPECIES: NOT DETECTED Log (cells/mL)
N. gonorrhoeae RNA, TMA: NOT DETECTED
T. vaginalis RNA, QL TMA: NOT DETECTED

## 2015-08-16 MED ORDER — TERCONAZOLE 0.4 % VA CREA: 1.0000 | TOPICAL_CREAM | Freq: Every day | VAGINAL | Status: DC

## 2015-08-24 ENCOUNTER — Ambulatory Visit (HOSPITAL_COMMUNITY): Payer: 59

## 2015-08-28 ENCOUNTER — Other Ambulatory Visit (HOSPITAL_COMMUNITY): Payer: Self-pay | Admitting: *Deleted

## 2015-08-28 ENCOUNTER — Ambulatory Visit (HOSPITAL_COMMUNITY)
Admission: RE | Admit: 2015-08-28 | Discharge: 2015-08-28 | Disposition: A | Discharge status: Home / Self Care | Payer: 59 | Source: Ambulatory Visit | Attending: Obstetrics | Admitting: Obstetrics

## 2015-08-28 ENCOUNTER — Encounter (HOSPITAL_COMMUNITY): Payer: Self-pay

## 2015-08-28 ENCOUNTER — Encounter (HOSPITAL_COMMUNITY): Payer: 59

## 2015-08-28 ENCOUNTER — Other Ambulatory Visit: Payer: Self-pay | Admitting: Obstetrics

## 2015-08-28 DIAGNOSIS — O161 Unspecified maternal hypertension, first trimester: Secondary | ICD-10-CM

## 2015-08-28 DIAGNOSIS — O09521 Supervision of elderly multigravida, first trimester: Secondary | ICD-10-CM

## 2015-08-28 DIAGNOSIS — O3421 Maternal care for scar from previous cesarean delivery: Secondary | ICD-10-CM | POA: Diagnosis not present

## 2015-08-28 DIAGNOSIS — O09523 Supervision of elderly multigravida, third trimester: Secondary | ICD-10-CM

## 2015-08-28 DIAGNOSIS — Z3A13 13 weeks gestation of pregnancy: Secondary | ICD-10-CM | POA: Insufficient documentation

## 2015-08-28 DIAGNOSIS — Z3A12 12 weeks gestation of pregnancy: Secondary | ICD-10-CM

## 2015-08-28 DIAGNOSIS — O09529 Supervision of elderly multigravida, unspecified trimester: Secondary | ICD-10-CM

## 2015-08-28 DIAGNOSIS — Z369 Encounter for antenatal screening, unspecified: Secondary | ICD-10-CM

## 2015-08-28 DIAGNOSIS — O10911 Unspecified pre-existing hypertension complicating pregnancy, first trimester: Secondary | ICD-10-CM | POA: Insufficient documentation

## 2015-08-28 NOTE — Consult Note (Signed)
Maternal Fetal Medicine Consultation  Requesting Provider(s): Baltazar Najjar, MD  Reason for consultation: Advanced maternal age, chronic hypertension on Labetalol  HPI: Courtney [name] is a 34 yo G2P1001, EDD 03/03/2016 who is currently at 13w 1d seen for consultation due to chronic hypertension and advanced maternal age.  [name] is currently on Labetalol 200 mg every 8 hours.  She has no known end organ disease due to hypertension.  Her past OB history is remarkable for a prior term C-section due to arrest of descent.  She reports that her blood pressures were mildly elevated during the last month of her pregnancy and during her labor, but that she was never given a diagnosis of preeclampsia.  The patient reports that she underwent a work up for possible von Willebrand's disease as a teenager when she had some heavy menses and mild anemia.  Her initial blood work was equivocal and she never had a formal hematology evaluation or treatment with this disorder.  She underwent a complete evaluation during her last pregnancy - von Willebrand antigen, Factor VIII ristocetin and Factor VII assays were all within normal limits.  She had not had any bleeding problems or abnormally heavy menses since that time.  The patient was seen in MAU in early pregnancy due to vaginal bleeding and was diagnosed with a subchorionic hematoma.  Since that time, she has not had any additional vaginal bleeding.  She is without complaints today.    OB History: OB History    Gravida Para Term Preterm AB TAB SAB Ectopic Multiple Living   2 1 1       1       PMH:  Past Medical History  Diagnosis Date  . Hypertension   . Von Willebrand disease   . Allergy   . UTI (lower urinary tract infection)     PSH:  Past Surgical History  Procedure Laterality Date  . Hemmeroid    . Gynecologic cryosurgery    . Cesarean section N/A 08/17/2013    Procedure: Primary Cesarean Section Delivery Baby Boy @ 1497, Apgars  9/10 ;  Surgeon: Lahoma Crocker, MD;  Location: [facility];  Service: Obstetrics;  Laterality: N/A;   Meds:  Current Outpatient Prescriptions on File Prior to Encounter  Medication Sig Dispense Refill  . doxylamine, Sleep, (UNISOM) 25 MG tablet Take 1 tablet (25 mg total) by mouth at bedtime as needed. 30 tablet 5  . labetalol (NORMODYNE) 200 MG tablet Take 1 tablet (200 mg total) by mouth every 8 (eight) hours. 90 tablet 8  . Prenatal Vit-Fe Fumarate-FA (MULTIVITAMIN-PRENATAL) 27-0.8 MG TABS tablet Take 1 tablet by mouth daily at 12 noon.    . pyridOXINE (VITAMIN B-6) 50 MG tablet Take 1 tablet po bid. 60 tablet 5  . terconazole (TERAZOL 7) 0.4 % vaginal cream Place 1 applicator vaginally at bedtime. (Patient not taking: Reported on 08/28/2015) 45 g 0   No current facility-administered medications on file prior to encounter.   Allergies: No Known Allergies  FH:  Family History  Problem Relation Age of Onset  . Diabetes Maternal Grandmother   . Hypertension Maternal Grandmother   . Cancer Maternal Grandfather   . Emphysema Maternal Grandfather   . Diabetes Paternal Grandmother   . Hypertension Paternal Grandmother   . Cancer Paternal Grandfather     Soc:  Social History   Social History  . Marital Status: Married    Spouse Name: N/A  . Number of Children: 1  . Years of Education:  16   Occupational History  . Customer service rep    Social History Main Topics  . Smoking status: Never Smoker   . Smokeless tobacco: Never Used  . Alcohol Use: No  . Drug Use: No  . Sexual Activity:    Partners: Male    Museum/gallery curator: , None   Other Topics Concern  . Not on file   Social History Narrative   Fun: play with her child   Denies religious beliefs that would effect health care.     PE: 176 lbs, 122/63, 89   GEN: well-appearing female ABD: gravid, NT  Ultrasound: Single IUP at 12w 5d Suboptimal views of the NT were obtained.  Unable to measure nasal bone  due to fetal position. The previously noted subchorionic hemorrhage is not appreciated on today's study.  After counseling, the patient elected to undergo cell free fetal DNA (Panorama)  A/P: 1) Single IUP at 13w 1d  2) Hx of chronic hypertension - Ms. [name] is currently on Labetalol 200 mg q 8 hours and her blood pressures appear to be fairly well-controlled at this time.  We briefly discussed potential risks of other hypertensive disorders of pregnancy (CHTN with superimposed preeclampsia) and increased risk of fetal growth restriction.  If not already performed, would recommend a baseline 24-hr urine protein and preeclampsia labs.  3) ? History of von Willebrand's disease - based on the patient's history, feel that this is a soft call.  She underwent a thorough work up during her last pregnancy - normal von Willebrand antigen, Factor VIII ristocetin and normal factor VIII assay.  Do not feel that any additional work up is needed.  4) Advanced maternal age - unable to measure an adequate NT.  The patient elected to undergo cell free fetal DNA.  See separate note from Retail buyer.  Recommend detailed ultrasound in 6 weeks.  Recommendations: 1) Cell free fetal DNA today at patient request (Panorama) 2) Detailed ultrasound at 18-20 weeks 3) Baseline 24-hr urine protein and preeclampsia labs 4) Serial growth scans every 4-6 weeks 5) Antenatal testing beginning at 32 weeks due to [facility] 6) Delivery no earlier than 38 weeks (39 weeks if overall well controlled and testing is reassuring)    Thank you for the opportunity to be a part of the care of Courtney [name]. Please contact our office if we can be of further assistance.   I spent approximately 30 minutes with this patient with over 50% of time spent in face-to-face counseling.  Benjaman Lobe, MD Maternal Fetal Medicine

## 2015-08-29 ENCOUNTER — Ambulatory Visit (HOSPITAL_COMMUNITY): Payer: 59

## 2015-08-29 ENCOUNTER — Other Ambulatory Visit: Payer: Self-pay | Admitting: Certified Nurse Midwife

## 2015-08-31 DIAGNOSIS — Z3A13 13 weeks gestation of pregnancy: Secondary | ICD-10-CM | POA: Insufficient documentation

## 2015-08-31 DIAGNOSIS — O09529 Supervision of elderly multigravida, unspecified trimester: Secondary | ICD-10-CM | POA: Insufficient documentation

## 2015-08-31 NOTE — Progress Notes (Signed)
Genetic Counseling  High-Risk Gestation Note  Appointment Date:  08/31/2015 Referred By: Shelly Bombard, MD Date of Birth:  1981/03/25    Pregnancy History: G2P1001 Estimated Date of Delivery: 03/03/16 Estimated Gestational Age: [redacted]w[redacted]d Attending: Viann Fish, MD   Courtney [name] was seen for genetic counseling because of a maternal age of 34 y.o..   In summary:  Reviewed AMA and associated risks for fetal aneuploidy  Discussed screening and diagnostic testing options  Patient elected to have NIPS (Panorama) and an NT ultrasound today  Patient is scheduled to return for a detailed anatomy ultrasound    She was counseled regarding maternal age and the association with risk for chromosome conditions due to nondisjunction with aging of the ova.   We reviewed chromosomes, nondisjunction, and the associated 1 in 100 risk for fetal aneuploidy related to a maternal age of 34 y.o. at [redacted]w[redacted]d gestation.  She was counseled that the risk for aneuploidy decreases as gestational age increases, accounting for those pregnancies which spontaneously abort.  We specifically discussed Down syndrome (trisomy 33), trisomies 69 and 15, and sex chromosome aneuploidies (47,XXX and 47,XXY) including the common features and prognoses of each.   We reviewed available screening options including First Screen, Quad screen, noninvasive prenatal screening (NIPS)/cell free DNA (cfDNA) testing, and detailed ultrasound.  She was counseled that screening tests are used to modify a patient's a priori risk for aneuploidy, typically based on age. This estimate provides a pregnancy specific risk assessment. We reviewed the benefits and limitations of each option. Specifically, we discussed the conditions for which each test screens, the detection rates, and false positive rates of each. She was also counseled regarding diagnostic testing via CVS and amniocentesis. We reviewed the associated risks of complications,  including spontaneous pregnancy loss.  After consideration of all the options, she elected to proceed with NIPS. Those results will be available in 5-7 days.  She also expressed interest in pursuing a nuchal translucency ultrasound, which was performed today.  The report will be documented separately.  The patient would like to return for a detailed ultrasound at ~18+ weeks gestation.  This appointment was scheduled today.  She understands that screening tests cannot rule out all birth defects or genetic syndromes. The patient was advised of this limitation and states she still does not want additional testing at this time.    Courtney [name] was provided with written information regarding sickle cell anemia (SCA) including the carrier frequency and incidence in the African-American population, the availability of carrier testing and prenatal diagnosis if indicated.  In addition, we discussed that hemoglobinopathies are routinely screened for as part of the [facility] newborn screening panel.  She declined hemoglobin electrophoresis today.  Both family histories were reviewed and found to be contributory for Courtney [name]'s maternal first cousin having apparently isolated cleft palate. This relative does not have other birth defects, developmental delay, or medical concerns. She was counseled that the incidence of isolated cleft palate is estimated to be 1 in 2,500. Cleft palate is most often an isolated condition, but can be present as one feature of an underlying genetic condition in combination with other birth defects or features. Clefting can also be associated with maternal environmental exposures during pregnancy. When there is no syndrome or known underlying factor as the cause, multifactorial inheritance is suspected involving a combination of genetic and environmental contributing factors. In the case of multifactorial inheritance, given the reported family history of a 3rd degree relative with  isolated  cleft palate, recurrence risk for isolated cleft palate in the current pregnancy is not estimated to be increased.  If this relative's cleft palate were due to a specific underlying genetic cause, this recurrence risk estimate may change. Of note, Courtney [name]'s maternal grandfather had retinal detachment during early adulthood. The cause is unknown. We discussed that CP and retinal detachment can be seen in Stickler syndrome. There were no other features of Stickler syndrome described for family members. A second trimester targeted ultrasound may detect orofacial clefts.  The remainder of both family histories were noncontributory for birth defects, intellectual disability, and known genetic conditions. Without further information regarding the provided family history, an accurate genetic risk cannot be calculated. Further genetic counseling is warranted if more information is obtained.  Courtney [name] denied exposure to environmental toxins or chemical agents. She denied the use of alcohol, tobacco or street drugs. She denied significant viral illnesses during the course of her pregnancy.   I counseled this patient regarding the above risks and available options.  The approximate face-to-face time with the genetic counselor was 40 minutes.  Sharyne Richters, MS  Certified Genetic Counselor

## 2015-09-04 ENCOUNTER — Telehealth (HOSPITAL_COMMUNITY): Payer: Self-pay | Admitting: MS"

## 2015-09-04 NOTE — Telephone Encounter (Signed)
[employer] genetic counseling intern, Elouise Munroe called Babs Venzen-Cherry to discuss her cell free fetal DNA test results under my direct supervision.  Ms. Daveah Varone had Panorama testing through [facility].  Testing was offered because of maternal age.  The patient was identified by name and DOB.  We reviewed that these are within normal limits, showing a less than 1 in 10,000 risk for trisomies 21, 18 and 13, and monosomy X (Turner syndrome).  In addition, the risk for triploidy/vanishing twin and sex chromosome trisomies (47,XXX and 47,XXY) was also low risk. We reviewed that this testing identifies > 99% of pregnancies with trisomy 10, trisomy 68, sex chromosome trisomies (47,XXX and 47,XXY), and triploidy. The detection rate for trisomy 18 is 96%.  The detection rate for monosomy X is ~92%.  The false positive rate is <0.1% for all conditions. Testing was also consistent with female fetal sex.  The patient did wish to know fetal sex.  She understands that this testing does not identify all genetic conditions.  All questions were answered to her satisfaction, she was encouraged to call with additional questions or concerns.  Chipper Oman, MS Certified Genetic Counselor 09/04/2015 5:08 PM

## 2015-09-05 ENCOUNTER — Other Ambulatory Visit (HOSPITAL_COMMUNITY): Payer: Self-pay | Admitting: Obstetrics

## 2015-09-07 ENCOUNTER — Encounter: Payer: Self-pay | Admitting: *Deleted

## 2015-09-11 ENCOUNTER — Ambulatory Visit (INDEPENDENT_AMBULATORY_CARE_PROVIDER_SITE_OTHER): Payer: 59 | Admitting: Obstetrics

## 2015-09-11 VITALS — BP 131/76 | HR 96 | Temp 98.3°F | Wt 179.0 lb

## 2015-09-11 DIAGNOSIS — O219 Vomiting of pregnancy, unspecified: Secondary | ICD-10-CM

## 2015-09-11 DIAGNOSIS — O0942 Supervision of pregnancy with grand multiparity, second trimester: Secondary | ICD-10-CM

## 2015-09-11 LAB — POCT URINALYSIS DIPSTICK
Bilirubin, UA: NEGATIVE
GLUCOSE UA: NEGATIVE
Ketones, UA: NEGATIVE
Leukocytes, UA: NEGATIVE
Nitrite, UA: NEGATIVE
Spec Grav, UA: <=1.005
Urobilinogen, UA: NEGATIVE
pH, UA: 6

## 2015-09-11 MED ORDER — METOCLOPRAMIDE HCL 10 MG PO TABS: 10.0000 mg | ORAL_TABLET | Freq: Three times a day (TID) | ORAL | Status: DC

## 2015-09-11 NOTE — Progress Notes (Signed)
Patient is having lower pelvic pain.

## 2015-09-12 ENCOUNTER — Encounter: Payer: Self-pay | Admitting: Obstetrics

## 2015-09-12 LAB — HIV ANTIBODY (ROUTINE TESTING W REFLEX): HIV: NONREACTIVE

## 2015-09-12 LAB — CULTURE, OB URINE: Colony Count: 50000

## 2015-09-12 LAB — TSH: TSH: 0.893 u[IU]/mL (ref 0.350–4.500)

## 2015-09-12 LAB — VARICELLA ZOSTER ANTIBODY, IGG: Varicella IgG: 18.08 Index (ref <135.00)

## 2015-09-12 LAB — VITAMIN D 25 HYDROXY (VIT D DEFICIENCY, FRACTURES): Vit D, 25-Hydroxy: 31 ng/mL (ref 30–100)

## 2015-09-12 NOTE — Progress Notes (Signed)
  Subjective:    Courtney [name] is a 34 y.o. female being seen today for her obstetrical visit. She is at [redacted]w[redacted]d gestation. Patient reports: nausea.  Problem List Items Addressed This Visit    None    Visit Diagnoses    Supervision of pregnancy with grand multiparity in second trimester    -  Primary    Relevant Orders    POCT urinalysis dipstick (Completed)    Obstetric panel    HIV antibody    Hemoglobinopathy evaluation    Varicella zoster antibody, IgG    Vit D  25 hydroxy (rtn osteoporosis monitoring)    Culture, OB Urine    TSH    Nausea and vomiting during pregnancy prior to [redacted] weeks gestation        Relevant Medications    metoCLOPramide (REGLAN) 10 MG tablet      Patient Active Problem List   Diagnosis Date Noted  . [redacted] weeks gestation of pregnancy   . Advanced maternal age in multigravida   . Essential hypertension 03/21/2015  . Depressive disorder, not elsewhere classified 09/06/2013  . Cesarean delivery delivered 08/20/2013  . Excessive fetal growth affecting management of mother, antepartum 07/27/2013  . Rh negative state in antepartum period 05/23/2013  . Normal IUP (intrauterine pregnancy) on prenatal ultrasound 12/16/2012  . Uterine fibroid in pregnancy 12/16/2012  . Vaginal bleeding in pregnancy 12/16/2012  . Subchorionic hemorrhage 12/16/2012    Objective:     BP 131/76 mmHg  Pulse 96  Temp(Src) 98.3 F (36.8 C)  Wt 179 lb (81.194 kg)  LMP 05/31/2015 Uterine Size: Below umbilicus     Assessment:    Pregnancy @ [redacted]w[redacted]d  weeks Doing well    Plan:    Problem list reviewed and updated. Labs reviewed.  Follow up in 4 weeks. FIRST/CF mutation testing/NIPT/QUAD SCREEN/fragile X/Ashkenazi Jewish population testing/Spinal muscular atrophy discussed: requested. Role of ultrasound in pregnancy discussed; fetal survey: requested. Amniocentesis discussed: declined.

## 2015-09-13 LAB — OBSTETRIC PANEL
Antibody Screen: POSITIVE — AB
Basophils Absolute: 0 10*3/uL (ref 0.0–0.1)
Basophils Relative: 0 % (ref 0–1)
Eosinophils Absolute: 0.1 10*3/uL (ref 0.0–0.7)
Eosinophils Relative: 1 % (ref 0–5)
HCT: 39.8 % (ref 36.0–46.0)
Hemoglobin: 13.2 g/dL (ref 12.0–15.0)
Hepatitis B Surface Ag: NEGATIVE
Lymphocytes Relative: 16 % (ref 12–46)
Lymphs Abs: 2 10*3/uL (ref 0.7–4.0)
MCH: 30.1 pg (ref 26.0–34.0)
MCHC: 33.2 g/dL (ref 30.0–36.0)
MCV: 90.9 fL (ref 78.0–100.0)
MPV: 10.5 fL (ref 8.6–12.4)
Monocytes Absolute: 0.9 10*3/uL (ref 0.1–1.0)
Monocytes Relative: 7 % (ref 3–12)
Neutro Abs: 9.7 10*3/uL — ABNORMAL HIGH (ref 1.7–7.7)
Neutrophils Relative %: 76 % (ref 43–77)
Platelets: 207 10*3/uL (ref 150–400)
RBC: 4.38 MIL/uL (ref 3.87–5.11)
RDW: 15.3 % (ref 11.5–15.5)
Rh Type: NEGATIVE
Rubella: 1.93 Index — ABNORMAL HIGH (ref <0.90)
WBC: 12.7 10*3/uL — ABNORMAL HIGH (ref 4.0–10.5)

## 2015-09-13 LAB — HEMOGLOBINOPATHY EVALUATION
Hemoglobin Other: 0 %
Hgb A2 Quant: 1.8 % — ABNORMAL LOW (ref 2.2–3.2)
Hgb A: 98.2 % — ABNORMAL HIGH (ref 96.8–97.8)
Hgb F Quant: 0 % (ref 0.0–2.0)
Hgb S Quant: 0 %

## 2015-09-13 LAB — ANTIBODY TITER (PRENATAL TITER): Ab Titer: <2

## 2015-09-13 LAB — PRENATAL ANTIBODY IDENTIFICATION

## 2015-10-09 ENCOUNTER — Ambulatory Visit (HOSPITAL_COMMUNITY): Payer: 59

## 2015-10-09 ENCOUNTER — Encounter (HOSPITAL_COMMUNITY): Payer: Self-pay

## 2015-10-09 ENCOUNTER — Ambulatory Visit (HOSPITAL_COMMUNITY)
Admission: RE | Admit: 2015-10-09 | Discharge: 2015-10-09 | Disposition: A | Discharge status: Home / Self Care | Payer: 59 | Source: Ambulatory Visit | Attending: Obstetrics | Admitting: Obstetrics

## 2015-10-09 DIAGNOSIS — O09529 Supervision of elderly multigravida, unspecified trimester: Secondary | ICD-10-CM | POA: Diagnosis present

## 2015-10-10 ENCOUNTER — Other Ambulatory Visit (HOSPITAL_COMMUNITY): Payer: Self-pay | Admitting: *Deleted

## 2015-10-10 DIAGNOSIS — O09529 Supervision of elderly multigravida, unspecified trimester: Secondary | ICD-10-CM

## 2015-10-11 ENCOUNTER — Encounter: Payer: Self-pay | Admitting: Obstetrics

## 2015-10-11 ENCOUNTER — Ambulatory Visit (INDEPENDENT_AMBULATORY_CARE_PROVIDER_SITE_OTHER): Payer: 59 | Admitting: Obstetrics

## 2015-10-11 VITALS — BP 115/67 | HR 87 | Wt 188.0 lb

## 2015-10-11 DIAGNOSIS — Z3482 Encounter for supervision of other normal pregnancy, second trimester: Secondary | ICD-10-CM

## 2015-10-11 LAB — POCT URINALYSIS DIPSTICK
Bilirubin, UA: NEGATIVE
Blood, UA: NEGATIVE
Glucose, UA: NEGATIVE
Ketones, UA: NEGATIVE
LEUKOCYTES UA: NEGATIVE
NITRITE UA: NEGATIVE
PROTEIN UA: NEGATIVE
UROBILINOGEN UA: NEGATIVE
pH, UA: 7

## 2015-10-11 NOTE — Progress Notes (Signed)
Subjective:    Courtney [name] is a 34 y.o. female being seen today for her obstetrical visit. She is at [redacted]w[redacted]d gestation. Patient reports: no complaints . Fetal movement: normal.  Problem List Items Addressed This Visit    None    Visit Diagnoses    Encounter for supervision of other normal pregnancy in second trimester    -  Primary    Relevant Orders    POCT urinalysis dipstick      Patient Active Problem List   Diagnosis Date Noted  . [redacted] weeks gestation of pregnancy   . Advanced maternal age in multigravida   . Essential hypertension 03/21/2015  . Depressive disorder, not elsewhere classified 09/06/2013  . Cesarean delivery delivered 08/20/2013  . Excessive fetal growth affecting management of mother, antepartum 07/27/2013  . Rh negative state in antepartum period 05/23/2013  . Normal IUP (intrauterine pregnancy) on prenatal ultrasound 12/16/2012  . Uterine fibroid in pregnancy 12/16/2012  . Vaginal bleeding in pregnancy 12/16/2012  . Subchorionic hemorrhage 12/16/2012   Objective:    BP 115/67 mmHg  Pulse 87  Wt 188 lb (85.276 kg)  LMP 05/31/2015 FHT: 150 BPM  Uterine Size: size greater than dates     Assessment:    Pregnancy @ [redacted]w[redacted]d    Plan:    Signs and symptoms of preterm labor: discussed.  Labs, problem list reviewed and updated 2 hr GTT planned Follow up in 4 weeks.

## 2015-11-07 ENCOUNTER — Ambulatory Visit (INDEPENDENT_AMBULATORY_CARE_PROVIDER_SITE_OTHER): Payer: 59 | Admitting: Obstetrics

## 2015-11-07 VITALS — BP 106/65 | HR 100 | Temp 98.0°F

## 2015-11-07 DIAGNOSIS — Z3482 Encounter for supervision of other normal pregnancy, second trimester: Secondary | ICD-10-CM

## 2015-11-09 ENCOUNTER — Encounter: Payer: Self-pay | Admitting: Obstetrics

## 2015-11-09 NOTE — Progress Notes (Signed)
Subjective:    Courtney [name] is a 34 y.o. female being seen today for her obstetrical visit. She is at [redacted]w[redacted]d gestation. Patient reports: no complaints . Fetal movement: normal.  Problem List Items Addressed This Visit    None    Visit Diagnoses    Supervision of other normal pregnancy, antepartum, second trimester    -  Primary    Relevant Orders    POCT urinalysis dipstick      Patient Active Problem List   Diagnosis Date Noted  . [redacted] weeks gestation of pregnancy   . Advanced maternal age in multigravida   . Essential hypertension 03/21/2015  . Depressive disorder, not elsewhere classified 09/06/2013  . Cesarean delivery delivered 08/20/2013  . Excessive fetal growth affecting management of mother, antepartum 07/27/2013  . Rh negative state in antepartum period 05/23/2013  . Normal IUP (intrauterine pregnancy) on prenatal ultrasound 12/16/2012  . Uterine fibroid in pregnancy 12/16/2012  . Vaginal bleeding in pregnancy 12/16/2012  . Subchorionic hemorrhage 12/16/2012   Objective:    BP 106/65 mmHg  Pulse 100  Temp(Src) 98 F (36.7 C)  LMP 05/31/2015 FHT: 150 BPM  Uterine Size: size equals dates      Assessment:    Pregnancy @ [redacted]w[redacted]d    Plan:    OBGCT: ordered for next visit.  Labs, problem list reviewed and updated 2 hr GTT planned Follow up in 4 weeks.

## 2015-11-14 ENCOUNTER — Other Ambulatory Visit: Payer: Self-pay | Admitting: Certified Nurse Midwife

## 2015-11-23 ENCOUNTER — Encounter (HOSPITAL_COMMUNITY): Payer: Self-pay

## 2015-11-23 ENCOUNTER — Inpatient Hospital Stay (HOSPITAL_COMMUNITY)
Admission: AD | Admit: 2015-11-23 | Discharge: 2015-11-26 | DRG: 782 | Disposition: A | Discharge status: Home / Self Care | Payer: 59 | Source: Ambulatory Visit | Attending: Obstetrics | Admitting: Obstetrics

## 2015-11-23 ENCOUNTER — Other Ambulatory Visit (HOSPITAL_COMMUNITY): Payer: Self-pay | Admitting: Maternal and Fetal Medicine

## 2015-11-23 ENCOUNTER — Ambulatory Visit (HOSPITAL_COMMUNITY)
Admission: RE | Admit: 2015-11-23 | Discharge: 2015-11-23 | Disposition: A | Discharge status: Home / Self Care | Payer: 59 | Source: Ambulatory Visit | Attending: Obstetrics | Admitting: Obstetrics

## 2015-11-23 ENCOUNTER — Encounter (HOSPITAL_COMMUNITY): Payer: Self-pay | Admitting: *Deleted

## 2015-11-23 DIAGNOSIS — O99212 Obesity complicating pregnancy, second trimester: Secondary | ICD-10-CM

## 2015-11-23 DIAGNOSIS — E669 Obesity, unspecified: Secondary | ICD-10-CM | POA: Diagnosis present

## 2015-11-23 DIAGNOSIS — Z833 Family history of diabetes mellitus: Secondary | ICD-10-CM

## 2015-11-23 DIAGNOSIS — O10019 Pre-existing essential hypertension complicating pregnancy, unspecified trimester: Secondary | ICD-10-CM

## 2015-11-23 DIAGNOSIS — O162 Unspecified maternal hypertension, second trimester: Secondary | ICD-10-CM

## 2015-11-23 DIAGNOSIS — O09522 Supervision of elderly multigravida, second trimester: Secondary | ICD-10-CM

## 2015-11-23 DIAGNOSIS — Z3A25 25 weeks gestation of pregnancy: Secondary | ICD-10-CM | POA: Diagnosis not present

## 2015-11-23 DIAGNOSIS — O26873 Cervical shortening, third trimester: Secondary | ICD-10-CM | POA: Diagnosis not present

## 2015-11-23 DIAGNOSIS — Z8249 Family history of ischemic heart disease and other diseases of the circulatory system: Secondary | ICD-10-CM | POA: Diagnosis not present

## 2015-11-23 DIAGNOSIS — O09529 Supervision of elderly multigravida, unspecified trimester: Secondary | ICD-10-CM

## 2015-11-23 DIAGNOSIS — D680 Von Willebrand's disease: Secondary | ICD-10-CM | POA: Diagnosis present

## 2015-11-23 DIAGNOSIS — O3432 Maternal care for cervical incompetence, second trimester: Secondary | ICD-10-CM

## 2015-11-23 DIAGNOSIS — O26872 Cervical shortening, second trimester: Secondary | ICD-10-CM | POA: Diagnosis not present

## 2015-11-23 LAB — WET PREP, GENITAL
Clue Cells Wet Prep HPF POC: NONE SEEN
SPERM: NONE SEEN
TRICH WET PREP: NONE SEEN
YEAST WET PREP: NONE SEEN

## 2015-11-23 LAB — URINALYSIS, ROUTINE W REFLEX MICROSCOPIC
BILIRUBIN URINE: NEGATIVE
Glucose, UA: NEGATIVE mg/dL
KETONES UR: NEGATIVE mg/dL
NITRITE: NEGATIVE
PROTEIN: NEGATIVE mg/dL
Specific Gravity, Urine: 1.01 (ref 1.005–1.030)
pH: 6.5 (ref 5.0–8.0)

## 2015-11-23 LAB — TYPE AND SCREEN
ABO/RH(D): A NEG
Antibody Screen: NEGATIVE

## 2015-11-23 LAB — URINE MICROSCOPIC-ADD ON

## 2015-11-23 MED ORDER — MAGNESIUM SULFATE BOLUS VIA INFUSION
4.0000 g | Freq: Once | INTRAVENOUS | Status: AC
  Administered 2015-11-23: 4 g via INTRAVENOUS
  Filled 2015-11-23: qty 500

## 2015-11-23 MED ORDER — DOCUSATE SODIUM 100 MG PO CAPS
100.0000 mg | ORAL_CAPSULE | Freq: Every day | ORAL | Status: DC
  Administered 2015-11-24 – 2015-11-26 (×3): 100 mg via ORAL
  Filled 2015-11-23 (×3): qty 1

## 2015-11-23 MED ORDER — BETAMETHASONE SOD PHOS & ACET 6 (3-3) MG/ML IJ SUSP
12.0000 mg | Freq: Once | INTRAMUSCULAR | Status: AC
  Administered 2015-11-23: 12 mg via INTRAMUSCULAR
  Filled 2015-11-23: qty 2

## 2015-11-23 MED ORDER — ZOLPIDEM TARTRATE 5 MG PO TABS: 5.0000 mg | ORAL_TABLET | Freq: Every evening | ORAL | Status: DC | PRN

## 2015-11-23 MED ORDER — LACTATED RINGERS IV SOLN
INTRAVENOUS | Status: DC
  Administered 2015-11-23 – 2015-11-26 (×6): via INTRAVENOUS

## 2015-11-23 MED ORDER — CALCIUM CARBONATE ANTACID 500 MG PO CHEW: 2.0000 | CHEWABLE_TABLET | ORAL | Status: DC | PRN

## 2015-11-23 MED ORDER — BETAMETHASONE SOD PHOS & ACET 6 (3-3) MG/ML IJ SUSP
12.0000 mg | Freq: Once | INTRAMUSCULAR | Status: AC
  Administered 2015-11-24: 12 mg via INTRAMUSCULAR
  Filled 2015-11-23: qty 2

## 2015-11-23 MED ORDER — MAGNESIUM SULFATE 50 % IJ SOLN
2.0000 g/h | INTRAVENOUS | Status: DC
  Administered 2015-11-24 – 2015-11-26 (×3): 2 g/h via INTRAVENOUS
  Filled 2015-11-23 (×4): qty 80

## 2015-11-23 MED ORDER — ACETAMINOPHEN 325 MG PO TABS: 650.0000 mg | ORAL_TABLET | ORAL | Status: DC | PRN

## 2015-11-23 MED ORDER — PRENATAL MULTIVITAMIN CH
1.0000 | ORAL_TABLET | Freq: Every day | ORAL | Status: DC
  Administered 2015-11-24 – 2015-11-26 (×3): 1 via ORAL
  Filled 2015-11-23 (×3): qty 1

## 2015-11-23 NOTE — MAU Note (Signed)
Sent up from MFM because of a shortened cervix, denies lof/bleeding.

## 2015-11-23 NOTE — H&P (Signed)
Courtney [name] is a 34 y.o. female presenting for Shortened cervix on ultrasound. Maternal Medical History:  Fetal activity: Perceived fetal activity is normal.   Last perceived fetal movement was within the past hour.    Prenatal complications: no prenatal complications Prenatal Complications - Diabetes: none.    OB History    Gravida Para Term Preterm AB TAB SAB Ectopic Multiple Living   2 1 1       1      Past Medical History  Diagnosis Date  . Hypertension   . Von Willebrand disease ([place])   . Allergy   . UTI (lower urinary tract infection)    Past Surgical History  Procedure Laterality Date  . Hemmeroid    . Gynecologic cryosurgery    . Cesarean section N/A 08/17/2013    Procedure: Primary Cesarean Section Delivery Baby Boy @ [id number], Apgars 9/10 ;  Surgeon: Lahoma Crocker, MD;  Location: [facility];  Service: Obstetrics;  Laterality: N/A;   Family History: family history includes Cancer in her maternal grandfather and paternal grandfather; Diabetes in her maternal grandmother and paternal grandmother; Emphysema in her maternal grandfather; Hypertension in her maternal grandmother and paternal grandmother. Social History:  reports that she has never smoked. She has never used smokeless tobacco. She reports that she does not drink alcohol or use illicit drugs.   Prenatal Transfer Tool  Maternal Diabetes: No Genetic Screening: Normal Maternal Ultrasounds/Referrals: Normal Fetal Ultrasounds or other Referrals:  Referred to Materal Fetal Medicine  Maternal Substance Abuse:  No Significant Maternal Medications:  None Significant Maternal Lab Results:  None Other Comments:  None  Review of Systems  All other systems reviewed and are negative.     Blood pressure 124/64, pulse 98, temperature 98.7 F (37.1 C), temperature source Oral, resp. rate 18, height 5\' 3"  (1.6 m), weight 166 lb 9.6 oz (75.569 kg), last menstrual period 05/31/2015, currently  breastfeeding. Maternal Exam:  Abdomen: Patient reports no abdominal tenderness. Introitus: Normal vulva. Normal vagina.    Physical Exam  Constitutional: She is oriented to person, place, and time. She appears well-developed and well-nourished.  HENT:  Head: Normocephalic and atraumatic.  Eyes: Conjunctivae are normal. Pupils are equal, round, and reactive to light.  Neck: Normal range of motion. Neck supple.  Cardiovascular: Normal rate and regular rhythm.   Respiratory: Effort normal and breath sounds normal.  GI: Soft. Bowel sounds are normal.  Genitourinary: Vagina normal and uterus normal.  Musculoskeletal: Normal range of motion.  Neurological: She is alert and oriented to person, place, and time.  Skin: Skin is warm and dry.  Psychiatric: She has a normal mood and affect. Her behavior is normal. Judgment and thought content normal.    Prenatal labs: ABO, Rh: --/--/A NEG (12/09 1800) Antibody: NEG (12/09 1800) Rubella: 1.93 (09/27 1633) RPR: NON REAC (09/27 1633)  HBsAg: NEGATIVE (09/27 1633)  HIV: NONREACTIVE (09/27 1633)  GBS:     Assessment/Plan: 25 weeks.  Preterm cervical shortening.  Admit.  Betamethasone.  Magnesium sulfate CP prophylaxis.   Glory Graefe A 11/23/2015, 8:16 PM

## 2015-11-23 NOTE — ED Notes (Signed)
Report called to Valda Favia, RN.  Pt to go to MAU for further evaluation.

## 2015-11-23 NOTE — MAU Provider Note (Signed)
History     CSN: [id number]  Arrival date and time: 11/23/15 1512   First Provider Initiated Contact with Patient 11/23/15 1536      Chief Complaint  Patient presents with  . sent from MFM    HPI Ms. Courtney [name] is a 34 y.o. G2P1001 at [redacted]w[redacted]d who presents to MAU today from MFM for further evaluation of cervical shortening. [place] on 10/09/15 showed cervical length of 2.7 cm. Patient denies contractions, vaginal bleeding, discharge, LOF or recent intercourse. She states increased pelvic pressure recently. [place] today showed dynamic cervical length from 0.1 cm to 1.4 cm.    OB History    Gravida Para Term Preterm AB TAB SAB Ectopic Multiple Living   2 1 1       1       Past Medical History  Diagnosis Date  . Hypertension   . Von Willebrand disease ([place])   . Allergy   . UTI (lower urinary tract infection)     Past Surgical History  Procedure Laterality Date  . Hemmeroid    . Gynecologic cryosurgery    . Cesarean section N/A 08/17/2013    Procedure: Primary Cesarean Section Delivery Baby Boy @ [id number], Apgars 9/10 ;  Surgeon: Lahoma Crocker, MD;  Location: [facility];  Service: Obstetrics;  Laterality: N/A;    Family History  Problem Relation Age of Onset  . Diabetes Maternal Grandmother   . Hypertension Maternal Grandmother   . Cancer Maternal Grandfather   . Emphysema Maternal Grandfather   . Diabetes Paternal Grandmother   . Hypertension Paternal Grandmother   . Cancer Paternal Grandfather     Social History  Substance Use Topics  . Smoking status: Never Smoker   . Smokeless tobacco: Never Used  . Alcohol Use: No    Allergies: No Known Allergies  Prescriptions prior to admission  Medication Sig Dispense Refill Last Dose  . calcium carbonate (TUMS EX) 750 MG chewable tablet Chew 1 tablet by mouth daily as needed for heartburn.   Past Week at Unknown time  . labetalol (NORMODYNE) 200 MG tablet Take 1 tablet (200 mg total) by mouth every 8 (eight) hours.  (Patient taking differently: Take 200 mg by mouth 3 (three) times daily. ) 90 tablet 8 11/23/2015 at 1500  . Prenatal Vit-Fe Fumarate-FA (MULTIVITAMIN-PRENATAL) 27-0.8 MG TABS tablet Take 1 tablet by mouth daily at 12 noon.   11/22/2015 at Unknown time    Review of Systems  Constitutional: Negative for fever and malaise/fatigue.  Gastrointestinal: Positive for abdominal pain.       Neg - contractions  Genitourinary:       Neg - vaginal bleeding, discharge, LOF   Physical Exam   Blood pressure 126/68, pulse 93, resp. rate 18, last menstrual period 05/31/2015, currently breastfeeding.  Physical Exam  Nursing note and vitals reviewed. Constitutional: She is oriented to person, place, and time. She appears well-developed and well-nourished. No distress.  HENT:  Head: Normocephalic and atraumatic.  Cardiovascular: Normal rate.   Respiratory: Effort normal.  GI: Soft. She exhibits no distension and no mass. There is no tenderness. There is no rebound and no guarding.  Neurological: She is alert and oriented to person, place, and time.  Skin: Skin is warm and dry. No erythema.  Psychiatric: She has a normal mood and affect.    Results for orders placed or performed during the hospital encounter of 11/23/15 (from the past 24 hour(s))  Urinalysis, Routine w reflex microscopic (not at [facility])  Status: Abnormal   Collection Time: 11/23/15  3:17 PM  Result Value Ref Range   Color, Urine YELLOW YELLOW   APPearance CLEAR CLEAR   Specific Gravity, Urine 1.010 1.005 - 1.030   pH 6.5 5.0 - 8.0   Glucose, UA NEGATIVE NEGATIVE mg/dL   Hgb urine dipstick TRACE (A) NEGATIVE   Bilirubin Urine NEGATIVE NEGATIVE   Ketones, ur NEGATIVE NEGATIVE mg/dL   Protein, ur NEGATIVE NEGATIVE mg/dL   Nitrite NEGATIVE NEGATIVE   Leukocytes, UA SMALL (A) NEGATIVE  Urine microscopic-add on     Status: Abnormal   Collection Time: 11/23/15  3:17 PM  Result Value Ref Range   Squamous Epithelial / LPF 6-30 (A)  NONE SEEN   WBC, UA 0-5 0 - 5 WBC/hpf   RBC / HPF 0-5 0 - 5 RBC/hpf   Bacteria, UA MANY (A) NONE SEEN  Wet prep, genital     Status: Abnormal   Collection Time: 11/23/15  3:44 PM  Result Value Ref Range   Yeast Wet Prep HPF POC NONE SEEN NONE SEEN   Trich, Wet Prep NONE SEEN NONE SEEN   Clue Cells Wet Prep HPF POC NONE SEEN NONE SEEN   WBC, Wet Prep HPF POC MODERATE (A) NONE SEEN   Sperm NONE SEEN     Fetal Monitoring: Baseline: 140 bpm, moderate variability, 10 x 10 accelerations, no decelerations Contractions: none  MAU Course  Procedures None  MDM Discussed with Dr. Jodi Mourning. Recommends wet prep, FFN, continuous EFM and BMZ today Patient had vaginal probe US performed. Will defer FFN Wet prep collected BMZ ordered and given IM  Discussed patient update with Dr. Jodi Mourning. Admit to Antenatal. Routine orders. Start Mag 4 g loading dose with 2 g/hour  Assessment and Plan  A: SIUP at [redacted]w[redacted]d Shortened cervix  P: Admit to Antenatal for MgSO4 BMZ second dose 11/24/15 at 1600 Regular diet Bedrest with BR privileges  Regular diet NST q shift   Luvenia Redden, PA-C  11/23/2015, 5:21 PM

## 2015-11-23 NOTE — ED Notes (Signed)
Dr. Jodi Mourning notified of pt [place] results.

## 2015-11-23 NOTE — MAU Note (Signed)
Called report to Carleene Overlie RN BS Charge received room assignment of [id number].  Per Dr. Jodi Mourning has cleared to admit patient with NICU

## 2015-11-24 ENCOUNTER — Other Ambulatory Visit: Payer: Self-pay | Admitting: Obstetrics

## 2015-11-24 DIAGNOSIS — O2342 Unspecified infection of urinary tract in pregnancy, second trimester: Secondary | ICD-10-CM

## 2015-11-24 LAB — CBC
HEMATOCRIT: 33.1 % — AB (ref 36.0–46.0)
Hemoglobin: 11.1 g/dL — ABNORMAL LOW (ref 12.0–15.0)
MCH: 30.7 pg (ref 26.0–34.0)
MCHC: 33.5 g/dL (ref 30.0–36.0)
MCV: 91.7 fL (ref 78.0–100.0)
PLATELETS: 163 10*3/uL (ref 150–400)
RBC: 3.61 MIL/uL — ABNORMAL LOW (ref 3.87–5.11)
RDW: 14.4 % (ref 11.5–15.5)
WBC: 13.7 10*3/uL — ABNORMAL HIGH (ref 4.0–10.5)

## 2015-11-24 LAB — FETAL FIBRONECTIN: FETAL FIBRONECTIN: NEGATIVE

## 2015-11-24 MED ORDER — PROGESTERONE MICRONIZED 200 MG PO CAPS
200.0000 mg | ORAL_CAPSULE | Freq: Every day | ORAL | Status: DC
  Administered 2015-11-24 – 2015-11-25 (×2): 200 mg via ORAL
  Filled 2015-11-24 (×3): qty 1

## 2015-11-24 NOTE — Progress Notes (Signed)
Patient ID: Courtney [name], female   DOB: March 28, 1981, 34 y.o.   MRN: [id number] Hospital Day: 2  S: Preterm labor symptoms: pelvic pressure  O: Blood pressure 109/58, pulse 93, temperature 98.1 F (36.7 C), temperature source Oral, resp. rate 20, height 5\' 3"  (1.6 m), weight 166 lb 9.6 oz (75.569 kg), last menstrual period 05/31/2015, currently breastfeeding.   [id number]: 150 bpm Toco: No UC's SVE: Deferred  A/P- 34 y.o. admitted with: Preterm cervical changes on ultrasound, but no UC's.  Magnesium sulfate started for CP prophylaxis and tocolysis.  Continue bedrest.   Present on Admission:  . Cervical shortening affecting pregnancy in third trimester, antepartum  Pregnancy Complications: none  Preterm labor management: IV D5LR started, pelvic rest advised, modified bedrest advised, discontinue work and preterm labor education provided Dating:  [redacted]w[redacted]d PNL Needed:  none FWB:  good PTL:  stable

## 2015-11-25 MED ORDER — MAGNESIUM HYDROXIDE 400 MG/5ML PO SUSP
30.0000 mL | Freq: Two times a day (BID) | ORAL | Status: DC
  Administered 2015-11-25 (×2): 30 mL via ORAL
  Filled 2015-11-25 (×3): qty 30

## 2015-11-25 NOTE — Progress Notes (Signed)
Patient ID: Courtney [name], female   DOB: 1981-11-08, 34 y.o.   MRN: [id number] Hospital Day: 3  S: Preterm labor symptoms: pelvic pressure  O: Blood pressure 108/60, pulse 93, temperature 98.1 F (36.7 C), temperature source Oral, resp. rate 17, height 5\' 3"  (1.6 m), weight 166 lb 9.6 oz (75.569 kg), last menstrual period 05/31/2015, SpO2 94 %, currently breastfeeding.   [id number]: 150 bpm Toco: No UC's SVE:   A/P- 34 y.o. admitted with:  Preterm cervical effacement on routine ultrasound.  Stable on bedrest.  Continue current management.  Present on Admission:  . Cervical shortening affecting pregnancy in third trimester, antepartum  Pregnancy Complications: none  Preterm labor management: IV D5LR started and magnesium sulfate started. Dating:  [redacted]w[redacted]d PNL Needed:  none FWB:  good PTL:  stable

## 2015-11-26 ENCOUNTER — Ambulatory Visit (HOSPITAL_COMMUNITY): Payer: 59

## 2015-11-26 ENCOUNTER — Inpatient Hospital Stay (HOSPITAL_COMMUNITY): Payer: 59

## 2015-11-26 ENCOUNTER — Encounter: Payer: Self-pay | Admitting: *Deleted

## 2015-11-26 MED ORDER — PROGESTERONE MICRONIZED 200 MG PO CAPS: ORAL_CAPSULE | ORAL | Status: DC

## 2015-11-26 NOTE — Consult Note (Signed)
MFM Note  Since admission, she has received a course of BMZ and magnesium sulfate. Fetal fibrnectin was negative.  Ultrasound today: SIUP at 0000000 weeks Cephalic presentation Normal amniotic fluid volume  EV views of cervix: funneling with distal closed portion measuring 1.4 cms   Assessment:  1) SIUP at 25+4 weeks 2) Cervical funneling with improved distal length; continue vaginal progesterone nightly 3) Chronic hypertension on labetalol - continue 4) AMA; low risk NIPS 5) Obesity  Suggested plan:  OK for outpatient management Limited activity Nightly progesterone F/U CL in one week Growth [place] in ~4 weeks Antenatal testing at 32 weeks  Please call with any questions or concerns: [phone]  (Face-to-face consultation with patient: 30 min)

## 2015-11-26 NOTE — Discharge Summary (Signed)
Physician Discharge Summary  Patient ID: Courtney [name] MRN: [id number] DOB/AGE: 36-Jan-1982 36 y.o.  Admit date: 11/23/2015 Discharge date: 11/26/2015  Admission Diagnoses: 25 weeks.  Preterm cervical shortening.  Discharge Diagnoses: Same Active Problems:   Cervical shortening affecting pregnancy in third trimester, antepartum   Discharged Condition: good  Hospital Course: Admitted with cervical shortening on ultrasound with H/O pelvic pressure but no cramping.  Responded well to bedrest and medical therapy.  Discharged home on modified bedrest.  Consults: MFM  Significant Diagnostic Studies: radiology: Ultrasound: 11-23-15 and 11-26-15  Treatments: IV hydration  Discharge Exam: Blood pressure 100/51, pulse 80, temperature 98.2 F (36.8 C), temperature source Oral, resp. rate 20, height 5\' 3"  (1.6 m), weight 166 lb 9.6 oz (75.569 kg), last menstrual period 05/31/2015, SpO2 95 %, currently breastfeeding. General appearance: alert and no distress Resp: clear to auscultation bilaterally Cardio: regular rate and rhythm, S1, S2 normal, no murmur, click, rub or gallop GI: normal findings: soft, non-tender Extremities: extremities normal, atraumatic, no cyanosis or edema  Disposition: 01-Home or Self Care  Discharge Instructions    Discharge activity:    Complete by:  As directed      Discharge diet:  No restrictions    Complete by:  As directed      Discharge instructions    Complete by:  As directed   Preterm Labor, Home Care  Preterm labor is defined as having uterine contractions that cause the cervix to open (dilate), shorten and thin (effacement) before completing 37 weeks of pregnancy. Preterm labor accounts for most hospital admissions in pregnant women.  CAUSES Most cases of preterm labor are unknown.  Small areas of separation of the placenta (abruption).  Excess fluid in the amniotic sac (poly hydramnios).  Twins or more.  The cervix cannot hold the baby  because the tissue in the cervix is too weak (incompetent cervix).  Hormone changes.  Vaginal bleeding in more than one of the trimesters.  Infection of the cervix, vagina or bladder.  Smoking.  Antiphosolipid Syndrome. This happens when antibodies affect the protein in the body.  DIAGNOSIS Factors that help predict preterm labor: History of preterm labor with a past pregnancy.  Bacterial vaginosis in women who previously had preterm labor.  Home uterine activity monitoring that show uterine contractions.  Fetal fibronectin protein that is elevated in women with previous history of preterm labor.  Ultrasound to measure the length of the cervix, if it shows signs of shortening before the due date, it may be a sign of preterm labor.  Using the fibronectin and cervical ultrasound evaluation together is more predictive of impending preterm labor.  Other risk factors include:  Nonwhite race.  Pregnancy in a 36 year old or younger or younger or younger or younger.  Pregnancy in a 67 year old or older.  Low socioeconomic factors.  Low weight gain during the pregnancy.  PREVENTION Not all preterm labor can be prevented. Some early contractions can be prevented with simple measures. Drink fluids. Drink eight, 8 ounce glasses of fluids per day. Preterm labor rates go up in the summer months. Dehydration makes the blood volume decrease. This increases the concentration of oxytocin (hormone that causes uterine contractions) in the blood. Hydrating yourself helps prevent this build up.  Watch for signs of infection. Signs include burning during urination, increased need to urinate, abnormal vaginal discharge or unexplained fevers.  Keep your appointments with your caregiver. Call your caregiver right away if you think you are having uterine contractions.  Seek medical advice with questions  or problems. It is much better to ask questions of your caregiver than to be in untreated preterm labor unknowingly.  MANAGEMENT OF PRETERM LABOR,  IN & [place] There are a lot of things to manage in preterm labor. These things include both medical measures and personal care measures for you and/or your baby. Most preterm labor will be handled in the hospital. Things that may be helpful in preterm labor include: Hydration (oral or IV). Take in eight, 8 ounce glasses of water per day.  Bed rest (home or hospital). Lying on your left side may help.  Avoid intercourse and orgasms.  Medication (antibiotics) to help prevent infection. This is more likely if your membranes have ruptured or if the contractions are caused by infection. Take medications as directed.  Evaluation of your baby. These tests or procedures help the caregiver know how the baby is doing and may do in the case of an early birth. Including:  Biophysical profile.  Non-stress or stress tests.  Amniocentesis to evaluate the baby for fetal lung maturity.  Amniotic fluid volume index (AFI).  An ultrasound.  Medications (steroids) to help your baby's lungs mature more quickly may be used. This may happen if preterm birth cannot be stopped.  Tocolytic medications (medications that help stop uterine contractions) may help prolong the pregnancy up to 7 days. This is helpful if steroids medication is needed to help the baby's lungs mature.  Your caregiver may give other advice on preparation for preterm birth.  Progesterone may be beneficial in some cases of preterm labor.  TREATMENT The best treatment is prevention, being aware of risk factors and early detection. Make sure to ask your caregiver to discuss with you the signs and symptoms of preterm labor, especially if you had preterm labor with a previous pregnancy. HOME CARE INSTRUCTIONS Eat a balanced and nourished diet.  Take your vitamin supplements as directed.  Drink 6 to 8 glasses of liquids a day.  Get plenty of rest and sleep.  Do not have sexual relations if you have preterm labor or are at high risk of having  preterm labor.  Follow your care giver's recommendation regarding activities, medications, blood and other tests (ultrasound, amniocentesis, etc.).  Avoid stress.  Avoid hard labor or prolonged exercise if you are at high risk for preterm labor.  Do not smoke.  SEEK IMMEDIATE MEDICAL CARE IF: You are having contractions.  You have abdominal pain.  You have vaginal bleeding.  You have painful urination.  You have abnormal discharge.  You develop a temperature 102 F (38.9 C) or higher.  Document Released: 12/01/2005 Document Re-Released: 02/25/2010 [facility] Patient Information 2011 [place].     Do not have sex or do anything that might make you have an orgasm    Complete by:  As directed      Notify physician for a general feeling that "something is not right"    Complete by:  As directed      Notify physician for increase or change in vaginal discharge    Complete by:  As directed      Notify physician for intestinal cramps, with or without diarrhea, sometimes described as "gas pain"    Complete by:  As directed      Notify physician for leaking of fluid    Complete by:  As directed      Notify physician for low, dull backache, unrelieved by heat or Tylenol    Complete by:  As  directed      Notify physician for menstrual like cramps    Complete by:  As directed      Notify physician for pelvic pressure    Complete by:  As directed      Notify physician for uterine contractions.  These may be painless and feel like the uterus is tightening or the baby is  "balling up"    Complete by:  As directed      Notify physician for vaginal bleeding    Complete by:  As directed      PRETERM LABOR:  Includes any of the follwing symptoms that occur between 20 - [redacted] weeks gestation.  If these symptoms are not stopped, preterm labor can result in preterm delivery, placing your baby at risk    Complete by:  As directed             Medication List    TAKE these medications         calcium carbonate 750 MG chewable tablet  Commonly known as:  TUMS EX  Chew 1 tablet by mouth daily as needed for heartburn.     labetalol 200 MG tablet  Commonly known as:  NORMODYNE  Take 1 tablet (200 mg total) by mouth every 8 (eight) hours.     multivitamin-prenatal 27-0.8 MG Tabs tablet  Take 1 tablet by mouth daily at 12 noon.     progesterone 200 MG capsule  Commonly known as:  PROMETRIUM  Insert 1 capsule intravaginally at bedtime.           Follow-up Information    Follow up with HARPER,CHARLES A, MD. Schedule an appointment as soon as possible for a visit in 1 week.   Specialty:  Obstetrics and Gynecology   Contact information:   [address] [place] [address] [phone]       Signed: Shelly Bombard 11/26/2015, 3:19 PM

## 2015-11-26 NOTE — Progress Notes (Signed)
Patient ID: Courtney [name], female   DOB: 02/10/1981, 34 y.o.   MRN: [id number] Hospital Day: 4  S: Preterm labor symptoms: pelvic pressure  O: Blood pressure 100/51, pulse 80, temperature 98.2 F (36.8 C), temperature source Oral, resp. rate 20, height 5\' 3"  (1.6 m), weight 166 lb 9.6 oz (75.569 kg), last menstrual period 05/31/2015, SpO2 95 %, currently breastfeeding.   [id number]: 150 bpm Toco: None SVE:   A/P- 34 y.o. admitted with: Pelvic pressure.  Placed on bedrest, magnesium sulfate and progesterone vaginal suppositories.  Seen by MFM today, and recommended outpatient management as above and F/U with MFM on 12-06-15.  Present on Admission:  . Cervical shortening affecting pregnancy in third trimester, antepartum  Pregnancy Complications: preterm labor   Preterm labor management: bedrest advised, pelvic rest advised and modified bedrest advised Dating:  [redacted]w[redacted]d PNL Needed:  none FWB:  good PTL:  stable

## 2015-11-26 NOTE — Progress Notes (Signed)
Ultrasound report reviewed with pt by RN per Dr. Jacelyn Grip request.  Discharge papers reviewed, pt waiting for ride.

## 2015-11-27 ENCOUNTER — Other Ambulatory Visit: Payer: Self-pay | Admitting: Obstetrics

## 2015-11-27 DIAGNOSIS — O3432 Maternal care for cervical incompetence, second trimester: Secondary | ICD-10-CM

## 2015-11-30 ENCOUNTER — Ambulatory Visit (HOSPITAL_COMMUNITY): Payer: 59

## 2015-12-03 ENCOUNTER — Encounter: Payer: Self-pay | Admitting: Obstetrics

## 2015-12-03 ENCOUNTER — Ambulatory Visit (INDEPENDENT_AMBULATORY_CARE_PROVIDER_SITE_OTHER): Payer: 59 | Admitting: Obstetrics

## 2015-12-03 VITALS — BP 108/72 | HR 90 | Wt 196.0 lb

## 2015-12-03 DIAGNOSIS — I10 Essential (primary) hypertension: Secondary | ICD-10-CM

## 2015-12-03 DIAGNOSIS — O09213 Supervision of pregnancy with history of pre-term labor, third trimester: Secondary | ICD-10-CM

## 2015-12-03 LAB — POCT URINALYSIS DIPSTICK
Bilirubin, UA: NEGATIVE
Glucose, UA: NEGATIVE
KETONES UA: NEGATIVE
LEUKOCYTES UA: NEGATIVE
Nitrite, UA: NEGATIVE
PROTEIN UA: NEGATIVE
Spec Grav, UA: 1.015
Urobilinogen, UA: NEGATIVE
pH, UA: 6.5

## 2015-12-03 NOTE — Progress Notes (Signed)
Subjective:    Courtney [name] is a 34 y.o. female being seen today for her obstetrical visit. She is at [redacted]w[redacted]d gestation. Patient reports: no complaints . Fetal movement: normal.  Problem List Items Addressed This Visit    None     Patient Active Problem List   Diagnosis Date Noted  . Cervical shortening affecting pregnancy in third trimester, antepartum 11/23/2015  . [redacted] weeks gestation of pregnancy   . Advanced maternal age in multigravida   . Essential hypertension 03/21/2015  . Depressive disorder, not elsewhere classified 09/06/2013  . Cesarean delivery delivered 08/20/2013  . Excessive fetal growth affecting management of mother, antepartum 07/27/2013  . Rh negative state in antepartum period 05/23/2013  . Normal IUP (intrauterine pregnancy) on prenatal ultrasound 12/16/2012  . Uterine fibroid in pregnancy 12/16/2012  . Vaginal bleeding in pregnancy 12/16/2012  . Subchorionic hemorrhage 12/16/2012   Objective:    BP 108/72 mmHg  Pulse 90  Wt 196 lb (88.905 kg)  LMP 05/31/2015 (Exact Date) FHT: 150 BPM  Uterine Size: size greater than dates     Assessment:    Pregnancy @ [redacted]w[redacted]d    Plan:    OBGCT: ordered for next visit. Signs and symptoms of preterm labor: discussed.  Labs, problem list reviewed and updated 2 hr GTT planned Follow up in 1 weeks.

## 2015-12-06 ENCOUNTER — Ambulatory Visit (HOSPITAL_COMMUNITY)
Admit: 2015-12-06 | Discharge: 2015-12-06 | Disposition: A | Discharge status: Home / Self Care | Payer: 59 | Attending: Obstetrics | Admitting: Obstetrics

## 2015-12-06 ENCOUNTER — Encounter (HOSPITAL_COMMUNITY): Payer: Self-pay

## 2015-12-06 VITALS — BP 101/60 | HR 87 | Wt 194.2 lb

## 2015-12-06 DIAGNOSIS — O3663X0 Maternal care for excessive fetal growth, third trimester, not applicable or unspecified: Secondary | ICD-10-CM

## 2015-12-06 DIAGNOSIS — O3432 Maternal care for cervical incompetence, second trimester: Secondary | ICD-10-CM

## 2015-12-06 DIAGNOSIS — O09522 Supervision of elderly multigravida, second trimester: Secondary | ICD-10-CM | POA: Insufficient documentation

## 2015-12-06 DIAGNOSIS — O99212 Obesity complicating pregnancy, second trimester: Secondary | ICD-10-CM | POA: Diagnosis not present

## 2015-12-06 DIAGNOSIS — O10012 Pre-existing essential hypertension complicating pregnancy, second trimester: Secondary | ICD-10-CM | POA: Diagnosis not present

## 2015-12-06 DIAGNOSIS — Z3A27 27 weeks gestation of pregnancy: Secondary | ICD-10-CM | POA: Diagnosis not present

## 2015-12-11 ENCOUNTER — Other Ambulatory Visit: Payer: 59

## 2015-12-11 DIAGNOSIS — Z3492 Encounter for supervision of normal pregnancy, unspecified, second trimester: Secondary | ICD-10-CM

## 2015-12-11 LAB — CBC
HEMATOCRIT: 37.8 % (ref 36.0–46.0)
Hemoglobin: 12.4 g/dL (ref 12.0–15.0)
MCH: 30.4 pg (ref 26.0–34.0)
MCHC: 32.8 g/dL (ref 30.0–36.0)
MCV: 92.6 fL (ref 78.0–100.0)
MPV: 10.4 fL (ref 8.6–12.4)
PLATELETS: 157 10*3/uL (ref 150–400)
RBC: 4.08 MIL/uL (ref 3.87–5.11)
RDW: 14.2 % (ref 11.5–15.5)
WBC: 9.2 10*3/uL (ref 4.0–10.5)

## 2015-12-12 ENCOUNTER — Telehealth: Payer: Self-pay | Admitting: *Deleted

## 2015-12-12 LAB — GLUCOSE TOLERANCE, 2 HOURS W/ 1HR
GLUCOSE, 2 HOUR: 110 mg/dL (ref 70–139)
GLUCOSE, FASTING: 85 mg/dL (ref 65–99)
GLUCOSE: 95 mg/dL (ref 70–170)

## 2015-12-12 LAB — SYPHILIS: RPR W/REFLEX TO RPR TITER AND TREPONEMAL ANTIBODIES, TRADITIONAL SCREENING AND DIAGNOSIS ALGORITHM

## 2015-12-12 LAB — HIV ANTIBODY (ROUTINE TESTING W REFLEX): HIV 1&2 Ab, 4th Generation: NONREACTIVE

## 2015-12-12 NOTE — Telephone Encounter (Signed)
Patient is calling- she needs verification regarding her present out of work status. She also would like Dr Jodi Mourning to call and let her agent know what modified bedrest is and how that effects her work status. Jacqualine Mau [id number] Dr Jodi Mourning called [employer] to let them know that patient is on rest - up as needed only. She is out of work until delivery due to shortened cervix. Questions answered and a copy of visit 12/19 and her MFM visit will be faxed over for their records. Patient has been notified.

## 2015-12-20 ENCOUNTER — Encounter: Payer: Self-pay | Admitting: Obstetrics

## 2015-12-20 ENCOUNTER — Ambulatory Visit (INDEPENDENT_AMBULATORY_CARE_PROVIDER_SITE_OTHER): Payer: 59 | Admitting: Obstetrics

## 2015-12-20 VITALS — BP 127/69 | HR 107 | Temp 98.1°F | Wt 199.0 lb

## 2015-12-20 DIAGNOSIS — Z3483 Encounter for supervision of other normal pregnancy, third trimester: Secondary | ICD-10-CM

## 2015-12-20 NOTE — Progress Notes (Signed)
Subjective:    Courtney [name] is a 35 y.o. female being seen today for her obstetrical visit. She is at [redacted]w[redacted]d gestation. Patient reports no complaints. Fetal movement: normal.  Problem List Items Addressed This Visit    None    Visit Diagnoses    Supervision of other normal pregnancy, antepartum, third trimester    -  Primary    Relevant Orders    POCT urinalysis dipstick      Patient Active Problem List   Diagnosis Date Noted  . Cervical shortening affecting pregnancy in third trimester, antepartum 11/23/2015  . [redacted] weeks gestation of pregnancy   . Advanced maternal age in multigravida   . Essential hypertension 03/21/2015  . Depressive disorder, not elsewhere classified 09/06/2013  . Cesarean delivery delivered 08/20/2013  . Excessive fetal growth affecting management of mother, antepartum 07/27/2013  . Rh negative state in antepartum period 05/23/2013  . Normal IUP (intrauterine pregnancy) on prenatal ultrasound 12/16/2012  . Uterine fibroid in pregnancy 12/16/2012  . Vaginal bleeding in pregnancy 12/16/2012  . Subchorionic hemorrhage 12/16/2012   Objective:    BP 127/69 mmHg  Pulse 107  Temp(Src) 98.1 F (36.7 C)  Wt 199 lb (90.266 kg)  LMP 05/31/2015 (Exact Date) FHT:  150 BPM  Uterine Size: size greater than dates  Presentation: unsure     Assessment:    Pregnancy @ [redacted]w[redacted]d weeks   Plan:     labs reviewed, problem list updated Consent signed. GBS sent TDAP offered  Rhogam given for RH negative Pediatrician: discussed. Infant feeding: plans to breastfeed. Maternity leave: discussed. Cigarette smoking: never smoked. Orders Placed This Encounter  Procedures  . POCT urinalysis dipstick   No orders of the defined types were placed in this encounter.   Follow up in 2 Weeks.

## 2015-12-21 ENCOUNTER — Ambulatory Visit (HOSPITAL_COMMUNITY)
Admission: RE | Admit: 2015-12-21 | Discharge: 2015-12-21 | Disposition: A | Discharge status: Home / Self Care | Payer: 59 | Source: Ambulatory Visit | Attending: Obstetrics | Admitting: Obstetrics

## 2015-12-21 ENCOUNTER — Encounter (HOSPITAL_COMMUNITY): Payer: Self-pay

## 2015-12-21 ENCOUNTER — Other Ambulatory Visit (HOSPITAL_COMMUNITY): Payer: Self-pay | Admitting: Obstetrics and Gynecology

## 2015-12-21 DIAGNOSIS — O99213 Obesity complicating pregnancy, third trimester: Secondary | ICD-10-CM | POA: Diagnosis not present

## 2015-12-21 DIAGNOSIS — O09523 Supervision of elderly multigravida, third trimester: Secondary | ICD-10-CM

## 2015-12-21 DIAGNOSIS — O10013 Pre-existing essential hypertension complicating pregnancy, third trimester: Secondary | ICD-10-CM | POA: Diagnosis not present

## 2015-12-21 DIAGNOSIS — O10019 Pre-existing essential hypertension complicating pregnancy, unspecified trimester: Secondary | ICD-10-CM

## 2015-12-21 DIAGNOSIS — Z3A29 29 weeks gestation of pregnancy: Secondary | ICD-10-CM | POA: Insufficient documentation

## 2015-12-21 DIAGNOSIS — O3663X0 Maternal care for excessive fetal growth, third trimester, not applicable or unspecified: Secondary | ICD-10-CM

## 2015-12-24 ENCOUNTER — Telehealth: Payer: Self-pay | Admitting: *Deleted

## 2015-12-24 NOTE — Telephone Encounter (Signed)
Patient did not get her Rhogam injection at her last visit- can she wait or does she need to get it before her next appointment? Per Dr Jodi Mourning- bring her in- call to patient- she will come this week.

## 2015-12-27 ENCOUNTER — Ambulatory Visit (INDEPENDENT_AMBULATORY_CARE_PROVIDER_SITE_OTHER): Payer: 59 | Admitting: Obstetrics

## 2015-12-27 VITALS — BP 102/68 | HR 98 | Temp 98.2°F | Wt 197.0 lb

## 2015-12-27 DIAGNOSIS — O360131 Maternal care for anti-D [Rh] antibodies, third trimester, fetus 1: Secondary | ICD-10-CM | POA: Diagnosis not present

## 2015-12-27 DIAGNOSIS — Z3493 Encounter for supervision of normal pregnancy, unspecified, third trimester: Secondary | ICD-10-CM

## 2015-12-27 MED ORDER — RHO D IMMUNE GLOBULIN 1500 UNIT/2ML IJ SOSY: 300.0000 ug | PREFILLED_SYRINGE | Freq: Once | INTRAMUSCULAR | Status: DC

## 2015-12-27 NOTE — Progress Notes (Signed)
For 17 - P only.

## 2015-12-27 NOTE — Progress Notes (Addendum)
Pt is in office today for Rhogam injection.  Pt was given injection, tolerated well.   Lot [id number] Exp 09-28-16  Pt was inquiring about her Von Willebrand status.  Pt states that Dr Burnett Harry had advised pt to ask Dr Jodi Mourning if he would like to retest due to conflicting results per MFM.  Reviewed with Dr Jodi Mourning and test was ordered.    BP 102/68 mmHg  Pulse 98  Temp(Src) 98.2 F (36.8 C)  Wt 197 lb (89.359 kg)  LMP 05/31/2015 (Exact Date)

## 2016-01-03 ENCOUNTER — Ambulatory Visit (INDEPENDENT_AMBULATORY_CARE_PROVIDER_SITE_OTHER): Payer: 59 | Admitting: Certified Nurse Midwife

## 2016-01-03 VITALS — BP 109/69 | HR 94 | Temp 98.4°F | Wt 199.0 lb

## 2016-01-03 DIAGNOSIS — O09213 Supervision of pregnancy with history of pre-term labor, third trimester: Secondary | ICD-10-CM

## 2016-01-04 NOTE — Progress Notes (Signed)
Subjective:    Courtney [name] is a 35 y.o. female being seen today for her obstetrical visit. She is at [redacted]w[redacted]d gestation. Patient reports no complaints. Denies any contractions currently.  Is wearing maternity support belt.  Fetal movement: normal.  Problem List Items Addressed This Visit    None     Patient Active Problem List   Diagnosis Date Noted  . Cervical shortening affecting pregnancy in third trimester, antepartum 11/23/2015  . [redacted] weeks gestation of pregnancy   . Advanced maternal age in multigravida   . Essential hypertension 03/21/2015  . Depressive disorder, not elsewhere classified 09/06/2013  . Cesarean delivery delivered 08/20/2013  . Excessive fetal growth affecting management of mother, antepartum 07/27/2013  . Rh negative state in antepartum period 05/23/2013  . Normal IUP (intrauterine pregnancy) on prenatal ultrasound 12/16/2012  . Uterine fibroid in pregnancy 12/16/2012  . Vaginal bleeding in pregnancy 12/16/2012  . Subchorionic hemorrhage 12/16/2012   Objective:    BP 109/69 mmHg  Pulse 94  Temp(Src) 98.4 F (36.9 C)  Wt 199 lb (90.266 kg)  LMP 05/31/2015 (Exact Date) FHT:  145 BPM  Uterine Size: size equals dates  Presentation: cephalic     Assessment:    Pregnancy @ [redacted]w[redacted]d weeks   Doing well.  Repeat C-section planned, tubal papers signed  Plan:     labs reviewed, problem list updated Consent signed. GBS planning TDAP offered  Rhogam given for RH negative Pediatrician: discussed. Infant feeding: plans to breastfeed. Maternity leave: discussed. Cigarette smoking: never smoked. No orders of the defined types were placed in this encounter.   No orders of the defined types were placed in this encounter.   Follow up in 2 Weeks.

## 2016-01-07 LAB — VON WILLEBRAND FACTOR MULTIMER
Factor-VIII Activity: 45 % — ABNORMAL LOW (ref 50–180)
RISTOCETIN CO-FACTOR: 32 % — AB (ref 42–200)
Von Willebrand Factor Ag: 47 % — ABNORMAL LOW (ref 50–217)

## 2016-01-08 ENCOUNTER — Ambulatory Visit (INDEPENDENT_AMBULATORY_CARE_PROVIDER_SITE_OTHER): Payer: 59 | Admitting: Certified Nurse Midwife

## 2016-01-08 ENCOUNTER — Inpatient Hospital Stay (HOSPITAL_COMMUNITY)
Admission: AD | Admit: 2016-01-08 | Discharge: 2016-01-08 | Disposition: A | Discharge status: Home / Self Care | Payer: 59 | Source: Ambulatory Visit | Attending: Obstetrics | Admitting: Obstetrics

## 2016-01-08 ENCOUNTER — Encounter (HOSPITAL_COMMUNITY): Payer: Self-pay | Admitting: *Deleted

## 2016-01-08 VITALS — BP 114/74 | HR 96 | Temp 98.5°F | Wt 201.6 lb

## 2016-01-08 DIAGNOSIS — O10913 Unspecified pre-existing hypertension complicating pregnancy, third trimester: Secondary | ICD-10-CM | POA: Diagnosis not present

## 2016-01-08 DIAGNOSIS — Z3483 Encounter for supervision of other normal pregnancy, third trimester: Secondary | ICD-10-CM

## 2016-01-08 DIAGNOSIS — O09213 Supervision of pregnancy with history of pre-term labor, third trimester: Secondary | ICD-10-CM | POA: Diagnosis not present

## 2016-01-08 DIAGNOSIS — Z3A31 31 weeks gestation of pregnancy: Secondary | ICD-10-CM | POA: Diagnosis not present

## 2016-01-08 DIAGNOSIS — Z3689 Encounter for other specified antenatal screening: Secondary | ICD-10-CM

## 2016-01-08 LAB — POCT URINALYSIS DIPSTICK
Bilirubin, UA: NEGATIVE
GLUCOSE UA: NEGATIVE
Ketones, UA: NEGATIVE
Leukocytes, UA: NEGATIVE
NITRITE UA: NEGATIVE
Protein, UA: NEGATIVE
RBC UA: NEGATIVE
Spec Grav, UA: 1.015
UROBILINOGEN UA: NEGATIVE
pH, UA: 5.5

## 2016-01-08 NOTE — Discharge Instructions (Signed)
Fetal Movement Counts  Patient Name: __________________________________________________ Patient Due Date: ____________________  Performing a fetal movement count is highly recommended in high-risk pregnancies, but it is good for every pregnant woman to do. Your health care provider may ask you to start counting fetal movements at 28 weeks of the pregnancy. Fetal movements often increase:  · After eating a full meal.  · After physical activity.  · After eating or drinking something sweet or cold.  · At rest.  Pay attention to when you feel the baby is most active. This will help you notice a pattern of your baby's sleep and wake cycles and what factors contribute to an increase in fetal movement. It is important to perform a fetal movement count at the same time each day when your baby is normally most active.   HOW TO COUNT FETAL MOVEMENTS  1. Find a quiet and comfortable area to sit or lie down on your left side. Lying on your left side provides the best blood and oxygen circulation to your baby.  2. Write down the day and time on a sheet of paper or in a journal.  3. Start counting kicks, flutters, swishes, rolls, or jabs in a 2-hour period. You should feel at least 10 movements within 2 hours.  4. If you do not feel 10 movements in 2 hours, wait 2-3 hours and count again. Look for a change in the pattern or not enough counts in 2 hours.  SEEK MEDICAL CARE IF:  · You feel less than 10 counts in 2 hours, tried twice.  · There is no movement in over an hour.  · The pattern is changing or taking longer each day to reach 10 counts in 2 hours.  · You feel the baby is not moving as he or she usually does.  Date: ____________ Movements: ____________ Start time: ____________ Finish time: ____________   Date: ____________ Movements: ____________ Start time: ____________ Finish time: ____________  Date: ____________ Movements: ____________ Start time: ____________ Finish time: ____________  Date: ____________ Movements:  ____________ Start time: ____________ Finish time: ____________  Date: ____________ Movements: ____________ Start time: ____________ Finish time: ____________  Date: ____________ Movements: ____________ Start time: ____________ Finish time: ____________  Date: ____________ Movements: ____________ Start time: ____________ Finish time: ____________  Date: ____________ Movements: ____________ Start time: ____________ Finish time: ____________   Date: ____________ Movements: ____________ Start time: ____________ Finish time: ____________  Date: ____________ Movements: ____________ Start time: ____________ Finish time: ____________  Date: ____________ Movements: ____________ Start time: ____________ Finish time: ____________  Date: ____________ Movements: ____________ Start time: ____________ Finish time: ____________  Date: ____________ Movements: ____________ Start time: ____________ Finish time: ____________  Date: ____________ Movements: ____________ Start time: ____________ Finish time: ____________  Date: ____________ Movements: ____________ Start time: ____________ Finish time: ____________   Date: ____________ Movements: ____________ Start time: ____________ Finish time: ____________  Date: ____________ Movements: ____________ Start time: ____________ Finish time: ____________  Date: ____________ Movements: ____________ Start time: ____________ Finish time: ____________  Date: ____________ Movements: ____________ Start time: ____________ Finish time: ____________  Date: ____________ Movements: ____________ Start time: ____________ Finish time: ____________  Date: ____________ Movements: ____________ Start time: ____________ Finish time: ____________  Date: ____________ Movements: ____________ Start time: ____________ Finish time: ____________   Date: ____________ Movements: ____________ Start time: ____________ Finish time: ____________  Date: ____________ Movements: ____________ Start time: ____________ Finish  time: ____________  Date: ____________ Movements: ____________ Start time: ____________ Finish time: ____________  Date: ____________ Movements: ____________ Start time:   ____________ Finish time: ____________  Date: ____________ Movements: ____________ Start time: ____________ Finish time: ____________  Date: ____________ Movements: ____________ Start time: ____________ Finish time: ____________  Date: ____________ Movements: ____________ Start time: ____________ Finish time: ____________   Date: ____________ Movements: ____________ Start time: ____________ Finish time: ____________  Date: ____________ Movements: ____________ Start time: ____________ Finish time: ____________  Date: ____________ Movements: ____________ Start time: ____________ Finish time: ____________  Date: ____________ Movements: ____________ Start time: ____________ Finish time: ____________  Date: ____________ Movements: ____________ Start time: ____________ Finish time: ____________  Date: ____________ Movements: ____________ Start time: ____________ Finish time: ____________  Date: ____________ Movements: ____________ Start time: ____________ Finish time: ____________   Date: ____________ Movements: ____________ Start time: ____________ Finish time: ____________  Date: ____________ Movements: ____________ Start time: ____________ Finish time: ____________  Date: ____________ Movements: ____________ Start time: ____________ Finish time: ____________  Date: ____________ Movements: ____________ Start time: ____________ Finish time: ____________  Date: ____________ Movements: ____________ Start time: ____________ Finish time: ____________  Date: ____________ Movements: ____________ Start time: ____________ Finish time: ____________  Date: ____________ Movements: ____________ Start time: ____________ Finish time: ____________   Date: ____________ Movements: ____________ Start time: ____________ Finish time: ____________  Date: ____________  Movements: ____________ Start time: ____________ Finish time: ____________  Date: ____________ Movements: ____________ Start time: ____________ Finish time: ____________  Date: ____________ Movements: ____________ Start time: ____________ Finish time: ____________  Date: ____________ Movements: ____________ Start time: ____________ Finish time: ____________  Date: ____________ Movements: ____________ Start time: ____________ Finish time: ____________  Date: ____________ Movements: ____________ Start time: ____________ Finish time: ____________   Date: ____________ Movements: ____________ Start time: ____________ Finish time: ____________  Date: ____________ Movements: ____________ Start time: ____________ Finish time: ____________  Date: ____________ Movements: ____________ Start time: ____________ Finish time: ____________  Date: ____________ Movements: ____________ Start time: ____________ Finish time: ____________  Date: ____________ Movements: ____________ Start time: ____________ Finish time: ____________  Date: ____________ Movements: ____________ Start time: ____________ Finish time: ____________     This information is not intended to replace advice given to you by your health care provider. Make sure you discuss any questions you have with your health care provider.     Document Released: 12/31/2006 Document Revised: 12/22/2014 Document Reviewed: 09/27/2012  [employer] Interactive Patient Education ©2016 [employer]

## 2016-01-08 NOTE — Progress Notes (Signed)
Subjective:    Courtney [name] is a 35 y.o. female being seen today for her obstetrical visit. She is at [redacted]w[redacted]d gestation. Patient reports no complaints. Fetal movement: normal.  Problem List Items Addressed This Visit    None    Visit Diagnoses    Encounter for supervision of other normal pregnancy in third trimester    -  Primary    Relevant Orders    POCT urinalysis dipstick (Completed)      Patient Active Problem List   Diagnosis Date Noted  . Cervical shortening affecting pregnancy in third trimester, antepartum 11/23/2015  . [redacted] weeks gestation of pregnancy   . Advanced maternal age in multigravida   . Essential hypertension 03/21/2015  . Depressive disorder, not elsewhere classified 09/06/2013  . Cesarean delivery delivered 08/20/2013  . Excessive fetal growth affecting management of mother, antepartum 07/27/2013  . Rh negative state in antepartum period 05/23/2013  . Normal IUP (intrauterine pregnancy) on prenatal ultrasound 12/16/2012  . Uterine fibroid in pregnancy 12/16/2012  . Vaginal bleeding in pregnancy 12/16/2012  . Subchorionic hemorrhage 12/16/2012   Objective:    BP 114/74 mmHg  Pulse 96  Temp(Src) 98.5 F (36.9 C)  Wt 201 lb 9.6 oz (91.445 kg)  LMP 05/31/2015 (Exact Date) FHT:  135 BPM  Uterine Size: size equals dates  Presentation: unsure   NST: non-reactive, no accels, limited variability.  Sent to MAU for evaluation.    Assessment:    Pregnancy @ [redacted]w[redacted]d weeks   Non-reactive NST  Hx of hypertension, currently pregnant  Desires repeat C-section with BTL  RH-: rhogam was given previously 12/27/15  AMA  Plan:     labs reviewed, problem list updated Consent signed. GBS planning TDAP offered  Rhogam given for RH negative Pediatrician: discussed. Infant feeding: plans to breastfeed. Maternity leave: discussed. Cigarette smoking: never smoked. Orders Placed This Encounter  Procedures  . POCT urinalysis dipstick   No orders of the  defined types were placed in this encounter.   Follow up in 1 Week with NST.

## 2016-01-08 NOTE — MAU Note (Signed)
Notified provider that patient strip is reactive. Provider said to discharge patient.

## 2016-01-08 NOTE — MAU Provider Note (Signed)
Chief Complaint:  Non-stress Test   First Provider Initiated Contact with Patient 01/08/16 1113      HPI: Courtney [name] is a 35 y.o. G2P1001 at [redacted]w[redacted]d who presents to maternity admissions sent from the office for nonreactive NST. She has chronic HTN in pregnancy and has scheduled NSTs and ultrasounds.  She reports her baby is less active in the mornings and moves around more in the afternoon/evening so her next NST in the office is scheduled in the afternoon.   She reports good fetal movement, denies cramping/contractions, LOF, vaginal bleeding, vaginal itching/burning, urinary symptoms, h/a, dizziness, n/v, or fever/chills.    HPI  Past Medical History: Past Medical History  Diagnosis Date  . Hypertension   . Von Willebrand disease ([place])   . Allergy   . UTI (lower urinary tract infection)     Past obstetric history: OB History  Gravida Para Term Preterm AB SAB TAB Ectopic Multiple Living  2 1 1       1     # Outcome Date GA Lbr Len/2nd Weight Sex Delivery Anes PTL Lv  2 Current           1 Term 08/17/13 [redacted]w[redacted]d 21:31 / 04:17 8 lb 4.6 oz (3.759 kg) M CS-LTranv Gen  Y      Past Surgical History: Past Surgical History  Procedure Laterality Date  . Hemmeroid    . Gynecologic cryosurgery    . Cesarean section N/A 08/17/2013    Procedure: Primary Cesarean Section Delivery Baby Boy @ [id number], Apgars 9/10 ;  Surgeon: Lahoma Crocker, MD;  Location: [facility];  Service: Obstetrics;  Laterality: N/A;    Family History: Family History  Problem Relation Age of Onset  . Diabetes Maternal Grandmother   . Hypertension Maternal Grandmother   . Cancer Maternal Grandfather   . Emphysema Maternal Grandfather   . Diabetes Paternal Grandmother   . Hypertension Paternal Grandmother   . Cancer Paternal Grandfather     Social History: Social History  Substance Use Topics  . Smoking status: Never Smoker   . Smokeless tobacco: Never Used  . Alcohol Use: No    Allergies: No Known  Allergies  Meds:  Facility-administered medications prior to admission  Medication Dose Route Frequency Provider Last Rate Last Dose  . rho (d) immune globulin (RHIG/RHOPHYLAC) injection 300 mcg  300 mcg Intramuscular Once Shelly Bombard, MD       Prescriptions prior to admission  Medication Sig Dispense Refill Last Dose  . calcium carbonate (TUMS EX) 750 MG chewable tablet Chew 1 tablet by mouth daily as needed for heartburn. Reported on 12/20/2015   Taking  . labetalol (NORMODYNE) 200 MG tablet Take 1 tablet (200 mg total) by mouth every 8 (eight) hours. (Patient taking differently: Take 200 mg by mouth 3 (three) times daily. ) 90 tablet 8 Taking  . Prenatal Vit-Fe Fumarate-FA (MULTIVITAMIN-PRENATAL) 27-0.8 MG TABS tablet Take 1 tablet by mouth daily at 12 noon.   Taking  . progesterone (PROMETRIUM) 200 MG capsule Insert 1 capsule intravaginally at bedtime. 30 capsule 5 Taking    ROS:  Review of Systems  Constitutional: Negative for fever, chills and fatigue.  Eyes: Negative for visual disturbance.  Respiratory: Negative for shortness of breath.   Cardiovascular: Negative for chest pain.  Gastrointestinal: Negative for nausea, vomiting and abdominal pain.  Genitourinary: Negative for dysuria, flank pain, vaginal bleeding, vaginal discharge, difficulty urinating, vaginal pain and pelvic pain.  Neurological: Negative for dizziness and headaches.  Psychiatric/Behavioral: Negative.  I have reviewed patient's Past Medical Hx, Surgical Hx, Family Hx, Social Hx, medications and allergies.   Physical Exam   Patient Vitals for the past 24 hrs:  BP Temp Pulse Resp  01/08/16 1059 125/63 mmHg 98.5 F (36.9 C) 112 18   Constitutional: Well-developed, well-nourished female in no acute distress.  Cardiovascular: normal rate Respiratory: normal effort GI: Abd soft, non-tender, gravid appropriate for gestational age.  MS: Extremities nontender, no edema, normal ROM Neurologic: Alert  and oriented x 4.  GU: Neg CVAT.   FHT:  Baseline 140 , moderate variability, accelerations present, no decelerations Contractions: None on toco or to palpation    Labs: Results for orders placed or performed in visit on 01/08/16 (from the past 24 hour(s))  POCT urinalysis dipstick     Status: None   Collection Time: 01/08/16  9:39 AM  Result Value Ref Range   Color, UA yellow    Clarity, UA clear    Glucose, UA negative    Bilirubin, UA negativde    Ketones, UA negative    Spec Grav, UA 1.015    Blood, UA negative    pH, UA 5.5    Protein, UA negative    Urobilinogen, UA negative    Nitrite, UA negative    Leukocytes, UA Negative Negative   --/--/A NEG (12/09 1800)    MAU Course/MDM: I have performed a medical screen on this patient and she is stable and ready for discharge.  I reviewed FHR tracing which is reactive with 10x10 accels at [redacted]w[redacted]d.    Assessment: 1. NST (non-stress test) reactive     Plan: RN to call Dr Jodi Mourning with NST results     Medication List    ASK your doctor about these medications        calcium carbonate 750 MG chewable tablet  Commonly known as:  TUMS EX  Chew 1 tablet by mouth daily as needed for heartburn. Reported on 12/20/2015     labetalol 200 MG tablet  Commonly known as:  NORMODYNE  Take 1 tablet (200 mg total) by mouth every 8 (eight) hours.     multivitamin-prenatal 27-0.8 MG Tabs tablet  Take 1 tablet by mouth daily at 12 noon.     progesterone 200 MG capsule  Commonly known as:  PROMETRIUM  Insert 1 capsule intravaginally at bedtime.        Fatima Blank Certified Nurse-Midwife 01/08/2016 11:23 AM

## 2016-01-08 NOTE — MAU Note (Signed)
Pt presents to MAU for monitoring for non reactive NST from office this morning Denies any pain. Reports baby is active

## 2016-01-11 ENCOUNTER — Encounter (HOSPITAL_COMMUNITY): Payer: Self-pay

## 2016-01-11 ENCOUNTER — Ambulatory Visit (HOSPITAL_COMMUNITY)
Admission: RE | Admit: 2016-01-11 | Discharge: 2016-01-11 | Disposition: A | Discharge status: Home / Self Care | Payer: 59 | Source: Ambulatory Visit | Attending: Obstetrics | Admitting: Obstetrics

## 2016-01-11 ENCOUNTER — Other Ambulatory Visit (HOSPITAL_COMMUNITY): Payer: Self-pay | Admitting: Maternal and Fetal Medicine

## 2016-01-11 DIAGNOSIS — Z3A32 32 weeks gestation of pregnancy: Secondary | ICD-10-CM | POA: Diagnosis not present

## 2016-01-11 DIAGNOSIS — O09523 Supervision of elderly multigravida, third trimester: Secondary | ICD-10-CM | POA: Insufficient documentation

## 2016-01-11 DIAGNOSIS — O10019 Pre-existing essential hypertension complicating pregnancy, unspecified trimester: Secondary | ICD-10-CM

## 2016-01-11 DIAGNOSIS — O99213 Obesity complicating pregnancy, third trimester: Secondary | ICD-10-CM | POA: Insufficient documentation

## 2016-01-11 DIAGNOSIS — O10013 Pre-existing essential hypertension complicating pregnancy, third trimester: Secondary | ICD-10-CM | POA: Insufficient documentation

## 2016-01-16 ENCOUNTER — Ambulatory Visit (INDEPENDENT_AMBULATORY_CARE_PROVIDER_SITE_OTHER): Payer: 59 | Admitting: Certified Nurse Midwife

## 2016-01-16 VITALS — BP 115/67 | HR 91 | Temp 98.4°F | Wt 207.0 lb

## 2016-01-16 DIAGNOSIS — O09213 Supervision of pregnancy with history of pre-term labor, third trimester: Secondary | ICD-10-CM | POA: Diagnosis not present

## 2016-01-16 DIAGNOSIS — O0993 Supervision of high risk pregnancy, unspecified, third trimester: Secondary | ICD-10-CM

## 2016-01-16 NOTE — Progress Notes (Signed)
Subjective:    Courtney [name] is a 35 y.o. female being seen today for her obstetrical visit. She is at [redacted]w[redacted]d gestation. Patient reports no complaints. Fetal movement: normal.  Problem List Items Addressed This Visit    None     Patient Active Problem List   Diagnosis Date Noted  . Cervical shortening affecting pregnancy in third trimester, antepartum 11/23/2015  . Advanced maternal age in multigravida   . Essential hypertension 03/21/2015  . Depressive disorder, not elsewhere classified 09/06/2013  . Cesarean delivery delivered 08/20/2013  . Excessive fetal growth affecting management of mother, antepartum 07/27/2013  . Rh negative state in antepartum period 05/23/2013  . Normal IUP (intrauterine pregnancy) on prenatal ultrasound 12/16/2012  . Uterine fibroid in pregnancy 12/16/2012  . Vaginal bleeding in pregnancy 12/16/2012  . Subchorionic hemorrhage 12/16/2012   Objective:    BP 115/67 mmHg  Pulse 91  Temp(Src) 98.4 F (36.9 C)  Wt 207 lb (93.895 kg)  LMP 05/31/2015 (Exact Date) FHT:  140 BPM  Uterine Size: size equals dates  Presentation: cephalic   NST: +accels, no decels, moderate variability, Cat. 1 tracing, no contractions noted.   Assessment:    Pregnancy @ [redacted]w[redacted]d weeks   HTN  AMA  Reactive NST Plan:     labs reviewed, problem list updated Consent signed. GBS planning TDAP offered  Rhogam given for RH negative Pediatrician: discussed. Infant feeding: plans to breastfeed. Maternity leave: discussed. Cigarette smoking: never smoked. No orders of the defined types were placed in this encounter.   No orders of the defined types were placed in this encounter.   Follow up in 1 Week with NST.

## 2016-01-17 ENCOUNTER — Other Ambulatory Visit (HOSPITAL_COMMUNITY): Payer: Self-pay | Admitting: Maternal and Fetal Medicine

## 2016-01-17 DIAGNOSIS — O99213 Obesity complicating pregnancy, third trimester: Secondary | ICD-10-CM

## 2016-01-17 DIAGNOSIS — O09523 Supervision of elderly multigravida, third trimester: Secondary | ICD-10-CM

## 2016-01-17 DIAGNOSIS — O10019 Pre-existing essential hypertension complicating pregnancy, unspecified trimester: Secondary | ICD-10-CM

## 2016-01-17 DIAGNOSIS — Z3A33 33 weeks gestation of pregnancy: Secondary | ICD-10-CM

## 2016-01-18 ENCOUNTER — Ambulatory Visit (HOSPITAL_COMMUNITY)
Admission: RE | Admit: 2016-01-18 | Discharge: 2016-01-18 | Disposition: A | Discharge status: Home / Self Care | Payer: 59 | Source: Ambulatory Visit | Attending: Obstetrics | Admitting: Obstetrics

## 2016-01-18 ENCOUNTER — Encounter (HOSPITAL_COMMUNITY): Payer: Self-pay

## 2016-01-18 VITALS — BP 122/56 | HR 93 | Wt 209.8 lb

## 2016-01-18 DIAGNOSIS — Z3A33 33 weeks gestation of pregnancy: Secondary | ICD-10-CM | POA: Insufficient documentation

## 2016-01-18 DIAGNOSIS — O09523 Supervision of elderly multigravida, third trimester: Secondary | ICD-10-CM | POA: Diagnosis not present

## 2016-01-18 DIAGNOSIS — O99213 Obesity complicating pregnancy, third trimester: Secondary | ICD-10-CM | POA: Insufficient documentation

## 2016-01-18 DIAGNOSIS — O283 Abnormal ultrasonic finding on antenatal screening of mother: Secondary | ICD-10-CM | POA: Diagnosis not present

## 2016-01-18 DIAGNOSIS — O10013 Pre-existing essential hypertension complicating pregnancy, third trimester: Secondary | ICD-10-CM | POA: Insufficient documentation

## 2016-01-18 DIAGNOSIS — O10019 Pre-existing essential hypertension complicating pregnancy, unspecified trimester: Secondary | ICD-10-CM

## 2016-01-22 ENCOUNTER — Ambulatory Visit (INDEPENDENT_AMBULATORY_CARE_PROVIDER_SITE_OTHER): Payer: 59 | Admitting: Certified Nurse Midwife

## 2016-01-22 VITALS — BP 122/74 | HR 91 | Temp 98.6°F | Wt 206.0 lb

## 2016-01-22 DIAGNOSIS — D6800 Von Willebrand disease, unspecified: Secondary | ICD-10-CM

## 2016-01-22 DIAGNOSIS — O09219 Supervision of pregnancy with history of pre-term labor, unspecified trimester: Secondary | ICD-10-CM | POA: Diagnosis not present

## 2016-01-22 DIAGNOSIS — O0993 Supervision of high risk pregnancy, unspecified, third trimester: Secondary | ICD-10-CM

## 2016-01-22 LAB — POCT URINALYSIS DIPSTICK
Bilirubin, UA: NEGATIVE
Blood, UA: NEGATIVE
Glucose, UA: NEGATIVE
KETONES UA: NEGATIVE
LEUKOCYTES UA: NEGATIVE
NITRITE UA: NEGATIVE
PROTEIN UA: NEGATIVE
Spec Grav, UA: 1.02
Urobilinogen, UA: NEGATIVE
pH, UA: 6

## 2016-01-22 NOTE — Progress Notes (Signed)
Subjective:    Courtney [name] is a 35 y.o. female being seen today for her obstetrical visit. She is at [redacted]w[redacted]d gestation. Patient reports backache, no bleeding, no contractions, no cramping and no leaking. Fetal movement: normal.  Problem List Items Addressed This Visit    None    Visit Diagnoses    Supervision of pregnancy with history of pre-term labor, antepartum, unspecified trimester    -  Primary    Relevant Orders    POCT urinalysis dipstick (Completed)    Supervision of high risk pregnancy in third trimester        Relevant Orders    Consult to Maternal Fetal Care    Consult to anesthesiology    Von Willebrand disease [facility])        Relevant Orders    Consult to Maternal Fetal Care    Consult to anesthesiology      Patient Active Problem List   Diagnosis Date Noted  . Cervical shortening affecting pregnancy in third trimester, antepartum 11/23/2015  . Advanced maternal age in multigravida   . Essential hypertension 03/21/2015  . Depressive disorder, not elsewhere classified 09/06/2013  . Cesarean delivery delivered 08/20/2013  . Excessive fetal growth affecting management of mother, antepartum 07/27/2013  . Rh negative state in antepartum period 05/23/2013  . Normal IUP (intrauterine pregnancy) on prenatal ultrasound 12/16/2012  . Uterine fibroid in pregnancy 12/16/2012  . Vaginal bleeding in pregnancy 12/16/2012  . Subchorionic hemorrhage 12/16/2012   Objective:    BP 122/74 mmHg  Pulse 91  Temp(Src) 98.6 F (37 C)  Wt 206 lb (93.441 kg)  LMP 05/31/2015 (Exact Date) FHT:  140 BPM  Uterine Size: size equals dates  Presentation: cephalic   NST; + accels, no decels, Cat. 1 tracing, no contractions, moderate variablity  Assessment:    Pregnancy @ [redacted]w[redacted]d weeks   H/O Von Willebrand's disease  Reactive NST Plan:   C/S scheduled with BTL   labs reviewed, problem list updated Consent signed. GBS planning TDAP offered  Rhogam given for RH  negative Pediatrician: discussed. Infant feeding: plans to breastfeed. Maternity leave: discussed. Cigarette smoking: never smoked. Orders Placed This Encounter  Procedures  . Consult to Maternal Fetal Care    Hx of Von Wilebrand's, need POC for delivery for repeat c/section and BTL.  . Consult to anesthesiology    Hx of Von Wilebrand's  . POCT urinalysis dipstick   No orders of the defined types were placed in this encounter.   Follow up in 1 Week with NST.

## 2016-01-22 NOTE — Progress Notes (Signed)
Pt is having increase in nosebleeds.

## 2016-01-23 ENCOUNTER — Encounter: Payer: 59 | Admitting: Certified Nurse Midwife

## 2016-01-23 ENCOUNTER — Other Ambulatory Visit: Payer: Self-pay | Admitting: *Deleted

## 2016-01-25 ENCOUNTER — Ambulatory Visit (HOSPITAL_COMMUNITY)
Admission: RE | Admit: 2016-01-25 | Discharge: 2016-01-25 | Disposition: A | Discharge status: Home / Self Care | Payer: 59 | Source: Ambulatory Visit | Attending: Obstetrics | Admitting: Obstetrics

## 2016-01-25 ENCOUNTER — Ambulatory Visit (HOSPITAL_COMMUNITY)
Admission: RE | Admit: 2016-01-25 | Discharge: 2016-01-25 | Disposition: A | Discharge status: Home / Self Care | Payer: 59 | Source: Ambulatory Visit | Attending: Certified Nurse Midwife | Admitting: Certified Nurse Midwife

## 2016-01-25 ENCOUNTER — Encounter (HOSPITAL_COMMUNITY): Payer: Self-pay

## 2016-01-25 ENCOUNTER — Other Ambulatory Visit (HOSPITAL_COMMUNITY): Payer: Self-pay | Admitting: Maternal and Fetal Medicine

## 2016-01-25 ENCOUNTER — Ambulatory Visit (HOSPITAL_COMMUNITY): Payer: 59

## 2016-01-25 DIAGNOSIS — O34219 Maternal care for unspecified type scar from previous cesarean delivery: Secondary | ICD-10-CM

## 2016-01-25 DIAGNOSIS — O99213 Obesity complicating pregnancy, third trimester: Secondary | ICD-10-CM

## 2016-01-25 DIAGNOSIS — Z3A34 34 weeks gestation of pregnancy: Secondary | ICD-10-CM | POA: Diagnosis not present

## 2016-01-25 DIAGNOSIS — O10013 Pre-existing essential hypertension complicating pregnancy, third trimester: Secondary | ICD-10-CM

## 2016-01-25 DIAGNOSIS — D259 Leiomyoma of uterus, unspecified: Secondary | ICD-10-CM

## 2016-01-25 DIAGNOSIS — O3413 Maternal care for benign tumor of corpus uteri, third trimester: Secondary | ICD-10-CM

## 2016-01-25 DIAGNOSIS — O360131 Maternal care for anti-D [Rh] antibodies, third trimester, fetus 1: Secondary | ICD-10-CM

## 2016-01-25 DIAGNOSIS — I10 Essential (primary) hypertension: Secondary | ICD-10-CM | POA: Diagnosis not present

## 2016-01-25 DIAGNOSIS — O113 Pre-existing hypertension with pre-eclampsia, third trimester: Secondary | ICD-10-CM | POA: Insufficient documentation

## 2016-01-25 DIAGNOSIS — O09523 Supervision of elderly multigravida, third trimester: Secondary | ICD-10-CM | POA: Insufficient documentation

## 2016-01-25 DIAGNOSIS — E669 Obesity, unspecified: Secondary | ICD-10-CM | POA: Insufficient documentation

## 2016-01-25 DIAGNOSIS — O09293 Supervision of pregnancy with other poor reproductive or obstetric history, third trimester: Secondary | ICD-10-CM

## 2016-01-25 DIAGNOSIS — O2693 Pregnancy related conditions, unspecified, third trimester: Secondary | ICD-10-CM | POA: Insufficient documentation

## 2016-01-25 DIAGNOSIS — O10019 Pre-existing essential hypertension complicating pregnancy, unspecified trimester: Secondary | ICD-10-CM

## 2016-01-25 DIAGNOSIS — O3410 Maternal care for benign tumor of corpus uteri, unspecified trimester: Secondary | ICD-10-CM | POA: Insufficient documentation

## 2016-01-25 NOTE — Consult Note (Signed)
Maternal Fetal Medicine Consultation  Requesting Provider(s): Baltazar Najjar, MD  Reason for consultation: Possible von Willebrand's disease  HPI: Courtney [name] is a 35 yo G2P1001,  EDD 03/06/2016 who is currently at [redacted]w[redacted]d seen for consultation for possible von Willebrand's disease.  Courtney [name] has been followed by the MFM service for serial ultrasounds and antenatal testing due to [facility].  She is currently on Labetalol 200 mg q 8hrs and blood pressures have overall been well-controlled.  She is tentatively scheduled for repeat C-section and BTL at 39 weeks (17 March)  Courtney [name] reports a history of menorrhagia as a teen ager.  Years ago, Dr. Jodi Mourning performed lab work to [employer] disease that was apparently suggestive of Von Willebrand's disease, but no formal Hematology evaluation was ever completed.  The patient was started on OCPs and did not have any additional menorrhagia.  Due to concerns of possible von Willebrand's disease in 2014, the patient underwent general anesthesia for a C-section due to arrest of dilation.  Lab work done following the procedure showed factor VIII activity of 182%; von Willebrand antigen activity of 144% and Ristocetin co factor of 80%.  The patient did not experience any post partum hemorrhage or abnormal bleeding.   Repeat lab work was completed in mid January - Factor VIII activity was 45%; von Willeband antigen was 47% and Ristocetin co factor was 32%.  The patient reports that all her sisters and mother experienced menorrhagia.  She reports a history of heavy oral bleeding following teeth cleaning that required sutures - she denies any other problems with abnormal bleeding or bruising.  OB History: OB History    Gravida Para Term Preterm AB TAB SAB Ectopic Multiple Living   2 1 1       1       PMH:  Past Medical History  Diagnosis Date  . Hypertension   . Von Willebrand disease ([place])   . Allergy   . UTI (lower urinary tract  infection)     PSH:  Past Surgical History  Procedure Laterality Date  . Hemmeroid    . Gynecologic cryosurgery    . Cesarean section N/A 08/17/2013    Procedure: Primary Cesarean Section Delivery Baby Boy @ [id number], Apgars 9/10 ;  Surgeon: Lahoma Crocker, MD;  Location: [facility];  Service: Obstetrics;  Laterality: N/A;   Meds:  Current Outpatient Prescriptions on File Prior to Encounter  Medication Sig Dispense Refill  . calcium carbonate (TUMS EX) 750 MG chewable tablet Chew 1 tablet by mouth daily as needed for heartburn. Reported on 12/20/2015    . ferrous fumarate (HEMOCYTE - 106 MG FE) 325 (106 Fe) MG TABS tablet Take 1 tablet by mouth daily.    Marland Kitchen labetalol (NORMODYNE) 200 MG tablet Take 1 tablet (200 mg total) by mouth every 8 (eight) hours. (Patient taking differently: Take 200 mg by mouth 3 (three) times daily. ) 90 tablet 8  . metoCLOPramide (REGLAN) 10 MG tablet Take 10 mg by mouth every 8 (eight) hours as needed for nausea.    . Prenatal Vit-Fe Fumarate-FA (MULTIVITAMIN-PRENATAL) 27-0.8 MG TABS tablet Take 1 tablet by mouth daily at 12 noon.    . progesterone (PROMETRIUM) 200 MG capsule Insert 1 capsule intravaginally at bedtime. 30 capsule 5   Current Facility-Administered Medications on File Prior to Encounter  Medication Dose Route Frequency Provider Last Rate Last Dose  . rho (d) immune globulin (RHIG/RHOPHYLAC) injection 300 mcg  300 mcg Intramuscular Once Shelly Bombard, MD  Allergies: No Known Allergies   FH:  Family History  Problem Relation Age of Onset  . Diabetes Maternal Grandmother   . Hypertension Maternal Grandmother   . Cancer Maternal Grandfather   . Emphysema Maternal Grandfather   . Diabetes Paternal Grandmother   . Hypertension Paternal Grandmother   . Cancer Paternal Grandfather    Soc:  Social History   Social History  . Marital Status: Married    Spouse Name: N/A  . Number of Children: 1  . Years of Education: 16   Occupational  History  . Customer service rep    Social History Main Topics  . Smoking status: Never Smoker   . Smokeless tobacco: Never Used  . Alcohol Use: No  . Drug Use: No  . Sexual Activity:    Partners: Male    Museum/gallery curator: , None   Other Topics Concern  . Not on file   Social History Narrative   Fun: play with her child   Denies religious beliefs that would effect health care.     Review of Systems: no vaginal bleeding or cramping/contractions, no LOF, no nausea/vomiting. All other systems reviewed and are negative.   A/P: 1) Single IUP at 34w 1d  2) Hx of Chronic hypertension on Labetalol  3) Hx of previous C-section - for scheduled repeat C-section/ BTL on 17 March  4) ? Von Willebrand's disease - Lab work in 2014 (post partum) was within normal limits; however, recent lab work is somewhat more concerning for [place].  Would recommend an expedited Hematology referral prior to the patient's scheduled C-section.  I have placed a referral through [facility], but if able to be seen through the [facility] system sooner, this would also be a reasonable approach.  Treatment is not needed for the majority of women with VWD during the pregnancy due to a physiologic increase in Factor VIII and VWF.  Regional anesthesia is generally safe if VWF and factor VIII are > 50%.  Additionally,  the majority of hemorrhages occur in women with factor VIII levels less than 50 international units/dL who do not receive prophylaxis.    Although treatment is not needed during pregnancy in the majority of women with VWD, many require treatment during delivery and the one to three weeks thereafter. Knowledge of the woman's type of VWD, activity of factor VIII and VWF, current and prior responses to DDAVP is useful for guiding peripartum therapy. VWF and factor VIII should be maintained at 50 international units/dL or higher during delivery and for at least three to five days after delivery.  The highest risk is for a late post partum hemorrhage as factor VIII and VWF antigen values fall.  If possible, would have VWF concentrates (Hemate P) available to maintain levels of factor VIII and VWF fall below 50 international units/mL.  In patients who are known to respond to DDAVP, DDAVP can be administered prior to delivery. Subsequent doses can be given as needed, at approximately 12-hour intervals for two to four doses. It is generally recommended that factor VIII levels be at or above 50 percent for a cesarean section in patients with VWD.  Excessive bleeding may occur up to 21 or more days following delivery and should be treated with DDAVP or VWF concentrates (Hemate P) as appropriate to the bleeding episode. The average time of onset for postpartum hemorrhage in women with VWD is from 11 to 23 days post delivery. Thus, continuation of intermittent DDAVP or VWF  replacement therapy may be required for the first two to four weeks after delivery.   Elective surgical procedures on the newborn (eg, circumcision) should be delayed until the infant's VWD and factor VIII status have been determined.   Courtney [name]'s most recent lab work showed VWF antigen and Factor VIII activity < 50% - and will likely require therapy prior to her scheduled C-section if this is indeed real.  If unable to maintain Hemate P or obtain rapid turn around labs for Factor VIII and VWF antigen activity to guide therapy, it may be reasonable to transfer care for delivery at [facility] - would base this decision on Hematology recommendations once this referral is completed.   Thank you for the opportunity to be a part of the care of Courtney [name]. Please contact our office if we can be of further assistance.   I spent approximately 30 minutes with this patient with over 50% of time spent in face-to-face counseling.  Benjaman Lobe, MD Maternal Fetal Medicine

## 2016-01-29 ENCOUNTER — Ambulatory Visit (INDEPENDENT_AMBULATORY_CARE_PROVIDER_SITE_OTHER): Payer: 59 | Admitting: Certified Nurse Midwife

## 2016-01-29 VITALS — BP 110/67 | HR 96 | Temp 98.4°F | Wt 208.0 lb

## 2016-01-29 DIAGNOSIS — O09513 Supervision of elderly primigravida, third trimester: Secondary | ICD-10-CM

## 2016-01-29 DIAGNOSIS — D6800 Von Willebrand disease, unspecified: Secondary | ICD-10-CM

## 2016-01-29 DIAGNOSIS — O0993 Supervision of high risk pregnancy, unspecified, third trimester: Secondary | ICD-10-CM

## 2016-01-29 NOTE — Progress Notes (Signed)
Subjective:    Courtney [name] is a 35 y.o. female being seen today for her obstetrical visit. She is at [redacted]w[redacted]d gestation. Patient reports backache, no bleeding, no contractions, no cramping and no leaking. Fetal movement: normal.  Problem List Items Addressed This Visit    None    Visit Diagnoses    Supervision of high risk pregnancy in third trimester    -  Primary    Relevant Orders    POCT urinalysis dipstick    Von Willebrand disease ([place])        Relevant Orders    Ambulatory referral to Hematology      Patient Active Problem List   Diagnosis Date Noted  . Cervical shortening affecting pregnancy in third trimester, antepartum 11/23/2015  . Advanced maternal age in multigravida   . Essential hypertension 03/21/2015  . Depressive disorder, not elsewhere classified 09/06/2013  . Cesarean delivery delivered 08/20/2013  . Excessive fetal growth affecting management of mother, antepartum 07/27/2013  . Rh negative state in antepartum period 05/23/2013  . Normal IUP (intrauterine pregnancy) on prenatal ultrasound 12/16/2012  . Uterine fibroid in pregnancy 12/16/2012  . Vaginal bleeding in pregnancy 12/16/2012  . Subchorionic hemorrhage 12/16/2012   Objective:    BP 110/67 mmHg  Pulse 96  Temp(Src) 98.4 F (36.9 C)  Wt 208 lb (94.348 kg)  LMP 05/31/2015 (Exact Date) FHT:  140 BPM  Uterine Size: unable to determine d/t body habitus  Presentation: cephalic   NST: + accels, no decels, moderate variability.  Cat. 1 tracing.   Assessment:    Pregnancy @ [redacted]w[redacted]d weeks   Reactive NST Plan:   Repeat C-section and BTL scheduled  Hx of von Willebrands: hematology consult ordered per MFM recomondations   labs reviewed, problem list updated Consent signed. GBS sent TDAP offered  Rhogam given for RH negative Pediatrician: discussed. Infant feeding: plans to breastfeed. Maternity leave: discussed. Cigarette smoking: never smoked. Orders Placed This Encounter  Procedures   . Ambulatory referral to Hematology    Referral Priority:  Urgent    Referral Type:  Consultation    Referral Reason:  Specialty Services Required    Requested Specialty:  Oncology    Number of Visits Requested:  1  . POCT urinalysis dipstick   No orders of the defined types were placed in this encounter.   Follow up in 1 Week with NST for AMA and HTN.

## 2016-02-01 ENCOUNTER — Encounter (HOSPITAL_COMMUNITY): Payer: Self-pay

## 2016-02-01 ENCOUNTER — Ambulatory Visit (HOSPITAL_COMMUNITY)
Admission: RE | Admit: 2016-02-01 | Discharge: 2016-02-01 | Disposition: A | Discharge status: Home / Self Care | Payer: 59 | Source: Ambulatory Visit | Attending: Obstetrics | Admitting: Obstetrics

## 2016-02-01 ENCOUNTER — Other Ambulatory Visit (HOSPITAL_COMMUNITY): Payer: Self-pay | Admitting: Maternal and Fetal Medicine

## 2016-02-01 DIAGNOSIS — O34219 Maternal care for unspecified type scar from previous cesarean delivery: Secondary | ICD-10-CM | POA: Diagnosis not present

## 2016-02-01 DIAGNOSIS — O99213 Obesity complicating pregnancy, third trimester: Secondary | ICD-10-CM

## 2016-02-01 DIAGNOSIS — O10013 Pre-existing essential hypertension complicating pregnancy, third trimester: Secondary | ICD-10-CM

## 2016-02-01 DIAGNOSIS — O09293 Supervision of pregnancy with other poor reproductive or obstetric history, third trimester: Secondary | ICD-10-CM | POA: Diagnosis not present

## 2016-02-01 DIAGNOSIS — O09523 Supervision of elderly multigravida, third trimester: Secondary | ICD-10-CM | POA: Diagnosis not present

## 2016-02-01 DIAGNOSIS — D259 Leiomyoma of uterus, unspecified: Secondary | ICD-10-CM

## 2016-02-01 DIAGNOSIS — O3413 Maternal care for benign tumor of corpus uteri, third trimester: Secondary | ICD-10-CM

## 2016-02-01 DIAGNOSIS — Z3A35 35 weeks gestation of pregnancy: Secondary | ICD-10-CM | POA: Insufficient documentation

## 2016-02-01 DIAGNOSIS — O2693 Pregnancy related conditions, unspecified, third trimester: Secondary | ICD-10-CM

## 2016-02-01 DIAGNOSIS — O1493 Unspecified pre-eclampsia, third trimester: Secondary | ICD-10-CM | POA: Insufficient documentation

## 2016-02-04 ENCOUNTER — Encounter: Payer: Self-pay | Admitting: Oncology

## 2016-02-04 ENCOUNTER — Ambulatory Visit (INDEPENDENT_AMBULATORY_CARE_PROVIDER_SITE_OTHER): Payer: 59 | Admitting: Oncology

## 2016-02-04 VITALS — BP 123/66 | HR 94 | Temp 98.7°F | Ht 63.0 in | Wt 212.3 lb

## 2016-02-04 DIAGNOSIS — D6801 Von willebrand disease, type 1: Secondary | ICD-10-CM

## 2016-02-04 DIAGNOSIS — Z3A00 Weeks of gestation of pregnancy not specified: Secondary | ICD-10-CM

## 2016-02-04 DIAGNOSIS — O0993 Supervision of high risk pregnancy, unspecified, third trimester: Secondary | ICD-10-CM

## 2016-02-04 DIAGNOSIS — O99113 Other diseases of the blood and blood-forming organs and certain disorders involving the immune mechanism complicating pregnancy, third trimester: Secondary | ICD-10-CM | POA: Diagnosis not present

## 2016-02-04 DIAGNOSIS — D680 Von Willebrand's disease: Secondary | ICD-10-CM | POA: Diagnosis not present

## 2016-02-04 HISTORY — DX: Von willebrand disease, type 1: D68.01

## 2016-02-04 HISTORY — DX: Supervision of high risk pregnancy, unspecified, third trimester: O09.93

## 2016-02-04 HISTORY — DX: Von Willebrand's disease: D68.0

## 2016-02-04 LAB — CBC WITH DIFFERENTIAL/PLATELET
BASOS ABS: 0 10*3/uL (ref 0.0–0.1)
Basophils Relative: 0 %
Eosinophils Absolute: 0.1 10*3/uL (ref 0.0–0.7)
Eosinophils Relative: 1 %
HEMATOCRIT: 37.8 % (ref 36.0–46.0)
Hemoglobin: 12.3 g/dL (ref 12.0–15.0)
LYMPHS ABS: 1.8 10*3/uL (ref 0.7–4.0)
LYMPHS PCT: 20 %
MCH: 29.1 pg (ref 26.0–34.0)
MCHC: 32.5 g/dL (ref 30.0–36.0)
MCV: 89.6 fL (ref 78.0–100.0)
MONO ABS: 0.7 10*3/uL (ref 0.1–1.0)
Monocytes Relative: 8 %
NEUTROS ABS: 6.6 10*3/uL (ref 1.7–7.7)
Neutrophils Relative %: 71 %
Platelets: 131 10*3/uL — ABNORMAL LOW (ref 150–400)
RBC: 4.22 MIL/uL (ref 3.87–5.11)
RDW: 14.8 % (ref 11.5–15.5)
WBC: 9.2 10*3/uL (ref 4.0–10.5)

## 2016-02-04 LAB — APTT: APTT: 28 s (ref 24–37)

## 2016-02-04 NOTE — Progress Notes (Signed)
Patient ID: Courtney [name], female   DOB: February 17, 1981, 35 y.o.   MRN: [id number] New Patient Hematology   Courtney [name] [id number] Sep 07, 1981 35 y.o. 02/04/2016  CC: Dr. Baltazar Najjar   Reason for referral: von Willebrand's disorder in third trimester, second pregnancy   HPI:  Pleasant 35 year old woman who moved here from the [place] when she was 35 years old. As part of a routine evaluation for menorrhagia at age 58,she was found to have type I von Willebrand's disorder. Her periods were controlled nicely on oral contraceptives and the von Willebrand's did not become an issue. She never saw a hematologist. She was first pregnant at age 20. She had a very prolonged 25 hour labor, became preeclamptic, and had to have an emergency cesarean section under general anesthesia. She did not develop any postpartum hemorrhage. She has never required a blood transfusion. She is Rh-. Her son from that birth was circumcised without any excessive bleeding. She has not had any other major surgical procedures. No dental extractions although she remembers having to have a buccal wound sutured when a dental instrument inadvertently scratched the mucosa. She had a hemorrhoidectomy at age 13 without excessive bleeding.  She is now in the third trimester of her second pregnancy out 35 weeks and 4 days. She has been hypertensive and was put on labetalol 200 mg every 8 hours.  A von Willebrand profile done at time of her first pregnancy in September 2014 showed the expected elevation of von Willebrand antigen, ristocetin cofactor activity, and factor VIII activity that one usually sees during pregnancy with values of 144%, 80%, and 182% respectively. A von Willebrand profile done on 12/27/2015 unexpectedly shows all of her factors are decreased with von Willebrand antigen 47%, ristocetin cofactor activity 32%, factor VIII activity 45%. A multimer analysis shows all normal multimers consistent with  type I VW.  She is having some low-grade epistaxis about once a week. No headaches or change in vision.  There is no family history of von Willebrand's disorder in her parents or her siblings. Mother has anemia age 52. Father still living in the [place]. Her 59 year old sister has had 3 children without any problems. A 58 year old sister and a 107 year old brother are healthy. She states that all of her siblings are anemic.   PMH: Past Medical History  Diagnosis Date  . Hypertension   . Von Willebrand disease ([place])   . Allergy   . UTI (lower urinary tract infection)   no history of hepatitis, yellow jaundice, mononucleosis.  Past Surgical History  Procedure Laterality Date  . Hemmeroid    . Gynecologic cryosurgery    . Cesarean section N/A 08/17/2013    Procedure: Primary Cesarean Section Delivery Baby Boy @ [id number], Apgars 9/10 ;  Surgeon: Lahoma Crocker, MD;  Location: [facility];  Service: Obstetrics;  Laterality: N/A;    Allergies: No Known Allergies  Medications:  Current outpatient prescriptions:  .  calcium carbonate (TUMS EX) 750 MG chewable tablet, Chew 1 tablet by mouth daily as needed for heartburn. Reported on 12/20/2015, Disp: , Rfl:  .  ferrous fumarate (HEMOCYTE - 106 MG FE) 325 (106 Fe) MG TABS tablet, Take 1 tablet by mouth daily., Disp: , Rfl:  .  labetalol (NORMODYNE) 200 MG tablet, Take 1 tablet (200 mg total) by mouth every 8 (eight) hours. (Patient taking differently: Take 200 mg by mouth 3 (three) times daily. ), Disp: 90 tablet, Rfl: 8 .  metoCLOPramide (REGLAN) 10 MG tablet, Take 10  mg by mouth every 8 (eight) hours as needed for nausea., Disp: , Rfl:  .  Prenatal Vit-Fe Fumarate-FA (MULTIVITAMIN-PRENATAL) 27-0.8 MG TABS tablet, Take 1 tablet by mouth daily at 12 noon., Disp: , Rfl:  .  progesterone (PROMETRIUM) 200 MG capsule, Insert 1 capsule intravaginally at bedtime., Disp: 30 capsule, Rfl: 5  Current facility-administered medications:  .  rho (d)  immune globulin (RHIG/RHOPHYLAC) injection 300 mcg, 300 mcg, Intramuscular, Once, Shelly Bombard, MD  Social History:   reports that she has never smoked. She has never used smokeless tobacco. She reports that she does not drink alcohol or use illicit drugs.  Family History: Family History  Problem Relation Age of Onset  . Diabetes Maternal Grandmother   . Hypertension Maternal Grandmother   . Cancer Maternal Grandfather   . Emphysema Maternal Grandfather   . Diabetes Paternal Grandmother   . Hypertension Paternal Grandmother   . Cancer Paternal Grandfather     Review of Systems: See HPI Remaining ROS negative.  Physical Exam: Blood pressure 123/66, pulse 94, temperature 98.7 F (37.1 C), temperature source Oral, height 5\' 3"  (1.6 m), weight 212 lb 4.8 oz (96.299 kg), last menstrual period 05/31/2015, SpO2 99 %, currently breastfeeding. Wt Readings from Last 3 Encounters:  02/04/16 212 lb 4.8 oz (96.299 kg)  02/01/16 209 lb 3.2 oz (94.892 kg)  01/29/16 208 lb (94.348 kg)     General appearance: well-nourished African-American woman HENNT: Pharynx no erythema, exudate, mass, or ulcer. No thyromegaly or thyroid nodules Lymph nodes: No cervical, supraclavicular,adenopathy Breasts:  Lungs: Clear to auscultation, resonant to percussion throughout Heart: Regular rhythm, no murmur, no gallop, no rub, no click, no edema Abdomen: near term pregnancy Extremities: No edema, no calf tenderness Musculoskeletal: no joint deformities GU:  Vascular: Carotid pulses 2+, no bruits,  Neurologic: Alert, oriented, PERRLA, optic discs sharp and vessels normal, no hemorrhage or exudate, cranial nerves grossly normal, motor strength 5 over 5, reflexes 1+ symmetric, upper body coordination normal, gait normal, Skin: No rash or ecchymosis    Lab Results: Lab Results  Component Value Date   WBC 9.2 02/04/2016   HGB 12.3 02/04/2016   HCT 37.8 02/04/2016   MCV 89.6 02/04/2016   PLT 131*  02/04/2016     Chemistry      Component Value Date/Time   NA 135 08/17/2013 0315   K 4.7 08/17/2013 0315   CL 100 08/17/2013 0315   CO2 19 08/17/2013 0315   BUN 8 08/17/2013 0315   CREATININE 0.68 08/17/2013 0315      Component Value Date/Time   CALCIUM 9.9 08/17/2013 0315   ALKPHOS 115 08/17/2013 0315   AST 32 08/17/2013 0315   ALT 17 08/17/2013 0315   BILITOT 0.4 08/17/2013 0315       Impression and Plan: Type 1 von Willebrand's disorder in third trimester pregnancy.  I suspect she has mild VW disease based on her history and absence of postpartum bleeding with her first child. The low factor levels at this point in the pregnancy are unusual. I will repeat them today to see if this is a consistent finding and not just a laboratory error.  She is going to have a planned cesarean section on March 17. Optimal management will be to administer clotting factor concentrate, Humate P, 1 hour prior to planned C-section and then every 8-12 hours for the next 72 hours. I will make arrangements to have this product available at [facility] and put in the orders. Dose  for this kind of surgery on the order of 50-75 units per kilogram. Current weight 212 pounds equals 96 kg. Loading dose 60 units/kg equals 5760 units.  I spent time educating and counseling the patient about von Willebrand's disorder, the products available to support her, and a peripartum treatment plan. At some point in the near future after she has her child, I would like to do provocative testing with DDAVP to assess whether she responds to this agent which can be very useful to cover most minor surgical procedures.  I can be reached 24/7 on my cell phone [phone] if problems arise.     Annia Belt, MD 02/04/2016, 6:43 PM

## 2016-02-04 NOTE — Patient Instructions (Signed)
To lab Return visit 3/27 1:30 PM  OK to schedule per Dr Darnell Level

## 2016-02-05 ENCOUNTER — Ambulatory Visit (INDEPENDENT_AMBULATORY_CARE_PROVIDER_SITE_OTHER): Payer: 59 | Admitting: Obstetrics

## 2016-02-05 ENCOUNTER — Encounter: Payer: Self-pay | Admitting: Obstetrics

## 2016-02-05 VITALS — BP 106/68 | HR 105 | Wt 211.0 lb

## 2016-02-05 DIAGNOSIS — Z3483 Encounter for supervision of other normal pregnancy, third trimester: Secondary | ICD-10-CM

## 2016-02-05 LAB — COAG STUDIES INTERP REPORT: PDF Image: 0

## 2016-02-05 LAB — VON WILLEBRAND PANEL
COAGULATION FACTOR VIII: 141 % (ref 57–163)
Ristocetin Co-factor, Plasma: 65 % (ref 50–200)
Von Willebrand Antigen, Plasma: 146 % (ref 50–200)

## 2016-02-05 LAB — POCT URINALYSIS DIPSTICK
BILIRUBIN UA: NEGATIVE
Glucose, UA: NEGATIVE
KETONES UA: NEGATIVE
Nitrite, UA: NEGATIVE
PROTEIN UA: NEGATIVE
SPEC GRAV UA: 1.015
Urobilinogen, UA: NEGATIVE
pH, UA: 5

## 2016-02-05 NOTE — Progress Notes (Signed)
Subjective:    Courtney [name] is a 35 y.o. female being seen today for her obstetrical visit. She is at [redacted]w[redacted]d gestation. Patient reports no complaints. Fetal movement: normal.  Problem List Items Addressed This Visit    None    Visit Diagnoses    Encounter for supervision of other normal pregnancy in third trimester    -  Primary    Relevant Orders    POCT urinalysis dipstick (Completed)      Patient Active Problem List   Diagnosis Date Noted  . Von Willebrand disease type IA (Courtney [name]) 02/04/2016  . Supervision of high risk pregnancy in third trimester 02/04/2016  . Cervical shortening affecting pregnancy in third trimester, antepartum 11/23/2015  . Advanced maternal age in multigravida   . Essential hypertension 03/21/2015  . Depressive disorder, not elsewhere classified 09/06/2013  . Cesarean delivery delivered 08/20/2013  . Excessive fetal growth affecting management of mother, antepartum 07/27/2013  . Rh negative state in antepartum period 05/23/2013  . Normal IUP (intrauterine pregnancy) on prenatal ultrasound 12/16/2012  . Uterine fibroid in pregnancy 12/16/2012  . Vaginal bleeding in pregnancy 12/16/2012  . Subchorionic hemorrhage 12/16/2012   Objective:    BP 106/68 mmHg  Pulse 105  Wt 211 lb (95.709 kg)  LMP 05/31/2015 (Exact Date) FHT:  150 BPM  Uterine Size: size greater than dates  Presentation: unsure     Assessment:    Pregnancy @ [redacted]w[redacted]d weeks   Plan:     labs reviewed, problem list updated Consent signed. GBS sent TDAP offered  Rhogam given for RH negative Pediatrician: discussed. Infant feeding: plans to breastfeed. Maternity leave: discussed. Cigarette smoking: never smoked. Orders Placed This Encounter  Procedures  . POCT urinalysis dipstick   No orders of the defined types were placed in this encounter.   Follow up in 1 Week.

## 2016-02-07 ENCOUNTER — Telehealth: Payer: Self-pay | Admitting: *Deleted

## 2016-02-07 NOTE — Telephone Encounter (Signed)
Pt called / informed "Von Willebrand levels all much better than last time as I expected - we are in good shape for the delivery. We will still use the Humate P clotting factors to cover C section." per  Dr Beryle Beams - pt stated ok.

## 2016-02-07 NOTE — Telephone Encounter (Signed)
-----   Message from Annia Belt, MD sent at 02/06/2016  2:34 PM EST ----- RN: Call pt: Von Willebrand levels all much better than last time as I expected - we are in good shape for the delivery. We will still use the Humate P clotting factors to cover C section.

## 2016-02-08 ENCOUNTER — Ambulatory Visit (HOSPITAL_COMMUNITY)
Admission: RE | Admit: 2016-02-08 | Discharge: 2016-02-08 | Disposition: A | Discharge status: Home / Self Care | Payer: 59 | Source: Ambulatory Visit | Attending: Obstetrics | Admitting: Obstetrics

## 2016-02-08 ENCOUNTER — Encounter (HOSPITAL_COMMUNITY): Payer: Self-pay

## 2016-02-08 ENCOUNTER — Other Ambulatory Visit (HOSPITAL_COMMUNITY): Payer: Self-pay | Admitting: Maternal and Fetal Medicine

## 2016-02-08 DIAGNOSIS — O10013 Pre-existing essential hypertension complicating pregnancy, third trimester: Secondary | ICD-10-CM | POA: Diagnosis present

## 2016-02-08 DIAGNOSIS — O09293 Supervision of pregnancy with other poor reproductive or obstetric history, third trimester: Secondary | ICD-10-CM

## 2016-02-08 DIAGNOSIS — O2693 Pregnancy related conditions, unspecified, third trimester: Secondary | ICD-10-CM

## 2016-02-08 DIAGNOSIS — O34219 Maternal care for unspecified type scar from previous cesarean delivery: Secondary | ICD-10-CM | POA: Diagnosis not present

## 2016-02-08 DIAGNOSIS — O99213 Obesity complicating pregnancy, third trimester: Secondary | ICD-10-CM

## 2016-02-08 DIAGNOSIS — O09523 Supervision of elderly multigravida, third trimester: Secondary | ICD-10-CM

## 2016-02-08 DIAGNOSIS — E669 Obesity, unspecified: Secondary | ICD-10-CM | POA: Diagnosis not present

## 2016-02-08 DIAGNOSIS — D259 Leiomyoma of uterus, unspecified: Secondary | ICD-10-CM

## 2016-02-08 DIAGNOSIS — O3412 Maternal care for benign tumor of corpus uteri, second trimester: Secondary | ICD-10-CM | POA: Insufficient documentation

## 2016-02-08 DIAGNOSIS — O3413 Maternal care for benign tumor of corpus uteri, third trimester: Secondary | ICD-10-CM

## 2016-02-11 ENCOUNTER — Encounter: Payer: 59 | Admitting: Oncology

## 2016-02-12 ENCOUNTER — Ambulatory Visit (INDEPENDENT_AMBULATORY_CARE_PROVIDER_SITE_OTHER): Payer: 59 | Admitting: Obstetrics

## 2016-02-12 VITALS — BP 104/65 | HR 96 | Temp 98.6°F | Wt 213.0 lb

## 2016-02-12 DIAGNOSIS — D6800 Von Willebrand disease, unspecified: Secondary | ICD-10-CM

## 2016-02-12 DIAGNOSIS — I10 Essential (primary) hypertension: Secondary | ICD-10-CM

## 2016-02-12 DIAGNOSIS — O0993 Supervision of high risk pregnancy, unspecified, third trimester: Secondary | ICD-10-CM

## 2016-02-12 LAB — POCT URINALYSIS DIPSTICK
BILIRUBIN UA: NEGATIVE
GLUCOSE UA: NEGATIVE
KETONES UA: NEGATIVE
Leukocytes, UA: NEGATIVE
Nitrite, UA: NEGATIVE
PH UA: 6
RBC UA: NEGATIVE
SPEC GRAV UA: 1.02
Urobilinogen, UA: NEGATIVE

## 2016-02-13 ENCOUNTER — Encounter: Payer: Self-pay | Admitting: Obstetrics

## 2016-02-13 NOTE — Progress Notes (Signed)
Subjective:    Courtney [name] is a 35 y.o. female being seen today for her obstetrical visit. She is at [redacted]w[redacted]d gestation. Patient reports no complaints. Fetal movement: normal.  Problem List Items Addressed This Visit    Supervision of high risk pregnancy in third trimester - Primary   Relevant Orders   POCT urinalysis dipstick (Completed)     Patient Active Problem List   Diagnosis Date Noted  . Von Willebrand disease type IA ([place]) 02/04/2016  . Supervision of high risk pregnancy in third trimester 02/04/2016  . Cervical shortening affecting pregnancy in third trimester, antepartum 11/23/2015  . Advanced maternal age in multigravida   . Essential hypertension 03/21/2015  . Depressive disorder, not elsewhere classified 09/06/2013  . Cesarean delivery delivered 08/20/2013  . Excessive fetal growth affecting management of mother, antepartum 07/27/2013  . Rh negative state in antepartum period 05/23/2013  . Normal IUP (intrauterine pregnancy) on prenatal ultrasound 12/16/2012  . Uterine fibroid in pregnancy 12/16/2012  . Vaginal bleeding in pregnancy 12/16/2012  . Subchorionic hemorrhage 12/16/2012   Objective:    BP 104/65 mmHg  Pulse 96  Temp(Src) 98.6 F (37 C)  Wt 213 lb (96.616 kg)  LMP 05/31/2015 (Exact Date) FHT:  150 BPM  Uterine Size: size greater than dates  Presentation: unsure     Assessment:    Pregnancy @ [redacted]w[redacted]d weeks   Plan:     labs reviewed, problem list updated Consent signed. GBS sent TDAP offered  Rhogam given for RH negative Pediatrician: discussed. Infant feeding: plans to breastfeed. Maternity leave: discussed. Cigarette smoking: never smoked. Orders Placed This Encounter  Procedures  . POCT urinalysis dipstick   No orders of the defined types were placed in this encounter.   Follow up in 1 Week.

## 2016-02-14 ENCOUNTER — Encounter: Payer: Self-pay | Admitting: Oncology

## 2016-02-14 NOTE — Progress Notes (Unsigned)
Patient ID: Courtney [name], female   DOB: 05-Apr-1981, 35 y.o.   MRN: [id number] 34 year old woman with Type 1A Von Willebrand's disease for elective C-section on 02/29/16 Clarification of Humate-P dosing: Calculate dose based on the Von Willebrand Ristocetin Cofactor Activity Loading dose 60 units/kg  1 hour before surgery Use Pre-pregnant weight: use 80 kg Give second dose 50 units/kg 8 hours after the first, third dose 12 hours later then Q 12 hours until factor levels available.

## 2016-02-15 ENCOUNTER — Ambulatory Visit (HOSPITAL_COMMUNITY)
Admission: RE | Admit: 2016-02-15 | Discharge: 2016-02-15 | Disposition: A | Discharge status: Home / Self Care | Payer: 59 | Source: Ambulatory Visit | Attending: Obstetrics | Admitting: Obstetrics

## 2016-02-15 DIAGNOSIS — O09523 Supervision of elderly multigravida, third trimester: Secondary | ICD-10-CM

## 2016-02-15 DIAGNOSIS — O10013 Pre-existing essential hypertension complicating pregnancy, third trimester: Secondary | ICD-10-CM

## 2016-02-15 DIAGNOSIS — O99113 Other diseases of the blood and blood-forming organs and certain disorders involving the immune mechanism complicating pregnancy, third trimester: Secondary | ICD-10-CM | POA: Insufficient documentation

## 2016-02-15 DIAGNOSIS — O99213 Obesity complicating pregnancy, third trimester: Secondary | ICD-10-CM | POA: Insufficient documentation

## 2016-02-15 DIAGNOSIS — O113 Pre-existing hypertension with pre-eclampsia, third trimester: Secondary | ICD-10-CM | POA: Diagnosis not present

## 2016-02-15 DIAGNOSIS — Z3A37 37 weeks gestation of pregnancy: Secondary | ICD-10-CM | POA: Diagnosis not present

## 2016-02-15 DIAGNOSIS — D680 Von Willebrand's disease: Secondary | ICD-10-CM | POA: Insufficient documentation

## 2016-02-15 DIAGNOSIS — O34219 Maternal care for unspecified type scar from previous cesarean delivery: Secondary | ICD-10-CM | POA: Insufficient documentation

## 2016-02-19 ENCOUNTER — Encounter: Payer: Self-pay | Admitting: Obstetrics

## 2016-02-19 ENCOUNTER — Ambulatory Visit (INDEPENDENT_AMBULATORY_CARE_PROVIDER_SITE_OTHER): Payer: 59 | Admitting: Obstetrics

## 2016-02-19 VITALS — BP 104/68 | HR 101 | Temp 98.7°F | Wt 215.0 lb

## 2016-02-19 DIAGNOSIS — O0993 Supervision of high risk pregnancy, unspecified, third trimester: Secondary | ICD-10-CM

## 2016-02-19 NOTE — Progress Notes (Signed)
Subjective:    Courtney [name] is a 35 y.o. female being seen today for her obstetrical visit. She is at [redacted]w[redacted]d gestation. Patient reports no complaints. Fetal movement: normal.  Problem List Items Addressed This Visit    Supervision of high risk pregnancy in third trimester - Primary   Relevant Orders   POCT urinalysis dipstick     Patient Active Problem List   Diagnosis Date Noted  . Von Willebrand disease type IA ([place]) 02/04/2016  . Supervision of high risk pregnancy in third trimester 02/04/2016  . Cervical shortening affecting pregnancy in third trimester, antepartum 11/23/2015  . Advanced maternal age in multigravida   . Essential hypertension 03/21/2015  . Depressive disorder, not elsewhere classified 09/06/2013  . Cesarean delivery delivered 08/20/2013  . Excessive fetal growth affecting management of mother, antepartum 07/27/2013  . Rh negative state in antepartum period 05/23/2013  . Normal IUP (intrauterine pregnancy) on prenatal ultrasound 12/16/2012  . Uterine fibroid in pregnancy 12/16/2012  . Vaginal bleeding in pregnancy 12/16/2012  . Subchorionic hemorrhage 12/16/2012    Objective:    BP 104/68 mmHg  Pulse 101  Temp(Src) 98.7 F (37.1 C)  Wt 215 lb (97.523 kg)  LMP 05/31/2015 (Exact Date) FHT: 150 BPM  Uterine Size: size greater than dates    Assessment:    Pregnancy @ [redacted]w[redacted]d weeks   Plan:   Plans for delivery: C/Section scheduled; labs reviewed; problem list updated Counseling: Consent signed. Infant feeding: plans to breastfeed. Cigarette smoking: never smoked. L&D discussion: symptoms of labor, discussed when to call, discussed what number to call, anesthetic/analgesic options reviewed and delivering clinician:  plans Physician. Postpartum supports and preparation: circumcision discussed and contraception plans discussed.  Follow up in 1 Week.

## 2016-02-20 ENCOUNTER — Other Ambulatory Visit: Payer: Self-pay | Admitting: Obstetrics

## 2016-02-22 ENCOUNTER — Other Ambulatory Visit (HOSPITAL_COMMUNITY): Payer: Self-pay | Admitting: Maternal and Fetal Medicine

## 2016-02-22 ENCOUNTER — Ambulatory Visit (HOSPITAL_COMMUNITY)
Admission: RE | Admit: 2016-02-22 | Discharge: 2016-02-22 | Disposition: A | Discharge status: Home / Self Care | Payer: 59 | Source: Ambulatory Visit | Attending: Obstetrics | Admitting: Obstetrics

## 2016-02-22 ENCOUNTER — Encounter: Payer: Self-pay | Admitting: Oncology

## 2016-02-22 DIAGNOSIS — O283 Abnormal ultrasonic finding on antenatal screening of mother: Secondary | ICD-10-CM | POA: Insufficient documentation

## 2016-02-22 DIAGNOSIS — D259 Leiomyoma of uterus, unspecified: Secondary | ICD-10-CM

## 2016-02-22 DIAGNOSIS — O99213 Obesity complicating pregnancy, third trimester: Secondary | ICD-10-CM

## 2016-02-22 DIAGNOSIS — O10013 Pre-existing essential hypertension complicating pregnancy, third trimester: Secondary | ICD-10-CM

## 2016-02-22 DIAGNOSIS — O34219 Maternal care for unspecified type scar from previous cesarean delivery: Secondary | ICD-10-CM | POA: Diagnosis not present

## 2016-02-22 DIAGNOSIS — O09523 Supervision of elderly multigravida, third trimester: Secondary | ICD-10-CM

## 2016-02-22 DIAGNOSIS — Z3A38 38 weeks gestation of pregnancy: Secondary | ICD-10-CM | POA: Diagnosis not present

## 2016-02-22 DIAGNOSIS — O3413 Maternal care for benign tumor of corpus uteri, third trimester: Secondary | ICD-10-CM

## 2016-02-22 DIAGNOSIS — O2693 Pregnancy related conditions, unspecified, third trimester: Secondary | ICD-10-CM

## 2016-02-22 NOTE — Progress Notes (Unsigned)
Patient ID: [name], female   DOB: 04-14-1981, 35 y.o.   MRN: [id number] I had an email consult with Dr Alvira Philips, international Heme expert,  re management of peri-partum C section in pt w mild Von Willebrand's disease. Based on fact that our patient Vinzen-Cherry had normal factor VIII & VW factor activity when I checked last month, she would not give prophylactic Humate-P before or after C-section unless the patient has any major bleeding in excess of what would be expected from routine C-section.  I will therefor cancel previous standing orders and just have product available in event of emergency Dr Annye Rusk  [phone]

## 2016-02-26 ENCOUNTER — Encounter (HOSPITAL_COMMUNITY): Payer: Self-pay | Admitting: Anesthesiology

## 2016-02-26 ENCOUNTER — Inpatient Hospital Stay (HOSPITAL_COMMUNITY)
Admission: AD | Admit: 2016-02-26 | Discharge: 2016-02-29 | DRG: 765 | Disposition: A | Discharge status: Home / Self Care | Payer: 59 | Source: Ambulatory Visit | Attending: Obstetrics | Admitting: Obstetrics

## 2016-02-26 ENCOUNTER — Encounter: Payer: Self-pay | Admitting: Obstetrics

## 2016-02-26 ENCOUNTER — Encounter (HOSPITAL_COMMUNITY)
Admission: AD | Disposition: A | Discharge status: Home / Self Care | Payer: Self-pay | Source: Ambulatory Visit | Attending: Obstetrics

## 2016-02-26 ENCOUNTER — Ambulatory Visit (INDEPENDENT_AMBULATORY_CARE_PROVIDER_SITE_OTHER): Payer: 59 | Admitting: Obstetrics

## 2016-02-26 ENCOUNTER — Inpatient Hospital Stay (HOSPITAL_COMMUNITY): Payer: 59 | Admitting: Anesthesiology

## 2016-02-26 VITALS — BP 117/75 | HR 93 | Wt 218.0 lb

## 2016-02-26 DIAGNOSIS — O9913 Other diseases of the blood and blood-forming organs and certain disorders involving the immune mechanism complicating the puerperium: Secondary | ICD-10-CM | POA: Diagnosis not present

## 2016-02-26 DIAGNOSIS — Z8249 Family history of ischemic heart disease and other diseases of the circulatory system: Secondary | ICD-10-CM | POA: Diagnosis not present

## 2016-02-26 DIAGNOSIS — O9081 Anemia of the puerperium: Secondary | ICD-10-CM | POA: Diagnosis not present

## 2016-02-26 DIAGNOSIS — Z302 Encounter for sterilization: Secondary | ICD-10-CM

## 2016-02-26 DIAGNOSIS — D680 Von Willebrand's disease: Secondary | ICD-10-CM | POA: Diagnosis present

## 2016-02-26 DIAGNOSIS — Z825 Family history of asthma and other chronic lower respiratory diseases: Secondary | ICD-10-CM

## 2016-02-26 DIAGNOSIS — Z3A38 38 weeks gestation of pregnancy: Secondary | ICD-10-CM | POA: Diagnosis not present

## 2016-02-26 DIAGNOSIS — Z833 Family history of diabetes mellitus: Secondary | ICD-10-CM | POA: Diagnosis not present

## 2016-02-26 DIAGNOSIS — D649 Anemia, unspecified: Secondary | ICD-10-CM | POA: Diagnosis not present

## 2016-02-26 DIAGNOSIS — R310 Gross hematuria: Secondary | ICD-10-CM | POA: Insufficient documentation

## 2016-02-26 DIAGNOSIS — D696 Thrombocytopenia, unspecified: Secondary | ICD-10-CM | POA: Insufficient documentation

## 2016-02-26 DIAGNOSIS — O99119 Other diseases of the blood and blood-forming organs and certain disorders involving the immune mechanism complicating pregnancy, unspecified trimester: Secondary | ICD-10-CM | POA: Insufficient documentation

## 2016-02-26 DIAGNOSIS — R319 Hematuria, unspecified: Secondary | ICD-10-CM | POA: Diagnosis present

## 2016-02-26 DIAGNOSIS — Z3483 Encounter for supervision of other normal pregnancy, third trimester: Secondary | ICD-10-CM

## 2016-02-26 DIAGNOSIS — Z98891 History of uterine scar from previous surgery: Secondary | ICD-10-CM

## 2016-02-26 DIAGNOSIS — O9912 Other diseases of the blood and blood-forming organs and certain disorders involving the immune mechanism complicating childbirth: Secondary | ICD-10-CM | POA: Diagnosis present

## 2016-02-26 DIAGNOSIS — D6801 Von willebrand disease, type 1: Secondary | ICD-10-CM | POA: Insufficient documentation

## 2016-02-26 LAB — CBC WITH DIFFERENTIAL/PLATELET
BASOS PCT: 0 %
Basophils Absolute: 0 10*3/uL (ref 0.0–0.1)
EOS ABS: 0.1 10*3/uL (ref 0.0–0.7)
EOS PCT: 1 %
HCT: 38.2 % (ref 36.0–46.0)
HEMOGLOBIN: 13.1 g/dL (ref 12.0–15.0)
LYMPHS ABS: 2.7 10*3/uL (ref 0.7–4.0)
Lymphocytes Relative: 25 %
MCH: 29.7 pg (ref 26.0–34.0)
MCHC: 34.3 g/dL (ref 30.0–36.0)
MCV: 86.6 fL (ref 78.0–100.0)
MONOS PCT: 5 %
Monocytes Absolute: 0.6 10*3/uL (ref 0.1–1.0)
NEUTROS PCT: 69 %
Neutro Abs: 7.4 10*3/uL (ref 1.7–7.7)
PLATELETS: 134 10*3/uL — AB (ref 150–400)
RBC: 4.41 MIL/uL (ref 3.87–5.11)
RDW: 15 % (ref 11.5–15.5)
WBC: 10.7 10*3/uL — AB (ref 4.0–10.5)

## 2016-02-26 LAB — POCT URINALYSIS DIPSTICK
BILIRUBIN UA: NEGATIVE
Blood, UA: 50
GLUCOSE UA: NEGATIVE
Ketones, UA: NEGATIVE
NITRITE UA: NEGATIVE
Spec Grav, UA: 1.02
UROBILINOGEN UA: NEGATIVE
pH, UA: 5

## 2016-02-26 SURGERY — Surgical Case
Anesthesia: General | Site: Abdomen

## 2016-02-26 MED ORDER — OXYTOCIN 10 UNIT/ML IJ SOLN
40.0000 [IU] | INTRAVENOUS | Status: DC | PRN
  Administered 2016-02-26: 40 [IU] via INTRAVENOUS

## 2016-02-26 MED ORDER — CITRIC ACID-SODIUM CITRATE 334-500 MG/5ML PO SOLN
ORAL | Status: AC
  Administered 2016-02-26: 30 mL
  Filled 2016-02-26: qty 15

## 2016-02-26 MED ORDER — HYDROMORPHONE HCL 1 MG/ML IJ SOLN
INTRAMUSCULAR | Status: AC
  Administered 2016-02-26: 0.5 mg via INTRAVENOUS
  Filled 2016-02-26: qty 1

## 2016-02-26 MED ORDER — HYDROMORPHONE HCL 1 MG/ML IJ SOLN
INTRAMUSCULAR | Status: AC
  Filled 2016-02-26: qty 1

## 2016-02-26 MED ORDER — TERBUTALINE SULFATE 1 MG/ML IJ SOLN
0.2500 mg | Freq: Once | INTRAMUSCULAR | Status: AC
  Administered 2016-02-26: 0.25 mg via SUBCUTANEOUS

## 2016-02-26 MED ORDER — ONDANSETRON HCL 4 MG/2ML IJ SOLN
INTRAMUSCULAR | Status: AC
  Filled 2016-02-26: qty 2

## 2016-02-26 MED ORDER — HYDROMORPHONE HCL 1 MG/ML IJ SOLN
0.2500 mg | INTRAMUSCULAR | Status: DC | PRN
  Administered 2016-02-26 (×3): 0.5 mg via INTRAVENOUS

## 2016-02-26 MED ORDER — SCOPOLAMINE 1 MG/3DAYS TD PT72
MEDICATED_PATCH | TRANSDERMAL | Status: AC
  Filled 2016-02-26: qty 1

## 2016-02-26 MED ORDER — DEXAMETHASONE SODIUM PHOSPHATE 10 MG/ML IJ SOLN
INTRAMUSCULAR | Status: AC
  Filled 2016-02-26: qty 1

## 2016-02-26 MED ORDER — MIDAZOLAM HCL 2 MG/2ML IJ SOLN
INTRAMUSCULAR | Status: AC
  Filled 2016-02-26: qty 2

## 2016-02-26 MED ORDER — MIDAZOLAM HCL 5 MG/5ML IJ SOLN
INTRAMUSCULAR | Status: DC | PRN
  Administered 2016-02-26: 2 mg via INTRAVENOUS

## 2016-02-26 MED ORDER — FENTANYL CITRATE (PF) 100 MCG/2ML IJ SOLN
INTRAMUSCULAR | Status: DC | PRN
  Administered 2016-02-26: 150 ug via INTRAVENOUS
  Administered 2016-02-26 (×2): 100 ug via INTRAVENOUS
  Administered 2016-02-26: 150 ug via INTRAVENOUS

## 2016-02-26 MED ORDER — PROPOFOL 10 MG/ML IV BOLUS
INTRAVENOUS | Status: AC
  Filled 2016-02-26: qty 20

## 2016-02-26 MED ORDER — FAMOTIDINE IN NACL 20-0.9 MG/50ML-% IV SOLN
INTRAVENOUS | Status: AC
  Filled 2016-02-26: qty 50

## 2016-02-26 MED ORDER — LACTATED RINGERS IV SOLN
INTRAVENOUS | Status: DC | PRN
Start: 2016-02-26 — End: 2016-02-26
  Administered 2016-02-26: 21:00:00 via INTRAVENOUS

## 2016-02-26 MED ORDER — SUCCINYLCHOLINE CHLORIDE 20 MG/ML IJ SOLN
INTRAMUSCULAR | Status: AC
  Filled 2016-02-26: qty 1

## 2016-02-26 MED ORDER — SCOPOLAMINE 1 MG/3DAYS TD PT72
MEDICATED_PATCH | TRANSDERMAL | Status: DC | PRN
  Administered 2016-02-26: 1 via TRANSDERMAL

## 2016-02-26 MED ORDER — ONDANSETRON HCL 4 MG/2ML IJ SOLN
INTRAMUSCULAR | Status: DC | PRN
  Administered 2016-02-26: 4 mg via INTRAVENOUS

## 2016-02-26 MED ORDER — TERBUTALINE SULFATE 1 MG/ML IJ SOLN
INTRAMUSCULAR | Status: AC
Start: 2016-02-26 — End: 2016-02-27
  Filled 2016-02-26: qty 1

## 2016-02-26 MED ORDER — OXYTOCIN 10 UNIT/ML IJ SOLN
INTRAMUSCULAR | Status: AC
  Filled 2016-02-26: qty 4

## 2016-02-26 MED ORDER — FENTANYL CITRATE (PF) 250 MCG/5ML IJ SOLN
INTRAMUSCULAR | Status: AC
  Filled 2016-02-26: qty 10

## 2016-02-26 MED ORDER — DEXAMETHASONE SODIUM PHOSPHATE 10 MG/ML IJ SOLN
INTRAMUSCULAR | Status: AC
Start: 2016-02-26 — End: 2016-02-26
  Filled 2016-02-26: qty 1

## 2016-02-26 MED ORDER — CEFAZOLIN SODIUM-DEXTROSE 2-3 GM-% IV SOLR
INTRAVENOUS | Status: AC
  Filled 2016-02-26: qty 50

## 2016-02-26 MED ORDER — LACTATED RINGERS IV SOLN
INTRAVENOUS | Status: DC | PRN
  Administered 2016-02-26 (×2): via INTRAVENOUS

## 2016-02-26 MED ORDER — SCOPOLAMINE 1 MG/3DAYS TD PT72
MEDICATED_PATCH | TRANSDERMAL | Status: AC
  Filled 2016-02-26: qty 2

## 2016-02-26 MED ORDER — DEXAMETHASONE SODIUM PHOSPHATE 10 MG/ML IJ SOLN
INTRAMUSCULAR | Status: DC | PRN
  Administered 2016-02-26: 10 mg via INTRAVENOUS

## 2016-02-26 MED ORDER — CEFAZOLIN SODIUM-DEXTROSE 2-3 GM-% IV SOLR
2.0000 g | INTRAVENOUS | Status: AC
  Administered 2016-02-26: 2 g via INTRAVENOUS
  Filled 2016-02-26: qty 50

## 2016-02-26 MED ORDER — LIDOCAINE HCL (CARDIAC) 20 MG/ML IV SOLN
INTRAVENOUS | Status: AC
  Filled 2016-02-26: qty 5

## 2016-02-26 MED ORDER — PROMETHAZINE HCL 25 MG/ML IJ SOLN: 6.2500 mg | INTRAMUSCULAR | Status: DC | PRN

## 2016-02-26 SURGICAL SUPPLY — 35 items
CLAMP CORD UMBIL (MISCELLANEOUS) IMPLANT
CLOTH BEACON ORANGE TIMEOUT ST (SAFETY) ×3 IMPLANT
CONTAINER PREFILL 10% NBF 15ML (MISCELLANEOUS) ×6 IMPLANT
DRSG OPSITE POSTOP 4X10 (GAUZE/BANDAGES/DRESSINGS) ×3 IMPLANT
DURAPREP 26ML APPLICATOR (WOUND CARE) ×3 IMPLANT
ELECT REM PT RETURN 9FT ADLT (ELECTROSURGICAL) ×3
ELECTRODE REM PT RTRN 9FT ADLT (ELECTROSURGICAL) ×1 IMPLANT
EXTRACTOR VACUUM M CUP 4 TUBE (SUCTIONS) IMPLANT
EXTRACTOR VACUUM M CUP 4' TUBE (SUCTIONS)
GLOVE BIOGEL PI IND STRL 7.0 (GLOVE) ×4 IMPLANT
GLOVE BIOGEL PI INDICATOR 7.0 (GLOVE) ×8
GLOVE ECLIPSE 7.0 STRL STRAW (GLOVE) ×3 IMPLANT
GLOVE SURG SS PI 7.0 STRL IVOR (GLOVE) ×3 IMPLANT
GOWN STRL REUS W/TWL LRG LVL3 (GOWN DISPOSABLE) ×6 IMPLANT
KIT ABG SYR 3ML LUER SLIP (SYRINGE) IMPLANT
NEEDLE HYPO 22GX1.5 SAFETY (NEEDLE) ×3 IMPLANT
NEEDLE HYPO 25X5/8 SAFETYGLIDE (NEEDLE) ×3 IMPLANT
NS IRRIG 1000ML POUR BTL (IV SOLUTION) ×3 IMPLANT
PACK C SECTION WH (CUSTOM PROCEDURE TRAY) ×3 IMPLANT
PAD ABD 7.5X8 STRL (GAUZE/BANDAGES/DRESSINGS) ×3 IMPLANT
PAD OB MATERNITY 4.3X12.25 (PERSONAL CARE ITEMS) ×3 IMPLANT
PENCIL SMOKE EVAC W/HOLSTER (ELECTROSURGICAL) ×3 IMPLANT
RETRACTOR TRAXI PANNICULUS (MISCELLANEOUS) ×1 IMPLANT
RTRCTR C-SECT PINK 25CM LRG (MISCELLANEOUS) IMPLANT
STAPLER VISISTAT 35W (STAPLE) ×3 IMPLANT
SUT PDS AB 0 CTX 36 PDP370T (SUTURE) ×3 IMPLANT
SUT PLAIN 2 0 XLH (SUTURE) IMPLANT
SUT VIC AB 0 CTX 36 (SUTURE) ×8
SUT VIC AB 0 CTX36XBRD ANBCTRL (SUTURE) ×4 IMPLANT
SUT VIC AB 4-0 KS 27 (SUTURE) ×3 IMPLANT
SYR CONTROL 10ML LL (SYRINGE) ×3 IMPLANT
TOWEL OR 17X24 6PK STRL BLUE (TOWEL DISPOSABLE) ×3 IMPLANT
TRAXI PANNICULUS RETRACTOR (MISCELLANEOUS) ×2
TRAY FOLEY CATH SILVER 14FR (SET/KITS/TRAYS/PACK) ×3 IMPLANT
YANKAUER SUCT BULB TIP NO VENT (SUCTIONS) ×3 IMPLANT

## 2016-02-26 NOTE — Anesthesia Postprocedure Evaluation (Signed)
Anesthesia Post Note  Patient: Courtney [name]  Procedure(s) Performed: Procedure(s) (LRB): CESAREAN SECTION WITH BILATERAL TUBAL LIGATION  (N/A)  Patient location during evaluation: PACU Anesthesia Type: General Level of consciousness: awake and alert Pain management: pain level controlled Vital Signs Assessment: post-procedure vital signs reviewed and stable Respiratory status: spontaneous breathing, nonlabored ventilation, respiratory function stable and patient connected to nasal cannula oxygen Cardiovascular status: blood pressure returned to baseline and stable Postop Assessment: no signs of nausea or vomiting Anesthetic complications: no    Last Vitals:  Filed Vitals:   02/26/16 2300 02/26/16 2315  BP: 130/78 130/86  Pulse: 69 81  Temp:    Resp: 12 17    Last Pain:  Filed Vitals:   02/26/16 2325  PainSc: 4                  Danijah Noh J

## 2016-02-26 NOTE — Progress Notes (Signed)
Subjective:    Courtney [name] is a 35 y.o. female being seen today for her obstetrical visit. She is at [redacted]w[redacted]d gestation. Patient reports occasional contractions. Fetal movement: normal.  Problem List Items Addressed This Visit    None    Visit Diagnoses    Encounter for supervision of other normal pregnancy in third trimester    -  Primary    Relevant Orders    POCT urinalysis dipstick      Patient Active Problem List   Diagnosis Date Noted  . Von Willebrand disease type IA ([place]) 02/04/2016  . Supervision of high risk pregnancy in third trimester 02/04/2016  . Cervical shortening affecting pregnancy in third trimester, antepartum 11/23/2015  . Advanced maternal age in multigravida   . Essential hypertension 03/21/2015  . Depressive disorder, not elsewhere classified 09/06/2013  . Cesarean delivery delivered 08/20/2013  . Excessive fetal growth affecting management of mother, antepartum 07/27/2013  . Rh negative state in antepartum period 05/23/2013  . Normal IUP (intrauterine pregnancy) on prenatal ultrasound 12/16/2012  . Uterine fibroid in pregnancy 12/16/2012  . Vaginal bleeding in pregnancy 12/16/2012  . Subchorionic hemorrhage 12/16/2012    Objective:    BP 117/75 mmHg  Pulse 93  Wt 218 lb (98.884 kg)  LMP 05/31/2015 (Exact Date) FHT: 150 BPM  Uterine Size: size greater than dates  Presentations: unsure    Assessment:    Pregnancy @ [redacted]w[redacted]d weeks   Plan:   Plans for delivery: C/Section scheduled; labs reviewed; problem list updated Counseling: Consent signed. Infant feeding: plans to breastfeed. Cigarette smoking: never smoked. L&D discussion: symptoms of labor, discussed when to call, discussed what number to call, anesthetic/analgesic options reviewed and delivering clinician:  plans Physician. Postpartum supports and preparation: circumcision discussed and contraception plans discussed.

## 2016-02-26 NOTE — Transfer of Care (Signed)
Immediate Anesthesia Transfer of Care Note  Patient: Courtney [name]  Procedure(s) Performed: Procedure(s): CESAREAN SECTION WITH BILATERAL TUBAL LIGATION  (N/A)  Patient Location: PACU  Anesthesia Type:General  Level of Consciousness: awake, alert , oriented and patient cooperative  Airway & Oxygen Therapy: Patient Spontanous Breathing and Patient connected to nasal cannula oxygen  Post-op Assessment: Report given to RN, Post -op Vital signs reviewed and stable and Patient moving all extremities X 4  Post vital signs: Reviewed and stable  Last Vitals:  Filed Vitals:   02/26/16 2040  BP: 119/74  Pulse: 100  Temp: 36.4 C  Resp: 20    Complications: No apparent anesthesia complications

## 2016-02-26 NOTE — Anesthesia Preprocedure Evaluation (Addendum)
Anesthesia Evaluation  Patient identified by MRN, date of birth, ID band Patient awake    Reviewed: Allergy & Precautions, NPO status , Patient's Chart, lab work & pertinent test results  Airway Mallampati: II  TM Distance: >3 FB Neck ROM: Full    Dental no notable dental hx.    Pulmonary neg pulmonary ROS,    Pulmonary exam normal breath sounds clear to auscultation       Cardiovascular hypertension, Pt. on medications negative cardio ROS Normal cardiovascular exam Rhythm:Regular Rate:Normal     Neuro/Psych PSYCHIATRIC DISORDERS Depression negative neurological ROS     GI/Hepatic negative GI ROS, Neg liver ROS,   Endo/Other  negative endocrine ROS  Renal/GU negative Renal ROS  negative genitourinary   Musculoskeletal negative musculoskeletal ROS (+)   Abdominal   Peds negative pediatric ROS (+)  Hematology H/O Von Willebrand type IA  Consult by Dr. Beryle Beams Feb. 20, 2017 reviewed. Dr. Jodi Mourning spoke with Dr. Beryle Beams today and no extra medicines need to be given per Dr. Beryle Beams for C-Section.   Anesthesia Other Findings   Reproductive/Obstetrics negative OB ROS                            Anesthesia Physical Anesthesia Plan  ASA: III and emergent  Anesthesia Plan: General   Post-op Pain Management:    Induction: Intravenous  Airway Management Planned: Oral ETT  Additional Equipment:   Intra-op Plan:   Post-operative Plan: Extubation in OR  Informed Consent: I have reviewed the patients History and Physical, chart, labs and discussed the procedure including the risks, benefits and alternatives for the proposed anesthesia with the patient or authorized representative who has indicated his/her understanding and acceptance.   Dental advisory given  Plan Discussed with: CRNA  Anesthesia Plan Comments:        Anesthesia Quick Evaluation

## 2016-02-26 NOTE — Anesthesia Procedure Notes (Signed)
Procedure Name: Intubation Date/Time: 02/26/2016 9:21 PM Performed by: Gilmer Mor R Pre-anesthesia Checklist: Patient identified, Emergency Drugs available, Suction available, Patient being monitored and Timeout performed Patient Re-evaluated:Patient Re-evaluated prior to inductionOxygen Delivery Method: Circle System Utilized and Circle system utilized Preoxygenation: Pre-oxygenation with 100% oxygen Intubation Type: IV induction, Rapid sequence and Cricoid Pressure applied Ventilation: Mask ventilation without difficulty Laryngoscope Size: Mac and 3 Grade View: Grade II Tube type: Oral Tube size: 7.0 mm Number of attempts: 1 Airway Equipment and Method: stylet Placement Confirmation: ETT inserted through vocal cords under direct vision,  positive ETCO2 and breath sounds checked- equal and bilateral Secured at: 21 cm Tube secured with: Tape Dental Injury: Teeth and Oropharynx as per pre-operative assessment

## 2016-02-26 NOTE — Addendum Note (Signed)
Addended by: Lewie Loron D on: 02/26/2016 05:05 PM   Modules accepted: Orders

## 2016-02-26 NOTE — Op Note (Signed)
Cesarean Section Procedure Note   Courtney [name]   02/26/2016  Indications: 38.5 weeks.  Active labor.  Previous C/S.  Desired repeat C/S and BTL..   Pre-operative Diagnosis: CESAREAN SECTION WITH BILATERAL TUBAL LIGATION ELECTIVE REPEAT, SECOND STAGE LABOR, UNDESIRED FERTILITY, DECLINED TOLAC .   Post-operative Diagnosis: Same    Procedure:  Repeat Low Transverse Cesarean Section and Bilateral Partial Salpingectomy  Surgeon: HARPER,CHARLES A  Assistants: Surgical Technician  Anesthesia: general  Procedure Details:  The patient was seen in the Holding Room. The risks, benefits, complications, treatment options, and expected outcomes were discussed with the patient. The patient concurred with the proposed plan, giving informed consent. The patient was identified as Radiographer, therapeutic and the procedure verified as C-Section Delivery. A Time Out was held and the above information confirmed.  After induction of anesthesia, the patient was draped and prepped in the usual sterile manner. A transverse incision was made and carried down through the subcutaneous tissue to the fascia. The fascial incision was made and extended transversely. The fascia was separated from the underlying rectus tissue superiorly and inferiorly. The peritoneum was identified and entered. The peritoneal incision was extended longitudinally. The utero-vesical peritoneal reflection was incised transversely and the bladder flap was bluntly freed from the lower uterine segment. A low transverse uterine incision was made. Delivered from cephalic presentation was a 3570 gram living newborn female infant(s). APGAR (1 MIN): 8   APGAR (5 MINS): 9   APGAR (10 MINS):    A cord ph was not sent. The umbilical cord was clamped and cut cord. A sample of cord blood was obtained for evaluation. The placenta was removed Intact and appeared normal.  The uterine incision was closed with running locked sutures of 0 Monocryl. A second  imbricating layer of the same suture was placed.  Hemostasis was observed.  Attention turned above to tubal ligation.  The right fallopian tube grasped in isthmic area with Babcock clamp.  The tube identified from corneal to fimbrial ends.  Knuckle of tube beneath the clamp was suture ligated through the mesosalpinx with 2-0 Plain Catgut.  The knuckle of tube was excised above the knot and submitted to pathology.  The tubal stumps were hemostatic and were placed back in their normal anatomic position.  The same procedure was performed on the opposite side without complications. The paracolic gutters were irrigated. The parieto peritoneum was closed in a running fashion with 2-0 Vicryl.  The fascia was then reapproximated with running sutures of 0 Vicryl.  The skin was closed with staples.  Instrument, sponge, and needle counts were correct prior the abdominal closure and were correct at the conclusion of the case.    Findings: Normal uterus, ovaries and tubes   Estimated Blood Loss: 897ml  Total IV Fluids: 2532ml   Urine Output: 300CC OF pink colored urine  Specimens: @ORSPECIMEN @   Complications: no complications  Disposition: PACU - hemodynamically stable.  Maternal Condition: stable   Baby condition / location:  Couplet care / Skin to Skin    Signed: Surgeon(s): Shelly Bombard, MD

## 2016-02-26 NOTE — H&P (Signed)
Courtney [name] is a 35 y.o. female presenting for UC's. Maternal Medical History:  Reason for admission: Contractions.   Prenatal complications: Von Willebrand's     OB History    Gravida Para Term Preterm AB TAB SAB Ectopic Multiple Living   2 1 1       1      Past Medical History  Diagnosis Date  . Hypertension   . Von Willebrand disease ([place])   . Allergy   . UTI (lower urinary tract infection)   . Von Willebrand disease type IA ([place]) 02/04/2016  . Supervision of high risk pregnancy in third trimester 02/04/2016   Past Surgical History  Procedure Laterality Date  . Hemmeroid    . Gynecologic cryosurgery    . Cesarean section N/A 08/17/2013    Procedure: Primary Cesarean Section Delivery Baby Boy @ [id number], Apgars 9/10 ;  Surgeon: Lahoma Crocker, MD;  Location: [facility];  Service: Obstetrics;  Laterality: N/A;   Family History: family history includes Cancer in her maternal grandfather and paternal grandfather; Diabetes in her maternal grandmother and paternal grandmother; Emphysema in her maternal grandfather; Hypertension in her maternal grandmother and paternal grandmother. Social History:  reports that she has never smoked. She has never used smokeless tobacco. She reports that she does not drink alcohol or use illicit drugs.   Prenatal Transfer Tool  Maternal Diabetes: No Genetic Screening: Normal Maternal Ultrasounds/Referrals: Normal Fetal Ultrasounds or other Referrals:  None Maternal Substance Abuse:  No Significant Maternal Medications:  None Significant Maternal Lab Results:  None Other Comments:  None  Review of Systems  All other systems reviewed and are negative.   Dilation: 10 (bulging membranes) Effacement (%): 100 Exam by:: Dr. Harolyn Rutherford Blood pressure 119/74, pulse 100, temperature 97.6 F (36.4 C), resp. rate 20, last menstrual period 05/31/2015, currently breastfeeding. Maternal Exam:  Uterine Assessment: Contraction strength is moderate.   Contraction frequency is regular.   Abdomen: Patient reports no abdominal tenderness. Fetal presentation: vertex  Introitus: Normal vulva. Normal vagina.  Cervix: Cervix evaluated by digital exam.     Physical Exam  Nursing note and vitals reviewed. Constitutional: She is oriented to person, place, and time. She appears well-developed and well-nourished.  HENT:  Head: Normocephalic and atraumatic.  Eyes: Conjunctivae are normal. Pupils are equal, round, and reactive to light.  Neck: Normal range of motion. Neck supple.  Cardiovascular: Normal rate and regular rhythm.   Respiratory: Effort normal and breath sounds normal.  GI: Soft. Bowel sounds are normal.  Genitourinary: Vagina normal and uterus normal.  Musculoskeletal: Normal range of motion.  Neurological: She is alert and oriented to person, place, and time.  Skin: Skin is warm and dry.  Psychiatric: She has a normal mood and affect. Her behavior is normal. Judgment and thought content normal.    Prenatal labs: ABO, Rh: --/--/A NEG (12/09 1800) Antibody: NEG (12/09 1800) Rubella: 1.93 (09/27 1633) RPR: NON REAC (12/27 1132)  HBsAg: NEGATIVE (09/27 1633)  HIV: NONREACTIVE (12/27 1132)  GBS:     Assessment/Plan: 38 weeks.  Active labor.  Refuses TOLAC.  Desires tubal ligation.  Admit for repeat C/S.   HARPER,CHARLES A 02/26/2016, 9:01 PM

## 2016-02-26 NOTE — Patient Instructions (Signed)
Your procedure is scheduled on:  Friday, February 29, 2016  Enter through the Main Entrance of [facility] at: 9:00 AM  Pick up the phone at the desk and dial [phone].  Call this number if you have problems the morning of surgery: [phone].  Remember: Do NOT eat food or drink after:  Midnight Thursday  Take these medicines the morning of surgery with a SIP OF WATER:  Labetalol  Do NOT wear jewelry (body piercing), metal hair clips/bobby pins, or nail polish. Do NOT wear lotions, powders, or perfumes.  You may wear deoderant. Do NOT shave for 48 hours prior to surgery. Do NOT bring valuables to the hospital.  Leave suitcase in car.  After surgery it may be brought to your room.  For patients admitted to the hospital, checkout time is 11:00 AM the day of discharge.

## 2016-02-27 ENCOUNTER — Encounter (HOSPITAL_COMMUNITY): Payer: Self-pay | Admitting: *Deleted

## 2016-02-27 ENCOUNTER — Inpatient Hospital Stay (HOSPITAL_COMMUNITY): Payer: 59

## 2016-02-27 ENCOUNTER — Inpatient Hospital Stay (HOSPITAL_COMMUNITY)
Admission: RE | Admit: 2016-02-27 | Discharge: 2016-02-27 | Disposition: A | Discharge status: Home / Self Care | Payer: 59 | Source: Ambulatory Visit

## 2016-02-27 DIAGNOSIS — Z98891 History of uterine scar from previous surgery: Secondary | ICD-10-CM

## 2016-02-27 DIAGNOSIS — D6801 Von willebrand disease, type 1: Secondary | ICD-10-CM | POA: Insufficient documentation

## 2016-02-27 DIAGNOSIS — D696 Thrombocytopenia, unspecified: Secondary | ICD-10-CM | POA: Insufficient documentation

## 2016-02-27 DIAGNOSIS — O99119 Other diseases of the blood and blood-forming organs and certain disorders involving the immune mechanism complicating pregnancy, unspecified trimester: Secondary | ICD-10-CM | POA: Insufficient documentation

## 2016-02-27 DIAGNOSIS — O9913 Other diseases of the blood and blood-forming organs and certain disorders involving the immune mechanism complicating the puerperium: Secondary | ICD-10-CM

## 2016-02-27 DIAGNOSIS — D680 Von Willebrand's disease: Secondary | ICD-10-CM

## 2016-02-27 DIAGNOSIS — R310 Gross hematuria: Secondary | ICD-10-CM | POA: Insufficient documentation

## 2016-02-27 LAB — URINALYSIS, ROUTINE W REFLEX MICROSCOPIC
Bilirubin Urine: NEGATIVE
GLUCOSE, UA: NEGATIVE mg/dL
Ketones, ur: 15 mg/dL — AB
Nitrite: POSITIVE — AB
PROTEIN: 100 mg/dL — AB
pH: 6.5 (ref 5.0–8.0)

## 2016-02-27 LAB — CBC WITH DIFFERENTIAL/PLATELET
Basophils Absolute: 0 10*3/uL (ref 0.0–0.1)
Basophils Relative: 0 %
EOS PCT: 0 %
Eosinophils Absolute: 0 10*3/uL (ref 0.0–0.7)
HEMATOCRIT: 29.1 % — AB (ref 36.0–46.0)
Hemoglobin: 9.8 g/dL — ABNORMAL LOW (ref 12.0–15.0)
LYMPHS ABS: 2.2 10*3/uL (ref 0.7–4.0)
LYMPHS PCT: 18 %
MCH: 29.2 pg (ref 26.0–34.0)
MCHC: 33.7 g/dL (ref 30.0–36.0)
MCV: 86.6 fL (ref 78.0–100.0)
MONO ABS: 0.8 10*3/uL (ref 0.1–1.0)
MONOS PCT: 7 %
NEUTROS ABS: 9.2 10*3/uL — AB (ref 1.7–7.7)
Neutrophils Relative %: 75 %
PLATELETS: 118 10*3/uL — AB (ref 150–400)
RBC: 3.36 MIL/uL — ABNORMAL LOW (ref 3.87–5.11)
RDW: 15.1 % (ref 11.5–15.5)
WBC: 12.2 10*3/uL — ABNORMAL HIGH (ref 4.0–10.5)

## 2016-02-27 LAB — CBC
HCT: 31.3 % — ABNORMAL LOW (ref 36.0–46.0)
Hemoglobin: 10.9 g/dL — ABNORMAL LOW (ref 12.0–15.0)
MCH: 30 pg (ref 26.0–34.0)
MCHC: 34.8 g/dL (ref 30.0–36.0)
MCV: 86.2 fL (ref 78.0–100.0)
Platelets: 118 10*3/uL — ABNORMAL LOW (ref 150–400)
RBC: 3.63 MIL/uL — ABNORMAL LOW (ref 3.87–5.11)
RDW: 15 % (ref 11.5–15.5)
WBC: 15.4 10*3/uL — ABNORMAL HIGH (ref 4.0–10.5)

## 2016-02-27 LAB — COMPREHENSIVE METABOLIC PANEL
ALBUMIN: 2.7 g/dL — AB (ref 3.5–5.0)
ALT: 15 U/L (ref 14–54)
ANION GAP: 7 (ref 5–15)
AST: 34 U/L (ref 15–41)
Alkaline Phosphatase: 84 U/L (ref 38–126)
BILIRUBIN TOTAL: 0.8 mg/dL (ref 0.3–1.2)
BUN: 6 mg/dL (ref 6–20)
CHLORIDE: 103 mmol/L (ref 101–111)
CO2: 25 mmol/L (ref 22–32)
Calcium: 8.3 mg/dL — ABNORMAL LOW (ref 8.9–10.3)
Creatinine, Ser: 0.64 mg/dL (ref 0.44–1.00)
GFR calc Af Amer: >60 mL/min (ref >60)
GFR calc non Af Amer: >60 mL/min (ref >60)
GLUCOSE: 90 mg/dL (ref 65–99)
POTASSIUM: 3.4 mmol/L — AB (ref 3.5–5.1)
Sodium: 135 mmol/L (ref 135–145)
TOTAL PROTEIN: 5.6 g/dL — AB (ref 6.5–8.1)

## 2016-02-27 LAB — DIC (DISSEMINATED INTRAVASCULAR COAGULATION)PANEL
Fibrinogen: 383 mg/dL (ref 204–475)
Prothrombin Time: 14.2 seconds (ref 11.6–15.2)
aPTT: 27 seconds (ref 24–37)

## 2016-02-27 LAB — DIC (DISSEMINATED INTRAVASCULAR COAGULATION) PANEL
D DIMER QUANT: 1.45 ug{FEU}/mL — AB (ref 0.00–0.50)
INR: 1.08 (ref 0.00–1.49)
PLATELETS: 118 10*3/uL — AB (ref 150–400)
SMEAR REVIEW: NONE SEEN

## 2016-02-27 LAB — SAVE SMEAR

## 2016-02-27 LAB — URINE MICROSCOPIC-ADD ON

## 2016-02-27 LAB — RPR: RPR Ser Ql: NONREACTIVE

## 2016-02-27 MED ORDER — DIPHENHYDRAMINE HCL 25 MG PO CAPS: 25.0000 mg | ORAL_CAPSULE | Freq: Four times a day (QID) | ORAL | Status: DC | PRN

## 2016-02-27 MED ORDER — TETANUS-DIPHTH-ACELL PERTUSSIS 5-2.5-18.5 LF-MCG/0.5 IM SUSP: 0.5000 mL | Freq: Once | INTRAMUSCULAR | Status: DC

## 2016-02-27 MED ORDER — SIMETHICONE 80 MG PO CHEW
80.0000 mg | CHEWABLE_TABLET | ORAL | Status: DC
  Administered 2016-02-29: 80 mg via ORAL
  Filled 2016-02-27: qty 1

## 2016-02-27 MED ORDER — SIMETHICONE 80 MG PO CHEW: 80.0000 mg | CHEWABLE_TABLET | ORAL | Status: DC | PRN

## 2016-02-27 MED ORDER — DIPHENHYDRAMINE HCL 12.5 MG/5ML PO ELIX
12.5000 mg | ORAL_SOLUTION | Freq: Four times a day (QID) | ORAL | Status: DC | PRN
  Filled 2016-02-27: qty 5

## 2016-02-27 MED ORDER — MENTHOL 3 MG MT LOZG: 1.0000 | LOZENGE | OROMUCOSAL | Status: DC | PRN

## 2016-02-27 MED ORDER — DIBUCAINE 1 % RE OINT
1.0000 | TOPICAL_OINTMENT | RECTAL | Status: DC | PRN
Start: 2016-02-27 — End: 2016-02-29

## 2016-02-27 MED ORDER — LACTATED RINGERS IV SOLN
INTRAVENOUS | Status: DC
  Administered 2016-02-27 – 2016-02-28 (×2): via INTRAVENOUS

## 2016-02-27 MED ORDER — ONDANSETRON HCL 4 MG/2ML IJ SOLN: 4.0000 mg | Freq: Four times a day (QID) | INTRAMUSCULAR | Status: DC | PRN

## 2016-02-27 MED ORDER — SIMETHICONE 80 MG PO CHEW
80.0000 mg | CHEWABLE_TABLET | Freq: Three times a day (TID) | ORAL | Status: DC
  Administered 2016-02-27 – 2016-02-29 (×7): 80 mg via ORAL
  Filled 2016-02-27 (×7): qty 1

## 2016-02-27 MED ORDER — NALOXONE HCL 0.4 MG/ML IJ SOLN: 0.4000 mg | INTRAMUSCULAR | Status: DC | PRN

## 2016-02-27 MED ORDER — DIPHENHYDRAMINE HCL 50 MG/ML IJ SOLN: 12.5000 mg | Freq: Four times a day (QID) | INTRAMUSCULAR | Status: DC | PRN

## 2016-02-27 MED ORDER — HYDROMORPHONE 1 MG/ML IV SOLN
INTRAVENOUS | Status: DC
  Filled 2016-02-27: qty 25

## 2016-02-27 MED ORDER — ZOLPIDEM TARTRATE 5 MG PO TABS: 5.0000 mg | ORAL_TABLET | Freq: Every evening | ORAL | Status: DC | PRN

## 2016-02-27 MED ORDER — SODIUM CHLORIDE 0.9% FLUSH: 9.0000 mL | INTRAVENOUS | Status: DC | PRN

## 2016-02-27 MED ORDER — OXYTOCIN 10 UNIT/ML IJ SOLN
2.5000 [IU]/h | INTRAMUSCULAR | Status: AC
  Administered 2016-02-27: 2.5 [IU]/h via INTRAVENOUS
  Filled 2016-02-27: qty 10

## 2016-02-27 MED ORDER — WITCH HAZEL-GLYCERIN EX PADS: 1.0000 "application " | MEDICATED_PAD | CUTANEOUS | Status: DC | PRN

## 2016-02-27 MED ORDER — OXYCODONE-ACETAMINOPHEN 5-325 MG PO TABS: 2.0000 | ORAL_TABLET | ORAL | Status: DC | PRN

## 2016-02-27 MED ORDER — SENNOSIDES-DOCUSATE SODIUM 8.6-50 MG PO TABS
2.0000 | ORAL_TABLET | ORAL | Status: DC
  Administered 2016-02-29: 2 via ORAL
  Filled 2016-02-27: qty 2

## 2016-02-27 MED ORDER — LANOLIN HYDROUS EX OINT: 1.0000 "application " | TOPICAL_OINTMENT | CUTANEOUS | Status: DC | PRN

## 2016-02-27 MED ORDER — PRENATAL MULTIVITAMIN CH
1.0000 | ORAL_TABLET | Freq: Every day | ORAL | Status: DC
  Administered 2016-02-27 – 2016-02-29 (×3): 1 via ORAL
  Filled 2016-02-27 (×3): qty 1

## 2016-02-27 MED ORDER — OXYCODONE-ACETAMINOPHEN 5-325 MG PO TABS
1.0000 | ORAL_TABLET | ORAL | Status: DC | PRN
  Administered 2016-02-28 – 2016-02-29 (×3): 1 via ORAL
  Filled 2016-02-27 (×3): qty 1

## 2016-02-27 MED ORDER — IOHEXOL 300 MG/ML  SOLN: 50.0000 mL | Freq: Once | INTRAMUSCULAR | Status: DC | PRN

## 2016-02-27 MED ORDER — ACETAMINOPHEN 325 MG PO TABS
650.0000 mg | ORAL_TABLET | ORAL | Status: DC | PRN
  Administered 2016-02-27 – 2016-02-28 (×3): 650 mg via ORAL
  Filled 2016-02-27 (×3): qty 2

## 2016-02-27 NOTE — Consult Note (Signed)
Referring MD: Baltazar Najjar  PCP:  Mauricio Po, [place]   Reason for Referral: peri-partum hematuria in a [facility] patient  Chief Complaint  Patient presents with  . Contractions    HPI:  Courtney [name] 35 year old woman known to me from recent office consultation done on 02/04/2016. She was at term of her second pregnancy. She has type I von Willebrand's disease. She is also Rh-. She had no postpartum bleeding complications after delivery of her first child by urgent C-section for failure to descend at full term. Lab studies obtained at time of recent visit consistent with the gestational thrombocytopenia of pregnancy with platelet count 131,000. Hemoglobin 12.3. She had the expected elevation of her factor VIII 141% of control with reference range 57-163 percent and von Willebrand factor activity 65% of control with reference range 50-200 percent.  Arrangements were made to have humate P clotting factor concentrate available if she had bleeding complications peripartum. She had a planned C-section for March 17 but went into labor yesterday March 14. C-section was performed without any excessive bleeding. However she has developed postpartum hematuria. Per my discussion with her OB, there was no obvious trauma to her ladder at time of the surgery.  On exam her wound appears to be dry. She is not having any excessive vaginal bleeding. At present there is blood tinged urine in the line bleeding from her bladder to the Foley catheter bag and it appears that the bleeding is already subsiding. Blood count this morning showed a dip in her platelet count to 118,000. Hemoglobin 10.9 down from 13.1 on March 14.   Past Medical History  Diagnosis Date  . Hypertension   . Von Willebrand disease ([place])   . Allergy   . UTI (lower urinary tract infection)   . Von Willebrand disease type IA ([place]) 02/04/2016  . Supervision of high risk pregnancy in third trimester 02/04/2016  :  Past Surgical History   Procedure Laterality Date  . Hemmeroid    . Gynecologic cryosurgery    . Cesarean section N/A 08/17/2013    Procedure: Primary Cesarean Section Delivery Baby Boy @ [id number], Apgars 9/10 ;  Surgeon: Lahoma Crocker, MD;  Location: [facility];  Service: Obstetrics;  Laterality: N/A;  . Cesarean section N/A 02/26/2016    Procedure: CESAREAN SECTION WITH BILATERAL TUBAL LIGATION ;  Surgeon: Shelly Bombard, MD;  Location: [facility];  Service: Obstetrics;  Laterality: N/A;  :  . HYDROmorphone   Intravenous 6 times per day  . prenatal multivitamin  1 tablet Oral Q1200  . senna-docusate  2 tablet Oral Q24H  . simethicone  80 mg Oral TID PC  . simethicone  80 mg Oral Q24H  . Tdap  0.5 mL Intramuscular Once  :  No Known Allergies:  Family History  Problem Relation Age of Onset  . Diabetes Maternal Grandmother   . Hypertension Maternal Grandmother   . Cancer Maternal Grandfather   . Emphysema Maternal Grandfather   . Diabetes Paternal Grandmother   . Hypertension Paternal Grandmother   . Cancer Paternal Grandfather   :  Social History   Social History  . Marital Status: Married    Spouse Name: N/A  . Number of Children: 1  . Years of Education: 16   Occupational History  . Customer service rep    Social History Main Topics  . Smoking status: Never Smoker   . Smokeless tobacco: Never Used  . Alcohol Use: No  . Drug Use: No  . Sexual  Activity:    Partners: Male    Museum/gallery curator: , None   Other Topics Concern  . Not on file   Social History Narrative   Fun: play with her child   Denies religious beliefs that would effect health care.   :    Vitals: Filed Vitals:   02/27/16 1411 02/27/16 1458  BP:  140/72  Pulse:  98  Temp:  98.8 F (37.1 C)  Resp: 16 17    PHYSICAL EXAM: General appearance:She is alert, awake, oriented, and nursing her new baby boy Lungs are clear Regular cardiac rhythm GI: Soft, nontender, and a steel incision overlying clear dressing  with no oozing of the wound.  GU: Blood-tinged urine in Foley bag which appears to be clearing in the line from her urethra to the bag. Neurologic: No gross focal deficits. Skin: No rash or bruising  Labs:   Recent Labs  02/26/16 2045 02/27/16 0601  WBC 10.7* 15.4*  HGB 13.1 10.9*  HCT 38.2 31.3*  PLT 134* 118*   No results for input(s): NA, K, CL, CO2, GLUCOSE, BUN, CREATININE, CALCIUM in the last 72 hours.  Blood smear review: pending  Images Studies/Results:  No results found.   Impression: Peripartum idiopathic hematuria in a type I von Willebrand's patient.  Bleeding appears to be self-limited and localized to the bladder. No clinical evidence for significant bleeding related to her von Willebrand's and her factor levels should still be significantly elevated from the pregnancy to protect her. Urine appears to be clearing already. She has been evaluated by urology. A CT cystogram is going to be done this evening to rule out bladder injury.   Recommendation: I do not feel that we need to administer factor concentrate at this time since bleeding appears to be subsiding and is localized. If bleeding recurs but remains low-grade I would treat with a dose of DDAVP 0.3 mcg/kg in 100 mL normal saline IV over 30 minutes which should provide good hemostasis if, in fact, her von Willebrand factor and factor VIII levels have fallen faster than expected. I would reserve factor concentrate for severe bleeding. I will go ahead and get baseline PT PTT and fibrinogen.   Murriel Hopper, MD, [place]  Hematology-Oncology/Internal Medicine [phone] 02/27/2016, 4:38 PM

## 2016-02-27 NOTE — Progress Notes (Signed)
Subjective: Postpartum Day 1: Cesarean Delivery Patient reports incisional pain, tolerating PO and + flatus.    Objective: Vital signs in last 24 hours: Temp:  [97.3 F (36.3 C)-98.6 F (37 C)] 98.6 F (37 C) (03/15 1230) Pulse Rate:  [65-100] 89 (03/15 1230) Resp:  [11-20] 14 (03/15 1230) BP: (103-133)/(55-86) 103/55 mmHg (03/15 1230) SpO2:  [96 %-100 %] 97 % (03/15 1230) Weight:  [218 lb (98.884 kg)] 218 lb (98.884 kg) (03/14 1435)  Physical Exam:  General: alert and no distress Lochia: appropriate Uterine Fundus: firm Incision: healing well DVT Evaluation: No evidence of DVT seen on physical exam. Foley Drainage:  Gross hematuria.   Recent Labs  02/26/16 2045 02/27/16 0601  HGB 13.1 10.9*  HCT 38.2 31.3*    Assessment/Plan: Status post Cesarean section.  Gross hematuria.  H/H stable.  H/O von Willebrand's Disease.  Will get Urology Consultation.    Hailie Searight A 02/27/2016, 1:08 PM

## 2016-02-27 NOTE — Addendum Note (Signed)
Addendum  created 02/27/16 1006 by Riki Sheer, CRNA   Modules edited: Clinical Notes   Clinical Notes:  File: [id number]

## 2016-02-27 NOTE — Progress Notes (Signed)
Per Dr. Jodi Mourning, okay to D/C PCA.

## 2016-02-27 NOTE — Anesthesia Postprocedure Evaluation (Signed)
Anesthesia Post Note  Patient: [name]  Procedure(s) Performed: Procedure(s) (LRB): CESAREAN SECTION WITH BILATERAL TUBAL LIGATION  (N/A)  Patient location during evaluation: Mother Baby Anesthesia Type: Epidural Level of consciousness: awake and alert Pain management: pain level controlled Vital Signs Assessment: post-procedure vital signs reviewed and stable Respiratory status: spontaneous breathing, nonlabored ventilation and respiratory function stable Cardiovascular status: stable Postop Assessment: no headache, no backache and epidural receding Anesthetic complications: no    Last Vitals:  Filed Vitals:   02/27/16 0320 02/27/16 0830  BP: 124/76 133/66  Pulse: 83 96  Temp: 36.8 C 36.9 C  Resp: 17 15    Last Pain:  Filed Vitals:   02/27/16 0906  PainSc: 2                  Zayven Powe

## 2016-02-27 NOTE — Lactation Note (Signed)
This note was copied from a baby's chart. Lactation Consultation Note  Patient Name: Courtney [name] [id number] Date: 02/27/2016 Reason for consult: Initial assessment Baby at 79 hr of life and mom is concerned because baby is sleepy. She bf her older child 4 months because of low milk supply that she thinks was from stress and PPD. She would like to avoid low supply and baby loosing "a lot" of wt like her 2 yr did. She stated that he has eaten well 3 times since birth. She can manually express and has seen colostrum bilaterally. This last feeding he had colostrum dried to his lip and running out of his mouth. Discussed baby behavior, feeding frequency, baby belly size, voids, wt loss, breast changes, and nipple care. Given lactation handouts. Aware of OP services and support group.    Maternal Data Has patient been taught Hand Expression?: Yes Does the patient have breastfeeding experience prior to this delivery?: Yes  Feeding Feeding Type: Breast Fed Length of feed: 0 min  LATCH Score/Interventions                      Lactation Tools Discussed/Used WIC Program: No   Consult Status Consult Status: Follow-up Date: 02/28/16 Follow-up type: In-patient    Denzil Hughes 02/27/2016, 1:12 PM

## 2016-02-27 NOTE — Progress Notes (Signed)
CSW received consult due to history of depression and anxiety.   CSW attempted to meet with MOB; however, she presented as tired and exhausted.  CSW followed up with RN, who recommended follow up on 3/16 due to belief that she would be more receptive and easily engaged.

## 2016-02-27 NOTE — Consult Note (Signed)
Urology Consult   Physician requesting consult: Dr. Baltazar Najjar  Reason for consult: Gross hematuria after C-sectin  History of Present Illness: Courtney [name] is a 35 y.o. who delivered her second child via C-section yesterday.  She has a history of Von Willebrand's disease and is followed by Dr. Beryle Beams.  She denies a prior history of hematuria and noted no hematuria prior to her C-section yesterday.  She reportedly had fairly deep red urine earlier which has been gradually clearing throughout the day. No obvious injury of the bladder was noted intraoperatively per Dr. Jacelyn Grip operative note.  She denies flank pain or abdominal pain aside from expected incisional pain.  She denies a history of voiding or storage urinary symptoms, hematuria, UTIs, STDs, urolithiasis, GU malignancy/trauma/surgery.  Past Medical History  Diagnosis Date  . Hypertension   . Von Willebrand disease ([place])   . Allergy   . UTI (lower urinary tract infection)   . Von Willebrand disease type IA ([place]) 02/04/2016  . Supervision of high risk pregnancy in third trimester 02/04/2016    Past Surgical History  Procedure Laterality Date  . Hemmeroid    . Gynecologic cryosurgery    . Cesarean section N/A 08/17/2013    Procedure: Primary Cesarean Section Delivery Baby Boy @ [id number], Apgars 9/10 ;  Surgeon: Lahoma Crocker, MD;  Location: [facility];  Service: Obstetrics;  Laterality: N/A;  . Cesarean section N/A 02/26/2016    Procedure: CESAREAN SECTION WITH BILATERAL TUBAL LIGATION ;  Surgeon: Shelly Bombard, MD;  Location: [facility];  Service: Obstetrics;  Laterality: N/A;     Current Hospital Medications:  Home meds:    Medication List    ASK your doctor about these medications        calcium carbonate 750 MG chewable tablet  Commonly known as:  TUMS EX  Chew 1 tablet by mouth daily as needed for heartburn. Reported on 12/20/2015     FERIVA 21/7 75-1 MG Tabs  Take 1 tablet by mouth daily.     labetalol 200 MG tablet  Commonly known as:  NORMODYNE  TAKE 1 TABLET (200 MG TOTAL) BY MOUTH EVERY 8 (EIGHT) HOURS.     metoCLOPramide 10 MG tablet  Commonly known as:  REGLAN  Take 10 mg by mouth daily as needed (heartburn).     multivitamin-prenatal 27-0.8 MG Tabs tablet  Take 1 tablet by mouth daily at 12 noon.     progesterone 200 MG capsule  Commonly known as:  PROMETRIUM  Insert 1 capsule intravaginally at bedtime.        Scheduled Meds: . HYDROmorphone   Intravenous 6 times per day  . prenatal multivitamin  1 tablet Oral Q1200  . senna-docusate  2 tablet Oral Q24H  . simethicone  80 mg Oral TID PC  . simethicone  80 mg Oral Q24H  . Tdap  0.5 mL Intramuscular Once   Continuous Infusions: . lactated ringers    . oxytocin 2.5 Units/hr (02/27/16 0126)   PRN Meds:.acetaminophen, witch hazel-glycerin **AND** dibucaine, diphenhydrAMINE **OR** diphenhydrAMINE, diphenhydrAMINE, lanolin, menthol-cetylpyridinium, naloxone **AND** sodium chloride flush, ondansetron (ZOFRAN) IV, oxyCODONE-acetaminophen, oxyCODONE-acetaminophen, simethicone, zolpidem  Allergies: No Known Allergies  Family History  Problem Relation Age of Onset  . Diabetes Maternal Grandmother   . Hypertension Maternal Grandmother   . Cancer Maternal Grandfather   . Emphysema Maternal Grandfather   . Diabetes Paternal Grandmother   . Hypertension Paternal Grandmother   . Cancer Paternal Grandfather     Social History:  reports  that she has never smoked. She has never used smokeless tobacco. She reports that she does not drink alcohol or use illicit drugs.  ROS: A complete review of systems was performed.  All systems are negative except for pertinent findings as noted.  Physical Exam:  Vital signs in last 24 hours: Temp:  [97.3 F (36.3 C)-98.8 F (37.1 C)] 98.8 F (37.1 C) (03/15 1458) Pulse Rate:  [65-100] 98 (03/15 1458) Resp:  [11-20] 17 (03/15 1458) BP: (103-140)/(55-86) 140/72 mmHg (03/15  1458) SpO2:  [96 %-100 %] 99 % (03/15 1458) Constitutional:  Alert and oriented, No acute distress Cardiovascular: No JVD Respiratory: Normal respiratory effort GI: Abdomen is soft, nontender, nondistended, incision intact GU: No CVA tenderness, Urine is pinkish and draining well in catheter tubing Lymphatic: No lymphadenopathy Neurologic: Grossly intact, no focal deficits Psychiatric: Normal mood and affect  Laboratory Data:   Recent Labs  02/26/16 2045 02/27/16 0601  WBC 10.7* 15.4*  HGB 13.1 10.9*  HCT 38.2 31.3*  PLT 134* 118*    No results for input(s): NA, K, CL, GLUCOSE, BUN, CALCIUM, CREATININE in the last 72 hours.  Invalid input(s): CO3   Results for orders placed or performed during the hospital encounter of 02/26/16 (from the past 24 hour(s))  Type and screen     Status: None (Preliminary result)   Collection Time: 02/26/16  8:45 PM  Result Value Ref Range   ABO/RH(D) A NEG    Antibody Screen POS    Sample Expiration 02/29/2016    DAT, IgG NEG    Antibody Identification PASSIVELY ACQUIRED ANTI-D    Unit Number [id number]    Blood Component Type RED CELLS,LR    Unit division 00    Status of Unit ALLOCATED    Transfusion Status OK TO TRANSFUSE    Crossmatch Result COMPATIBLE    Unit Number [id number]    Blood Component Type RED CELLS,LR    Unit division 00    Status of Unit ALLOCATED    Transfusion Status OK TO TRANSFUSE    Crossmatch Result COMPATIBLE   CBC with Differential     Status: Abnormal   Collection Time: 02/26/16  8:45 PM  Result Value Ref Range   WBC 10.7 (H) 4.0 - 10.5 K/uL   RBC 4.41 3.87 - 5.11 MIL/uL   Hemoglobin 13.1 12.0 - 15.0 g/dL   HCT 38.2 36.0 - 46.0 %   MCV 86.6 78.0 - 100.0 fL   MCH 29.7 26.0 - 34.0 pg   MCHC 34.3 30.0 - 36.0 g/dL   RDW 15.0 11.5 - 15.5 %   Platelets 134 (L) 150 - 400 K/uL   Neutrophils Relative % 69 %   Neutro Abs 7.4 1.7 - 7.7 K/uL   Lymphocytes Relative 25 %   Lymphs Abs 2.7 0.7 - 4.0 K/uL    Monocytes Relative 5 %   Monocytes Absolute 0.6 0.1 - 1.0 K/uL   Eosinophils Relative 1 %   Eosinophils Absolute 0.1 0.0 - 0.7 K/uL   Basophils Relative 0 %   Basophils Absolute 0.0 0.0 - 0.1 K/uL  RPR     Status: None   Collection Time: 02/26/16  8:45 PM  Result Value Ref Range   RPR Ser Ql Non Reactive Non Reactive  CBC     Status: Abnormal   Collection Time: 02/27/16  6:01 AM  Result Value Ref Range   WBC 15.4 (H) 4.0 - 10.5 K/uL   RBC 3.63 (L) 3.87 - 5.11 MIL/uL  Hemoglobin 10.9 (L) 12.0 - 15.0 g/dL   HCT 31.3 (L) 36.0 - 46.0 %   MCV 86.2 78.0 - 100.0 fL   MCH 30.0 26.0 - 34.0 pg   MCHC 34.8 30.0 - 36.0 g/dL   RDW 15.0 11.5 - 15.5 %   Platelets 118 (L) 150 - 400 K/uL  Urinalysis, Routine w reflex microscopic (not at [facility])     Status: Abnormal   Collection Time: 02/27/16  3:00 PM  Result Value Ref Range   Color, Urine RED (A) YELLOW   APPearance CLEAR CLEAR   Specific Gravity, Urine <1.005 (L) 1.005 - 1.030   pH 6.5 5.0 - 8.0   Glucose, UA NEGATIVE NEGATIVE mg/dL   Hgb urine dipstick LARGE (A) NEGATIVE   Bilirubin Urine NEGATIVE NEGATIVE   Ketones, ur 15 (A) NEGATIVE mg/dL   Protein, ur 100 (A) NEGATIVE mg/dL   Nitrite POSITIVE (A) NEGATIVE   Leukocytes, UA TRACE (A) NEGATIVE  Urine microscopic-add on     Status: Abnormal   Collection Time: 02/27/16  3:00 PM  Result Value Ref Range   Squamous Epithelial / LPF 0-5 (A) NONE SEEN   WBC, UA 0-5 0 - 5 WBC/hpf   RBC / HPF TOO NUMEROUS TO COUNT 0 - 5 RBC/hpf   Bacteria, UA FEW (A) NONE SEEN   Urine-Other URINALYSIS PERFORMED ON SUPERNATANT    No results found for this or any previous visit (from the past 240 hour(s)).  Renal Function: No results for input(s): CREATININE in the last 168 hours. CrCl cannot be calculated (Patient has no serum creatinine result on file.).  Radiologic Imaging: No results found.   Impression/Recommendation: Gross hematuria after C-section with history of Von Willebrand's disease  -  Need to rule out bladder injury prior to catheter removal and will proceed with CT cystogram tonight for further evaluation - If no bladder injury, can likely have catheter removed in the morning (I will see her to assess) if her Hgb is stable and urine continuing to clear with plans to follow up as an outpatient for cystoscopy to rule out any stitch or foreign body in the bladder. - Likelihood of an upper tract source of hematuria (i.e. ureteral injury) is highly unlikely considering procedure  Dario Yono,LES 02/27/2016, 4:04 PM  Pryor Curia. MD   CC: Dr. Baltazar Najjar

## 2016-02-28 ENCOUNTER — Encounter (HOSPITAL_COMMUNITY): Payer: Self-pay | Admitting: Obstetrics

## 2016-02-28 LAB — CBC
HCT: 26.1 % — ABNORMAL LOW (ref 36.0–46.0)
Hemoglobin: 8.9 g/dL — ABNORMAL LOW (ref 12.0–15.0)
MCH: 29.9 pg (ref 26.0–34.0)
MCHC: 34.1 g/dL (ref 30.0–36.0)
MCV: 87.6 fL (ref 78.0–100.0)
Platelets: 124 10*3/uL — ABNORMAL LOW (ref 150–400)
RBC: 2.98 MIL/uL — ABNORMAL LOW (ref 3.87–5.11)
RDW: 15.4 % (ref 11.5–15.5)
WBC: 9.6 10*3/uL (ref 4.0–10.5)

## 2016-02-28 LAB — CULTURE, OB URINE

## 2016-02-28 LAB — MISCELLANEOUS TEST

## 2016-02-28 LAB — BIRTH TISSUE RECOVERY COLLECTION (PLACENTA DONATION)

## 2016-02-28 LAB — URINE CULTURE, OB REFLEX

## 2016-02-28 MED ORDER — NITROFURANTOIN MONOHYD MACRO 100 MG PO CAPS
100.0000 mg | ORAL_CAPSULE | Freq: Two times a day (BID) | ORAL | Status: DC
  Administered 2016-02-28 (×2): 100 mg via ORAL
  Filled 2016-02-28 (×5): qty 1

## 2016-02-28 MED ORDER — MAGNESIUM HYDROXIDE 400 MG/5ML PO SUSP
30.0000 mL | Freq: Every day | ORAL | Status: DC
  Administered 2016-02-28: 30 mL via ORAL
  Filled 2016-02-28: qty 30

## 2016-02-28 NOTE — Clinical Social Work Maternal (Signed)
CLINICAL SOCIAL WORK MATERNAL/CHILD NOTE  Patient Details  Name: Courtney [name] MRN: [id number] Date of Birth: Mar 19, 1981  Date:  02/28/2016  Clinical Social Worker Initiating Note:  Lucita Ferrara MSW, LCSW Date/ Time Initiated:  02/28/16/0945     Child's Name:  Courtney [name]   Legal Guardian:  Bernarda Caffey Venzen-Cherry and Pap Marcelline Mates  Need for Interpreter:  None   Date of Referral:  02/27/16     Reason for Referral:  History of anxiety, depression, and postpartum depression  Referral Source:  [facility]   Address:  [address] [place], [place] [address]  Phone number:  [phone]   Household Members:  Minor Children, Spouse   Natural Supports (not living in the home):  Extended Family, Immediate Family   Professional Supports: None   Employment: Full-time   Type of Work: Estate manager/land agent:    N/A  Museum/gallery curator Resources:  Multimedia programmer   Other Resources:    None identified   Cultural/Religious Considerations Which May Impact Care:  None reported  Strengths:  Ability to meet basic needs , Home prepared for child , Pediatrician chosen    Risk Factors/Current Problems:   1. Mental Health Concerns-- MOB presents with history of postpartum depression.  Cognitive State:  Able to Concentrate , Alert , Goal Oriented , Linear Thinking , Insightful    Mood/Affect:  Happy , Calm , Bright    CSW Assessment:  CSW received request for consult due to history of anxiety, depression, and postpartum depression.  MOB presented as easily engaged and receptive to the visit. FOB was also in the room, but he was on his phone and caring for the infant during the assessment. MOB provided consent for the FOB to remain in the room during the assessment.   MOB shared belief that she is transitioning well postpartum. She stated that she knows what to anticipate and expect secondary to recovery since her first child was born via C-section.  MOB reported that she  is nervous about what to anticipate as she transitions caring for two children since she wants to ensure that she her first son does not feel neglected, unloved, or forgotten.  MOB presented with insight on how to best help support her son as she transitions, and was able to identify what she is also looking forward to.  MOB reported that her mother lives with her and the FOB, and stated that she feels well supported. MOB also identified her siblings as a source of support.   MOB confirmed history of postpartum depression. She was able to identify numerous factors that may have contributed to the symptoms. She discussed how she experienced changes in her birth plan, had an emergency C-section, and then experienced difficulties breastfeeding since the infant had a difficult transition postpartum. She shared that she was tired and overwhelmed as a first time mother, and frequently felt like a failure since she was unable to feed and care for the infant in the ways she wanted to.  Per MOB, she frequently cried in the shower, but denied additional negative behaviors. She stated that she spoke to her medical provider about her feelings, they prescribed an antidepressant, but stated that she never filled the prescription due to improvement in symptoms. She shared that she was able to express her thoughts and feelings to her support system, and they were able to provide her with insight that her thoughts were incomplete and inaccurate. She recognized that she was a good mother, and that she  was doing a good job.   Per MOB, onset of symptoms occurred at approximately 2 weeks postpartum and lasted for 2-3 months. MOB denied any ongoing depression and anxiety once she transitioned out of the postpartum period, and denied mental health complications with onset of this pregnancy.   MOB identified numerous coping skills that she learned that will support her mental health as she currently transitions postpartum. She stated  that she has realized how her own thoughts can be incomplete, and that there is a need to make thoughts more accurate and complete.  MOB recognized that it is okay to ask for help, and that perfection is unrealistic and unattainable.  MOB stated that this birth is also less traumatic, and that the infant is doing better than her son. She discussed even if she needs to supplement with formula, that it will be okay.   MOB was receptive to reviewing education on the baby blues and perinatal mood disorders. MOB acknowledged her increased risk due to prior history, and agreed to contact her medical provider if she notes onset of symptoms. She stated that she currently feels "great", and is looking forward to returning home.  She expressed appreciation for the visit, and agreed to contact CSW if needs arise.  CSW Plan/Description:  Patient/Family Education , No Further Intervention Required/No Barriers to Discharge    Sharyl Nimrod 02/28/2016, 10:22 AM

## 2016-02-28 NOTE — Progress Notes (Signed)
Patient ID: Courtney [name], female   DOB: 1981-10-25, 35 y.o.   MRN: [id number]  2 Days Post-Op Subjective: Pt with no new complaints.  She had one episode of bladder spasms last night.    Objective: Vital signs in last 24 hours: Temp:  [98 F (36.7 C)-98.9 F (37.2 C)] 98 F (36.7 C) (03/16 0530) Pulse Rate:  [85-98] 85 (03/16 0530) Resp:  [14-20] 17 (03/16 0530) BP: (103-140)/(55-72) 129/67 mmHg (03/16 0530) SpO2:  [96 %-99 %] 99 % (03/15 2133)  Intake/Output from previous day: 03/15 0701 - 03/16 0700 In: 990 [P.O.:740] Out: 3525 [Urine:3525] Intake/Output this shift: Total I/O In: -  Out: 2000 [Urine:2000]  Physical Exam:  General: Alert and oriented GU: Catheter draining well, pinkish urine  Lab Results:  Recent Labs  02/26/16 2045 02/27/16 0601 02/27/16 1616  HGB 13.1 10.9* 9.8*  HCT 38.2 31.3* 29.1*   BMET  Recent Labs  02/27/16 1616  NA 135  K 3.4*  CL 103  CO2 25  GLUCOSE 90  BUN 6  CREATININE 0.64  CALCIUM 8.3*     Studies/Results: Ct Pelvis Wo Contrast  02/27/2016  CLINICAL DATA:  Gross hematuria after C-section delivery in a patient with history of von Willebrand's disease. Gross hematuria after surgery which has been gradually clearing. EXAM: CT PELVIS WITHOUT CONTRAST TECHNIQUE: Multidetector CT imaging of the pelvis was performed following the standard protocol without intravenous contrast. 250 cc mixture of saline and Omnipaque 300 was given via the patient's existing Foley catheter prior to scanning. COMPARISON:  None. FINDINGS: Enlarged uterus likely related to the immediate postpartum state and measures 22 cm in craniocaudal length by 10.5 cm in AP diameter by 15.4 cm and coronal diameter. High density material within the endometrial canal is compatible with blood products. Uterine anatomy is not well demonstrated on this uninfused CT scan of the pelvis. There is high attenuation material with gas bubble seen in the myometrium of the  anterior lower uterine segment, consistent with C-section yesterday. The urinary bladder is distended with good opacification of the lumen. There is a tiny amount of non-opacified urine in the nondependent bladder dome with some small volume gas visible in the nondependent bladder lumen, presumably secondary to the presence of a Foley catheter. Dependent filling defects within the bladder lumen are compatible with blood clots. There is an area of irregular soft tissue density associated with the posterior bladder wall, adjacent to the anterior lower uterus (see image 57 series 603). Gas in the lower aspect of the anterior abdominal wall is compatible with recent low transverse incision. There is a small amount of fluid noted in the cul-de-sac. Although the kidneys are incompletely visualized, a 2 mm nonobstructing stone is identified in the lower pole the right kidney. Bone windows reveal no worrisome lytic or sclerotic osseous lesions. IMPRESSION: 1. No evidence for contrast extravasation from the bladder to suggest bladder leak. There is some intraluminal material likely representing blood clot and an area of irregularity along the posterior bladder wall, adjacent to the lower uterine segment, may be related to adherent clot although intrinsic bladder wall irregularity/abnormality/injury could also have this appearance. 2. Enlarged uterus with apparent blood products in the endometrial canal and C-section defect. These imaging features are in keeping with Cesarean section yesterday. 3. Only a tiny amount of intraperitoneal free fluid is evident. Electronically Signed   By: Misty Stanley M.D.   On: 02/27/2016 18:17   I have reviewed her CT cystogram.  No evidence of  leak.  Filling defects in the bladder likely representative of clot.  Assessment/Plan: Gross hematuria:   - Remove catheter this morning.  She likely will pass some more clots/blood.  Would follow Hgb to ensure no ongoing bleeding although this  would be unlikely considering color of urine. - No evidence of urine leak from bladder.  She will need outpatient follow up cystoscopy.  I will arrange that appt for a few weeks from now. - Please call if further questions.   LOS: 2 days   Nakeeta Sebastiani,LES 02/28/2016, 6:57 AM

## 2016-02-28 NOTE — Progress Notes (Signed)
Patient ID: Courtney [name], female   DOB: 12/07/1981, 35 y.o.   MRN: [id number] Hematology: CT cystogram with no filling defects - see GU note & rec Urine still cherry colored but not gross hematuria. PT,PTT, platelet count, fibrinogen, all normal (slightly decreased platelet count - no change from AM value and was mildly decreased during 3rd trimester) Factor VIII activity excellent at 143% Review of peripheral blood film unremarkable - no signs of a hemolytic process. Impression: Peri-partum hematuria Mild Von Willebrand's Dis No evidence of a hemolytic process or bleeding due to Von Willebrand's disease. Rec: Continue observation; remove foley per GU Monitor CBCs    Murriel Hopper, MD, [place]  Hematology-Oncology/Internal Medicine [phone]

## 2016-02-28 NOTE — Progress Notes (Signed)
Pt. Complaining of feeling "full" and having an urge to pee. Foley had drained 100cc of bloody urine but, tube had been dry for about 30 min. Bladder scan estimated between 400-500cc. During scan pt. Urinated around foley. Dr. Jodi Mourning was notified, orders to place new foley. Foley placed and drained 1150 cc of bloody urine.

## 2016-02-28 NOTE — Progress Notes (Signed)
Subjective: Postpartum Day #2: Cesarean Delivery Patient reports incisional pain and tolerating PO.  Has foley catheter in.    Objective: Vital signs in last 24 hours: Temp:  [98 F (36.7 C)-98.9 F (37.2 C)] 98 F (36.7 C) (03/16 0530) Pulse Rate:  [85-98] 85 (03/16 0530) Resp:  [14-20] 17 (03/16 0530) BP: (103-140)/(55-72) 129/67 mmHg (03/16 0530) SpO2:  [96 %-99 %] 99 % (03/15 2133)  Physical Exam:  General: alert, cooperative and no distress Lochia: appropriate Uterine Fundus: firm Incision: no significant drainage DVT Evaluation: No evidence of DVT seen on physical exam. No cords or calf tenderness. Foley: draining red urine.    Recent Labs  02/27/16 1616 02/28/16 0802  HGB 9.8* 8.9*  HCT 29.1* 26.1*    Assessment/Plan: Status post Cesarean section. Postoperative course complicated by bladder irritation, Von Willebrand's  Continue current care.  Hep. Lock IV this AM.  Foley was changed overnight d/t a clot in the line.  Foley is due to be removed this AM.  Bladder CT was negative.  Most likely cause is swelling. Von Willebrand's stable per hematology.     Morene Crocker, CNM 02/28/2016, 8:55 AM

## 2016-02-29 ENCOUNTER — Inpatient Hospital Stay (HOSPITAL_COMMUNITY): Admission: RE | Admit: 2016-02-29 | Payer: 59 | Source: Ambulatory Visit | Admitting: Obstetrics

## 2016-02-29 LAB — TYPE AND SCREEN
ABO/RH(D): A NEG
ANTIBODY SCREEN: POSITIVE
DAT, IgG: NEGATIVE
Unit division: 0
Unit division: 0

## 2016-02-29 SURGERY — Surgical Case
Anesthesia: Regional | Laterality: Bilateral

## 2016-02-29 MED ORDER — TRIAMTERENE-HCTZ 37.5-25 MG PO CAPS: 1.0000 | ORAL_CAPSULE | Freq: Every morning | ORAL | Status: DC

## 2016-02-29 MED ORDER — OXYCODONE-ACETAMINOPHEN 5-325 MG PO TABS: 1.0000 | ORAL_TABLET | ORAL | Status: DC | PRN

## 2016-02-29 MED ORDER — MAGNESIUM HYDROXIDE 400 MG/5ML PO SUSP: 30.0000 mL | Freq: Every evening | ORAL | Status: DC | PRN

## 2016-02-29 NOTE — Lactation Note (Signed)
This note was copied from a baby's chart. Lactation Consultation Note  Mother is concerned about baby getting enough.  Mother is able to hand express.  Some pinkness on nipples.  Has shells. Discussed getting a deep latch. Explained to mother that she needs to breastfeed on both breasts to maximize her milk supply. Reviewed waking techniques. Mom encouraged to feed baby 8-12 times/24 hours and with feeding cues.  Mom made aware of O/P services, breastfeeding support groups, community resources, and our phone # for post-discharge questions.       Patient Name: Courtney [name] [id number] Date: 02/29/2016 Reason for consult: Follow-up assessment   Maternal Data    Feeding Feeding Type: Breast Fed Length of feed: 25 min  LATCH Score/Interventions Latch: Grasps breast easily, tongue down, lips flanged, rhythmical sucking. Intervention(s): Adjust position;Assist with latch  Audible Swallowing: A few with stimulation Intervention(s): Skin to skin Intervention(s): Skin to skin  Type of Nipple: Everted at rest and after stimulation  Comfort (Breast/Nipple): Soft / non-tender     Hold (Positioning): Assistance needed to correctly position infant at breast and maintain latch.  LATCH Score: 8  Lactation Tools Discussed/Used     Consult Status Consult Status: Complete    Carlye Grippe 02/29/2016, 11:07 AM

## 2016-02-29 NOTE — Progress Notes (Signed)
Subjective: Postpartum Day 3: Cesarean Delivery Patient reports incisional pain, tolerating PO, + flatus and no problems voiding.    Objective: Vital signs in last 24 hours: Temp:  [99.2 F (37.3 C)] 99.2 F (37.3 C) (03/17 0510) Pulse Rate:  [90-109] 90 (03/17 0510) Resp:  [18] 18 (03/17 0510) BP: (109-129)/(65-75) 109/75 mmHg (03/17 0510) SpO2:  [99 %] 99 % (03/17 0510)  Physical Exam:  General: alert and no distress Lochia: appropriate Uterine Fundus: firm Incision: healing well DVT Evaluation: No evidence of DVT seen on physical exam.   Recent Labs  02/27/16 1616 02/28/16 0802  HGB 9.8* 8.9*  HCT 29.1* 26.1*    Assessment/Plan: Status post Cesarean section. Doing well postoperatively.  Discharge home with standard precautions and return to clinic in 4 days for removal of staples.  HARPER,CHARLES A 02/29/2016, 8:31 AM

## 2016-02-29 NOTE — Discharge Summary (Signed)
Obstetric Discharge Summary Reason for Admission: onset of labor Prenatal Procedures: ultrasound Intrapartum Procedures: cesarean: low cervical, transverse and tubal ligation Postpartum Procedures: CT scan for post op hematuria Complications-Operative and Postpartum: Hematuria HEMOGLOBIN  Date Value Ref Range Status  02/28/2016 8.9* 12.0 - 15.0 g/dL Final  01/06/2013 14.2 g/dL Final   HCT  Date Value Ref Range Status  02/28/2016 26.1* 36.0 - 46.0 % Final  01/06/2013 42 % Final    Physical Exam:  General: alert and no distress Lochia: appropriate Uterine Fundus: firm Incision: healing well DVT Evaluation: No evidence of DVT seen on physical exam.  Discharge Diagnoses:  S/P Repeat LTCS and BTL                                          Postoperative Hematuria, improved at discharge, with consultations done by Urology and Hematology, with clearance for discharge and outpatient F/U.  CT scan of pelvis negative prior to discharge.                                          Anemia.  Clinically stable.  Iron started.  Discharge Information: Date: 02/29/2016 Activity: pelvic rest Diet: routine Medications: PNV, Colace, Iron and Percocet Condition: stable Instructions: refer to practice specific booklet Discharge to: home Follow-up Information    Follow up with [name], MD. Schedule an appointment as soon as possible for Courtney [name] visit in 4 days.   Specialty:  Obstetrics and Gynecology   Why:  For removal of staples.   Contact information:   [place] [address] [phone]       Newborn Data: Live born female  Birth Weight: 7 lb 13.9 oz (3570 g) APGAR: 8, 9  Home with mother.  Courtney Courtney [name] 02/29/2016, 8:47 AM

## 2016-03-04 ENCOUNTER — Ambulatory Visit (INDEPENDENT_AMBULATORY_CARE_PROVIDER_SITE_OTHER): Payer: 59 | Admitting: Obstetrics

## 2016-03-05 ENCOUNTER — Encounter: Payer: Self-pay | Admitting: Obstetrics

## 2016-03-05 NOTE — Progress Notes (Signed)
Subjective:     Courtney [name] is a 35 y.o. female who presents for a postpartum visit. She is 1 week postpartum following a low cervical transverse Cesarean section. I have fully reviewed the prenatal and intrapartum course. The delivery was at 39 gestational weeks. Outcome: repeat cesarean section, low transverse incision. Anesthesia: General.  Postpartum course has been normal. Baby's course has been normal. Baby is feeding by breast. Bleeding no bleeding. Bowel function is normal. Bladder function is normal. Patient is not sexually active. Contraception method is tubal ligation. Postpartum depression screening: negative.  Tobacco, alcohol and substance abuse history reviewed.  Adult immunizations reviewed including TDAP, rubella and varicella.  The following portions of the patient's history were reviewed and updated as appropriate: allergies, current medications, past family history, past medical history, past social history, past surgical history and problem list.  Review of Systems A comprehensive review of systems was negative.   Objective:    BP 148/82 mmHg  Pulse 93  Wt 196 lb 12.8 oz (89.268 kg)  LMP 05/31/2015 (Exact Date)   PE:      General:  Alert and no distress      Abdomen:  Soft, NT.  Incision C, D, I.  Staples removed and steri strips applied.      Extremities:  No C, C, E.    Assessment:    1 week postpartum.  Doing well.    Plan:    1. Contraception: tubal ligation 2. Continue PNV's 3. Follow up in: 2 weeks or as needed.   Healthy lifestyle practices reviewed

## 2016-03-06 ENCOUNTER — Encounter: Payer: 59 | Admitting: Obstetrics

## 2016-03-07 ENCOUNTER — Encounter: Payer: Self-pay | Admitting: *Deleted

## 2016-03-10 ENCOUNTER — Ambulatory Visit (INDEPENDENT_AMBULATORY_CARE_PROVIDER_SITE_OTHER): Payer: 59 | Admitting: Oncology

## 2016-03-10 ENCOUNTER — Encounter: Payer: Self-pay | Admitting: Oncology

## 2016-03-10 VITALS — BP 130/75 | HR 94 | Temp 98.4°F | Wt 193.3 lb

## 2016-03-10 DIAGNOSIS — D680 Von Willebrand's disease: Secondary | ICD-10-CM

## 2016-03-10 DIAGNOSIS — O99113 Other diseases of the blood and blood-forming organs and certain disorders involving the immune mechanism complicating pregnancy, third trimester: Secondary | ICD-10-CM

## 2016-03-10 DIAGNOSIS — Z98891 History of uterine scar from previous surgery: Secondary | ICD-10-CM | POA: Diagnosis not present

## 2016-03-10 DIAGNOSIS — O0993 Supervision of high risk pregnancy, unspecified, third trimester: Secondary | ICD-10-CM

## 2016-03-10 DIAGNOSIS — D696 Thrombocytopenia, unspecified: Secondary | ICD-10-CM

## 2016-03-10 DIAGNOSIS — R310 Gross hematuria: Secondary | ICD-10-CM

## 2016-03-10 DIAGNOSIS — D6801 Von willebrand disease, type 1: Secondary | ICD-10-CM

## 2016-03-10 NOTE — Patient Instructions (Signed)
To lab today Schedule DDAVP 20 minute infusion with short stay for May 1st; lab at short stay before and 1 hour after infusion Return visit with Dr Darnell Level in 6 months

## 2016-03-11 ENCOUNTER — Telehealth: Payer: Self-pay | Admitting: *Deleted

## 2016-03-11 ENCOUNTER — Other Ambulatory Visit: Payer: Self-pay | Admitting: Oncology

## 2016-03-11 DIAGNOSIS — D6801 Von willebrand disease, type 1: Secondary | ICD-10-CM

## 2016-03-11 DIAGNOSIS — D680 Von Willebrand's disease: Secondary | ICD-10-CM

## 2016-03-11 LAB — CBC WITH DIFFERENTIAL/PLATELET
BASOS: 0 %
Basophils Absolute: 0 10*3/uL (ref 0.0–0.2)
EOS (ABSOLUTE): 0.1 10*3/uL (ref 0.0–0.4)
EOS: 1 %
HEMATOCRIT: 34.3 % (ref 34.0–46.6)
Hemoglobin: 11.3 g/dL (ref 11.1–15.9)
Immature Grans (Abs): 0 10*3/uL (ref 0.0–0.1)
Immature Granulocytes: 0 %
LYMPHS ABS: 1.8 10*3/uL (ref 0.7–3.1)
Lymphs: 21 %
MCH: 28.3 pg (ref 26.6–33.0)
MCHC: 32.9 g/dL (ref 31.5–35.7)
MCV: 86 fL (ref 79–97)
MONOS ABS: 0.4 10*3/uL (ref 0.1–0.9)
Monocytes: 5 %
Neutrophils Absolute: 6.1 10*3/uL (ref 1.4–7.0)
Neutrophils: 73 %
Platelets: 334 10*3/uL (ref 150–379)
RBC: 4 x10E6/uL (ref 3.77–5.28)
RDW: 15.9 % — AB (ref 12.3–15.4)
WBC: 8.4 10*3/uL (ref 3.4–10.8)

## 2016-03-11 NOTE — Telephone Encounter (Deleted)
:   blood platelets now normal and red cells coming up nicely from 8.9 to 11.3. Stay on iron supplement.

## 2016-03-11 NOTE — Telephone Encounter (Signed)
Pt called - no answer; left message blood platelets are now normal @ 334 and red cells (hgb) coming up from 8.9 to 11.3 and to stay on iron supplement per Dr Beryle Beams. And to call if she any questions.

## 2016-03-11 NOTE — Progress Notes (Signed)
Patient ID: Courtney [name], female   DOB: 12-09-1981, 35 y.o.   MRN: [id number] Hematology and Oncology Follow Up Visit  Courtney [name] [id number] 10-25-1981 35 y.o. 03/11/2016 3:03 PM   Principle Diagnosis: Encounter Diagnoses  Name Primary?  Cruzita Lederer Willebrand disease type IA ([place]) Yes  . Supervision of high risk pregnancy in third trimester   . S/P cesarean section   . Gross hematuria   . Gestational thrombocytopenia, third trimester [facility])    Interim History:  35 year old woman I saw for the first time on February 20 when she was in the third trimester of her second pregnancy. She had a history of mild type I von Willebrand's disorder. She is also Rh-. She had a prolonged labor with her first child at age 35 requiring an emergency C-section under general anesthesia. She did not require any prophylactic treatment and did not have any postpartum bleeding. She has never been given a trial of DDAVP before. She had baseline von Willebrand factor studies drawn on January 12. Levels were much lower than one would expect for somebody who was pregnant. I felt that these levels were suspect and repeated the VW panel in another lab with very different results: VW antigen 146% of control, factor VIII activity 141% of control and ristocetin cofactor activity 65% of control. I was originally going to give her prophylactic Humate-P but after consultation with a expert in our field, given the excellent factor levels near-term, prophylactic treatment not felt to be necessary.  She went into labor a few days earlier than a planned elective C-sections. She had an uncomplicated C-section on March 14. No excessive bleeding was encountered during the procedure. However, postpartum, she developed gross hematuria. I saw the patient in the hospital. Examination was unremarkable. No bleeding from the C-section incision. No excessive vaginal bleeding. Coagulation studies were normal. Fibrinogen normal. Review  of the peripheral blood film did not show any signs of microangiopathic hemolytic anemia. Her platelet count was slightly decreased in the third trimester. However, platelet count did not fall lower than 118,000 around time of delivery and postpartum went up to 124,000. She was seen in consultation by urology. CT cystography was done and revealed no obvious mass or defects in the bladder. She was treated conservatively with observation alone and urine gradually cleared.  She is due well since hospital discharge. She reports no excessive bleeding. She does note some vaginal bleeding at the time she nurses the baby which also provokes some uterine cramping. She is still taking an iron supplement. A repeat hemoglobin today is 11.3 compare with 8.9 at time of discharge March 16 and a platelet count is up to 334,000.  Her new baby is doing well.  Medications: reviewed  Allergies: No Known Allergies  Review of Systems: See interim history  Remaining ROS negative:   Physical Exam: Blood pressure 130/75, pulse 94, temperature 98.4 F (36.9 C), temperature source Oral, weight 193 lb 4.8 oz (87.68 kg), last menstrual period 05/31/2015, SpO2 100 %, currently breastfeeding. Wt Readings from Last 3 Encounters:  03/10/16 193 lb 4.8 oz (87.68 kg)  03/04/16 196 lb 12.8 oz (89.268 kg)  02/26/16 218 lb (98.884 kg)     General appearance: Well-nourished African-American woman HENNT: Pharynx no erythema, exudate, mass, or ulcer. No thyromegaly or thyroid nodules Lymph nodes: No cervical, supraclavicular, or axillary lymphadenopathy Breasts: Lungs: Clear to auscultation, resonant to percussion throughout Heart: Regular rhythm, no murmur, no gallop, no rub, no click, no edema Abdomen: Soft, nontender, normal  bowel sounds, no mass, no organomegaly. Suprapubic wound healing well. Steri-Strips still in place. Extremities: No edema, no calf tenderness Musculoskeletal:  GU:  Vascular:  Neurologic: Alert,  oriented, PERRLA,  , cranial nerves grossly normal, motor strength 5 over 5, reflexes 1+ symmetric, upper body coordination normal, gait normal, Skin: No rash or ecchymosis  Lab Results: CBC W/Diff    Component Value Date/Time   WBC 8.4 03/10/2016 1358   WBC 9.6 02/28/2016 0802   RBC 4.00 03/10/2016 1358   RBC 2.98* 02/28/2016 0802   HGB 8.9* 02/28/2016 0802   HGB 14.2 01/06/2013   HCT 34.3 03/10/2016 1358   HCT 26.1* 02/28/2016 0802   HCT 42 01/06/2013   PLT 334 03/10/2016 1358   PLT 124* 02/28/2016 0802   PLT 224 01/06/2013   MCV 86 03/10/2016 1358   MCV 87.6 02/28/2016 0802   MCH 28.3 03/10/2016 1358   MCH 29.9 02/28/2016 0802   MCHC 32.9 03/10/2016 1358   MCHC 34.1 02/28/2016 0802   RDW 15.9* 03/10/2016 1358   RDW 15.4 02/28/2016 0802   LYMPHSABS 1.8 03/10/2016 1358   LYMPHSABS 2.2 02/27/2016 1616   MONOABS 0.8 02/27/2016 1616   EOSABS 0.1 03/10/2016 1358   EOSABS 0.0 02/27/2016 1616   BASOSABS 0.0 03/10/2016 1358   BASOSABS 0.0 02/27/2016 1616     Chemistry      Component Value Date/Time   NA 135 02/27/2016 1616   K 3.4* 02/27/2016 1616   CL 103 02/27/2016 1616   CO2 25 02/27/2016 1616   BUN 6 02/27/2016 1616   CREATININE 0.64 02/27/2016 1616      Component Value Date/Time   CALCIUM 8.3* 02/27/2016 1616   ALKPHOS 84 02/27/2016 1616   AST 34 02/27/2016 1616   ALT 15 02/27/2016 1616   BILITOT 0.8 02/27/2016 1616      Impression:  #1. Type I von Willebrand's. 2 weeks status post uncomplicated C-section. #2. Transient gross hematuria and not clearly related to von Willebrand's since no other areas of bleeding. #3. Rh-. Her baby turned out to be Rh- so she does not need RhoGAM this time #4. Mild gestational thrombocytopenia nadir platelet count 118,000. Full recovery.  I'm going to have her come back in May for provocative testing with DDAVP. She is young and will no doubt need to have other surgical procedures in the future. I will back to demonstrate that  she responds to this medication which is useful to cover minor surgery. She will come to the [facility] short stay unit on May 1 for a dose of DDAVP with baseline von Willebrand profile and one hour post infusion levels.  CC: Patient Care Team: Golden Circle, FNP as PCP - General (Family Medicine) Annia Belt, MD as Consulting Physician (Oncology)   Annia Belt, MD 3/28/20173:03 PM

## 2016-03-11 NOTE — Telephone Encounter (Signed)
-----   Message from Annia Belt, MD sent at 03/11/2016 11:10 AM EDT ----- Call pt: blood platelets now normal and red cells coming up nicely from 8.9 to 11.3. Stay on iron supplement.

## 2016-03-18 ENCOUNTER — Ambulatory Visit (INDEPENDENT_AMBULATORY_CARE_PROVIDER_SITE_OTHER): Payer: 59 | Admitting: Obstetrics

## 2016-03-18 ENCOUNTER — Encounter: Payer: Self-pay | Admitting: Obstetrics

## 2016-03-18 VITALS — BP 120/82 | HR 90 | Temp 99.4°F | Wt 188.0 lb

## 2016-03-18 DIAGNOSIS — O99345 Other mental disorders complicating the puerperium: Principal | ICD-10-CM

## 2016-03-18 DIAGNOSIS — F53 Postpartum depression: Secondary | ICD-10-CM

## 2016-03-18 LAB — POCT URINALYSIS DIPSTICK
Bilirubin, UA: NEGATIVE
Clarity, UA: NEGATIVE
GLUCOSE UA: NEGATIVE
Ketones, UA: NEGATIVE
NITRITE UA: NEGATIVE
UROBILINOGEN UA: 0.2
pH, UA: 6

## 2016-03-24 ENCOUNTER — Encounter: Payer: Self-pay | Admitting: Obstetrics

## 2016-03-24 NOTE — Progress Notes (Signed)
Subjective:     Courtney [name] is a 35 y.o. female who presents for a postpartum visit. She is 4 weeks postpartum following a low cervical transverse Cesarean section. I have fully reviewed the prenatal and intrapartum course. The delivery was at 35 gestational weeks. Outcome: repeat cesarean section, low transverse incision. Anesthesia: General. Postpartum course has been normal. Baby's course has been normal. Baby is feeding by breast. Bleeding thin lochia. Bowel function is normal. Bladder function is normal. Patient is not sexually active. Contraception method is tubal ligation. Postpartum depression screening: negative.  Tobacco, alcohol and substance abuse history reviewed.  Adult immunizations reviewed including TDAP, rubella and varicella.  The following portions of the patient's history were reviewed and updated as appropriate: allergies, current medications, past family history, past medical history, past social history, past surgical history and problem list.  Review of Systems A comprehensive review of systems was negative.   Objective:    BP 120/82 mmHg  Pulse 90  Temp(Src) 99.4 F (37.4 C)  Wt 188 lb (85.276 kg)  General:  alert and no distress   Breasts:  inspection negative, no nipple discharge or bleeding, no masses or nodularity palpable  Lungs: clear to auscultation bilaterally  Heart:  regular rate and rhythm, S1, S2 normal, no murmur, click, rub or gallop  Abdomen: soft, non-tender; bowel sounds normal; no masses,  no organomegaly   Vulva:  normal  Vagina: normal vagina  Cervix:  no cervical motion tenderness  Corpus: normal size, contour, position, consistency, mobility, non-tender  Adnexa:  no mass, fullness, tenderness  Rectal Exam: Not performed.           Assessment:     Normal postpartum exam. Pap smear not done at today's visit.    Plan:    1. Contraception: tubal ligation 2. Continue PNV's and iron 3. Follow up in: 4 weeks or as needed.     Healthy lifestyle practices reviewed

## 2016-04-01 ENCOUNTER — Other Ambulatory Visit: Payer: Self-pay | Admitting: Oncology

## 2016-04-11 ENCOUNTER — Other Ambulatory Visit (HOSPITAL_COMMUNITY): Payer: Self-pay | Admitting: *Deleted

## 2016-04-14 ENCOUNTER — Encounter (HOSPITAL_COMMUNITY)
Admission: RE | Admit: 2016-04-14 | Discharge: 2016-04-14 | Disposition: A | Discharge status: Home / Self Care | Payer: 59 | Source: Ambulatory Visit | Attending: Oncology | Admitting: Oncology

## 2016-04-14 VITALS — BP 121/73 | HR 83 | Temp 98.4°F | Resp 20 | Ht 63.0 in | Wt 193.0 lb

## 2016-04-14 DIAGNOSIS — D6801 Von willebrand disease, type 1: Secondary | ICD-10-CM

## 2016-04-14 DIAGNOSIS — D680 Von Willebrand's disease: Secondary | ICD-10-CM | POA: Insufficient documentation

## 2016-04-14 MED ORDER — SODIUM CHLORIDE 0.9 % IV SOLN
25.0000 ug | Freq: Once | INTRAVENOUS | Status: AC
  Administered 2016-04-14: 25 ug via INTRAVENOUS
  Filled 2016-04-14: qty 6.3

## 2016-04-15 ENCOUNTER — Ambulatory Visit: Payer: 59 | Admitting: Obstetrics

## 2016-04-16 ENCOUNTER — Encounter: Payer: Self-pay | Admitting: Obstetrics

## 2016-04-16 ENCOUNTER — Ambulatory Visit (INDEPENDENT_AMBULATORY_CARE_PROVIDER_SITE_OTHER): Payer: 59 | Admitting: Obstetrics

## 2016-04-16 DIAGNOSIS — D6800 Von Willebrand disease, unspecified: Secondary | ICD-10-CM

## 2016-04-16 LAB — VON WILLEBRAND PANEL
COAGULATION FACTOR VIII: 69 % (ref 57–163)
COAGULATION FACTOR VIII: 140 % (ref 57–163)
RISTOCETIN CO-FACTOR, PLASMA: 22 % — AB (ref 50–200)
Ristocetin Co-factor, Plasma: 41 % — ABNORMAL LOW (ref 50–200)
VON WILLEBRAND ANTIGEN, PLASMA: 44 % — AB (ref 50–200)
Von Willebrand Antigen, Plasma: 72 % (ref 50–200)

## 2016-04-16 LAB — COAG STUDIES INTERP REPORT: PDF Image: 0

## 2016-04-17 ENCOUNTER — Encounter: Payer: Self-pay | Admitting: Obstetrics

## 2016-04-17 NOTE — Progress Notes (Addendum)
Subjective:     Courtney [name] is a 35 y.o. female who presents for a postpartum visit. She is 4 weeks postpartum following a low cervical transverse Cesarean section. I have fully reviewed the prenatal and intrapartum course. The delivery was at 38.5 gestational weeks. Outcome: repeat cesarean section, low transverse incision and BTL. Anesthesia: general. Postpartum course has been normal. Baby's course has been normal. Baby is feeding by breast. Bleeding no bleeding. Bowel function is normal. Bladder function is normal. Patient is not sexually active. Contraception method is abstinence. Postpartum depression screening: negative.  Tobacco, alcohol and substance abuse history reviewed.  Adult immunizations reviewed including TDAP, rubella and varicella.  The following portions of the patient's history were reviewed and updated as appropriate: allergies, current medications, past family history, past medical history, past social history, past surgical history and problem list.  Review of Systems A comprehensive review of systems was negative.   Objective:    BP 116/76 mmHg  Pulse 75  Wt 190 lb (86.183 kg)  General:  alert and no distress   Breasts:  inspection negative, no nipple discharge or bleeding, no masses or nodularity palpable  Lungs: clear to auscultation bilaterally  Heart:  regular rate and rhythm, S1, S2 normal, no murmur, click, rub or gallop  Abdomen: soft, non-tender; bowel sounds normal; no masses,  no organomegaly   Vulva:  normal  Vagina: normal vagina  Cervix:  no cervical motion tenderness  Corpus: normal size, contour, position, consistency, mobility, non-tender  Adnexa:  no mass, fullness, tenderness  Rectal Exam: Not performed.          Assessment:     Normal postpartum exam. Pap smear not done at today's visit.     H/O von Willebrand's Disease  Plan:    1. Contraception: tubal ligation 2. Continue PNV's and iron 3. Follow up in: 6 weeks or as needed.     Healthy lifestyle practices reviewed

## 2016-05-28 ENCOUNTER — Ambulatory Visit (INDEPENDENT_AMBULATORY_CARE_PROVIDER_SITE_OTHER): Payer: 59 | Admitting: Obstetrics

## 2016-05-28 ENCOUNTER — Encounter: Payer: Self-pay | Admitting: Obstetrics

## 2016-05-28 DIAGNOSIS — I10 Essential (primary) hypertension: Secondary | ICD-10-CM | POA: Diagnosis not present

## 2016-05-29 ENCOUNTER — Encounter: Payer: Self-pay | Admitting: Obstetrics

## 2016-05-29 NOTE — Progress Notes (Signed)
Subjective:     Courtney [name] is a 35 y.o. female who presents for a postpartum visit. She is 12 weeks  postpartum following a low cervical transverse Cesarean section. I have fully reviewed the prenatal and intrapartum course. The delivery was at 65 gestational weeks. Outcome: repeat cesarean section, low transverse incision. Anesthesia: general. Postpartum course has been normal. Baby's course has been normal. Baby is feeding by breast. Bleeding no bleeding. Bowel function is normal. Bladder function is normal. Patient is not sexually active. Contraception method is tubal ligation. Postpartum depression screening: negative.  Tobacco, alcohol and substance abuse history reviewed.  Adult immunizations reviewed including TDAP, rubella and varicella.  The following portions of the patient's history were reviewed and updated as appropriate: allergies, current medications, past family history, past medical history, past social history, past surgical history and problem list.  Review of Systems A comprehensive review of systems was negative.   Objective:    BP 123/82 mmHg  Pulse 82  Wt 190 lb (86.183 kg)  General:  alert and no distress   Breasts:  inspection negative, no nipple discharge or bleeding, no masses or nodularity palpable  Lungs: clear to auscultation bilaterally  Heart:  regular rate and rhythm, S1, S2 normal, no murmur, click, rub or gallop  Abdomen: normal findings: soft, non-tender and incision is C, D, I.   Vulva:  normal  Vagina: normal vagina  Cervix:  no cervical motion tenderness  Corpus: normal size, contour, position, consistency, mobility, non-tender  Adnexa:  no mass, fullness, tenderness  Rectal Exam: Not performed.           Assessment:     Normal postpartum exam. Pap smear not done at today's visit.    Plan:    1. Contraception: tubal ligation 2. Continue PNV's 3. Follow up in: 6 weeks or as needed.   Healthy lifestyle practices reviewed

## 2016-06-25 ENCOUNTER — Ambulatory Visit: Payer: 59 | Admitting: Obstetrics

## 2016-09-29 ENCOUNTER — Ambulatory Visit: Payer: 59 | Admitting: Oncology

## 2017-02-17 ENCOUNTER — Ambulatory Visit: Payer: 59 | Admitting: Obstetrics

## 2017-03-02 ENCOUNTER — Encounter: Payer: Self-pay | Admitting: Obstetrics

## 2017-03-02 ENCOUNTER — Ambulatory Visit (INDEPENDENT_AMBULATORY_CARE_PROVIDER_SITE_OTHER): Payer: 59 | Admitting: Obstetrics

## 2017-03-02 VITALS — BP 132/73 | HR 102 | Ht 63.0 in | Wt 180.0 lb

## 2017-03-02 DIAGNOSIS — Z124 Encounter for screening for malignant neoplasm of cervix: Secondary | ICD-10-CM

## 2017-03-02 DIAGNOSIS — Z0000 Encounter for general adult medical examination without abnormal findings: Secondary | ICD-10-CM

## 2017-03-02 DIAGNOSIS — Z113 Encounter for screening for infections with a predominantly sexual mode of transmission: Secondary | ICD-10-CM

## 2017-03-02 DIAGNOSIS — N898 Other specified noninflammatory disorders of vagina: Secondary | ICD-10-CM

## 2017-03-02 DIAGNOSIS — Z01419 Encounter for gynecological examination (general) (routine) without abnormal findings: Secondary | ICD-10-CM | POA: Diagnosis not present

## 2017-03-02 NOTE — Progress Notes (Signed)
Subjective:        Courtney [name] is a 36 y.o. female here for a routine exam.  Current complaints: None.    Personal health questionnaire:  Is patient Ashkenazi Jewish, have a family history of breast and/or ovarian cancer: no Is there a family history of uterine cancer diagnosed at age < 65, gastrointestinal cancer, urinary tract cancer, family member who is a Field seismologist syndrome-associated carrier: no Is the patient overweight and hypertensive, family history of diabetes, personal history of gestational diabetes, preeclampsia or PCOS: no Is patient over 37, have PCOS,  family history of premature CHD under age 37, diabetes, smoke, have hypertension or peripheral artery disease:  no At any time, has a partner hit, kicked or otherwise hurt or frightened you?: no Over the past 2 weeks, have you felt down, depressed or hopeless?: no Over the past 2 weeks, have you felt little interest or pleasure in doing things?:no   Gynecologic History Patient's last menstrual period was 02/19/2017. Contraception: tubal ligation Last Pap: 2016. Results were: normal Last mammogram: n/a. Results were: n/a  Obstetric History OB History  Gravida Para Term Preterm AB Living  2 2 2     2   SAB TAB Ectopic Multiple Live Births        0 2    # Outcome Date GA Lbr Len/2nd Weight Sex Delivery Anes PTL Lv  2 Term 02/26/16 [redacted]w[redacted]d / 00:44 7 lb 13.9 oz (3.57 kg) M CS-Vac Gen  LIV  1 Term 08/17/13 [redacted]w[redacted]d 21:31 / 04:17 8 lb 4.6 oz (3.759 kg) M CS-LTranv Gen  LIV      Past Medical History:  Diagnosis Date  . Allergy   . Hypertension   . Supervision of high risk pregnancy in third trimester 02/04/2016  . UTI (lower urinary tract infection)   . Von Willebrand disease ([place])   . Von Willebrand disease type IA ([place]) 02/04/2016    Past Surgical History:  Procedure Laterality Date  . CESAREAN SECTION N/A 08/17/2013   Procedure: Primary Cesarean Section Delivery Baby Boy @ 0932, Apgars 9/10 ;  Surgeon: Lahoma Crocker, MD;  Location: [facility];  Service: Obstetrics;  Laterality: N/A;  . CESAREAN SECTION N/A 02/26/2016   Procedure: CESAREAN SECTION WITH BILATERAL TUBAL LIGATION ;  Surgeon: Shelly Bombard, MD;  Location: [facility];  Service: Obstetrics;  Laterality: N/A;  . GYNECOLOGIC CRYOSURGERY    . hemmeroid       Current Outpatient Prescriptions:  .  triamterene-hydrochlorothiazide (DYAZIDE) 37.5-25 MG capsule, Take 1 each (1 capsule total) by mouth every morning., Disp: 30 capsule, Rfl: 11 .  acetaminophen (TYLENOL) 500 MG tablet, Take 500 mg by mouth every 6 (six) hours as needed. Reported on 03/18/2016, Disp: , Rfl:  .  calcium carbonate (TUMS EX) 750 MG chewable tablet, Chew 1 tablet by mouth daily as needed for heartburn. Reported on 03/18/2016, Disp: , Rfl:  .  FeAsp-B12-FA-C-DSS-SuccAc-Zn (FERIVA 21/7) 75-1 MG TABS, Take 1 tablet by mouth daily., Disp: , Rfl:  .  Prenatal Vit-Fe Fumarate-FA (MULTIVITAMIN-PRENATAL) 27-0.8 MG TABS tablet, Take 1 tablet by mouth daily at 12 noon., Disp: , Rfl:  No Known Allergies  Social History  Substance Use Topics  . Smoking status: Never Smoker  . Smokeless tobacco: Never Used  . Alcohol use 0.0 oz/week     Comment: occ.     Family History  Problem Relation Age of Onset  . Diabetes Maternal Grandmother   . Hypertension Maternal Grandmother   .  Cancer Maternal Grandfather   . Emphysema Maternal Grandfather   . Diabetes Paternal Grandmother   . Hypertension Paternal Grandmother   . Cancer Paternal Grandfather       Review of Systems  Constitutional: negative for fatigue and weight loss Respiratory: negative for cough and wheezing Cardiovascular: negative for chest pain, fatigue and palpitations Gastrointestinal: negative for abdominal pain and change in bowel habits Musculoskeletal:negative for myalgias Neurological: negative for gait problems and tremors Behavioral/Psych: negative for abusive relationship, depression Endocrine: negative for  temperature intolerance    Genitourinary:negative for abnormal menstrual periods, genital lesions, hot flashes, sexual problems and vaginal discharge Integument/breast: negative for breast lump, breast tenderness, nipple discharge and skin lesion(s)    Objective:       BP 132/73   Pulse (!) 102   Ht 5\' 3"  (1.6 m)   Wt 180 lb (81.6 kg)   LMP 02/19/2017   BMI 31.89 kg/m  General:   alert  Skin:   no rash or abnormalities  Lungs:   clear to auscultation bilaterally  Heart:   regular rate and rhythm, S1, S2 normal, no murmur, click, rub or gallop  Breasts:   normal without suspicious masses, skin or nipple changes or axillary nodes  Abdomen:  normal findings: no organomegaly, soft, non-tender and no hernia  Pelvis:  External genitalia: normal general appearance Urinary system: urethral meatus normal and bladder without fullness, nontender Vaginal: normal without tenderness, induration or masses Cervix: normal appearance Adnexa: normal bimanual exam Uterus: anteverted and non-tender, normal size   Lab Review Urine pregnancy test Labs reviewed yes Radiologic studies reviewed no  50% of 20 min visit spent on counseling and coordination of care.    Assessment:    Healthy female exam.    Plan:    Education reviewed: calcium supplements, low fat, low cholesterol diet, safe sex/STD prevention, self breast exams and weight bearing exercise. Contraception: tubal ligation. Follow up in: 1 year.   No orders of the defined types were placed in this encounter.  No orders of the defined types were placed in this encounter.    Patient ID: Courtney [name], female   DOB: August 18, 1981, 36 y.o.   MRN: [id number]

## 2017-03-02 NOTE — Progress Notes (Signed)
Pt presents for annual, pap, and STD testing, Pt c/o vaginal irritation after using fragrance soap. Declines blood work for STD today.

## 2017-03-03 LAB — CERVICOVAGINAL ANCILLARY ONLY
Bacterial vaginitis: POSITIVE — AB
CANDIDA VAGINITIS: NEGATIVE
CHLAMYDIA, DNA PROBE: NEGATIVE
NEISSERIA GONORRHEA: NEGATIVE
TRICH (WINDOWPATH): NEGATIVE

## 2017-03-04 ENCOUNTER — Other Ambulatory Visit: Payer: Self-pay | Admitting: Obstetrics

## 2017-03-04 ENCOUNTER — Encounter: Payer: Self-pay | Admitting: *Deleted

## 2017-03-04 DIAGNOSIS — B9689 Other specified bacterial agents as the cause of diseases classified elsewhere: Secondary | ICD-10-CM

## 2017-03-04 DIAGNOSIS — N760 Acute vaginitis: Principal | ICD-10-CM

## 2017-03-04 MED ORDER — METRONIDAZOLE 500 MG PO TABS: 500.0000 mg | ORAL_TABLET | Freq: Two times a day (BID) | ORAL | 2 refills | Status: DC

## 2017-03-05 IMAGING — US US OB COMP LESS 14 WK
1 series · 13 of 28 positions shown · non-contrast
Comparison: No prior scans from this gestation.

CLINICAL DATA: 34-year-old pregnant female with first trimester
bleeding.

LMP 05/31/2015, EDC by LMP: 03/06/2016, projecting to an expected
gestational age of 7 weeks 1 day.
EXAM:
OBSTETRIC <14 WK US AND TRANSVAGINAL OB US
TECHNIQUE: Both transabdominal and transvaginal ultrasound examinations were
performed for complete evaluation of the gestation as well as the
maternal uterus, adnexal regions, and pelvic cul-de-sac.
Transvaginal technique was performed to assess early pregnancy.

[Series 1: us ob comp less 14 wk · 62 acquisitions, 13 frames shown]
[im 3/62]
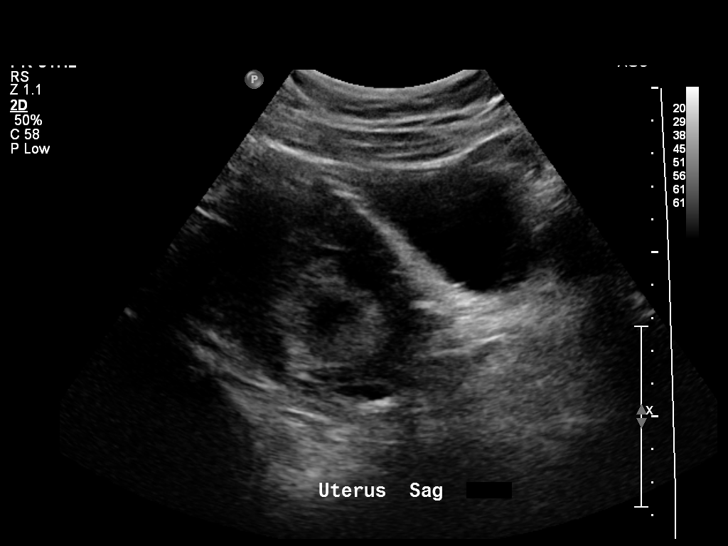
[im 7/62]
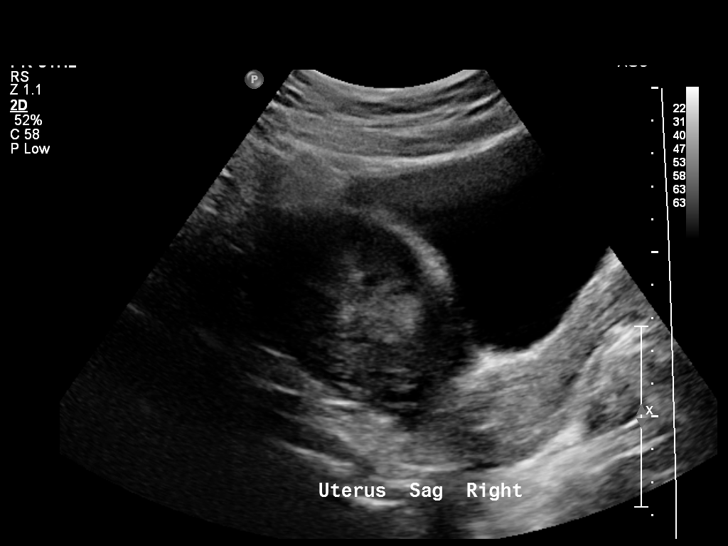
[im 12/62]
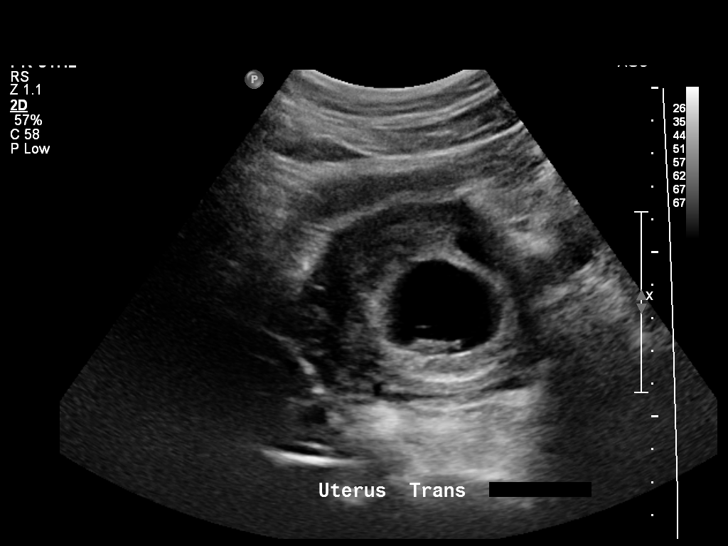
[im 16/62]
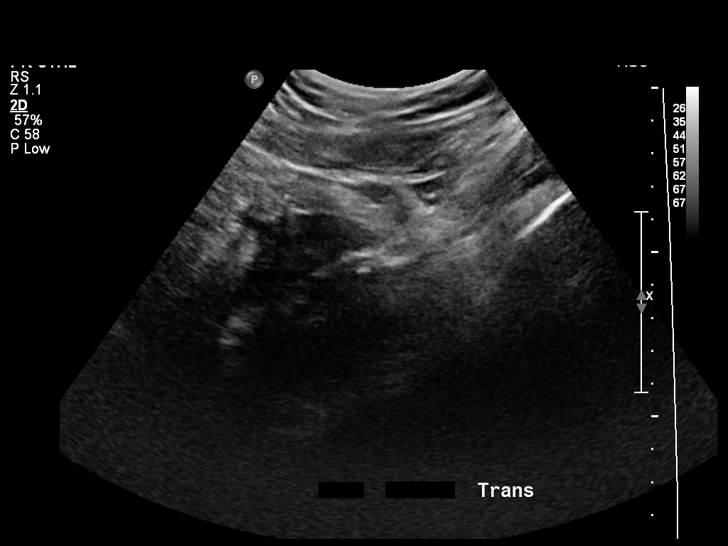
[im 21/62]
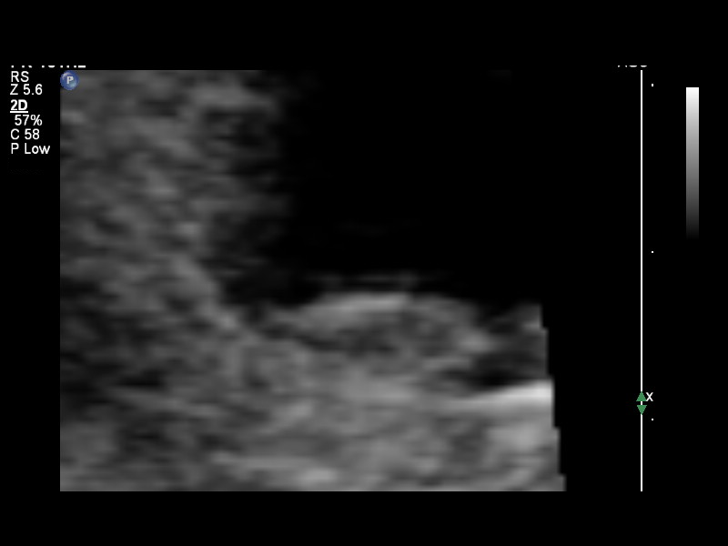
[im 25/62]
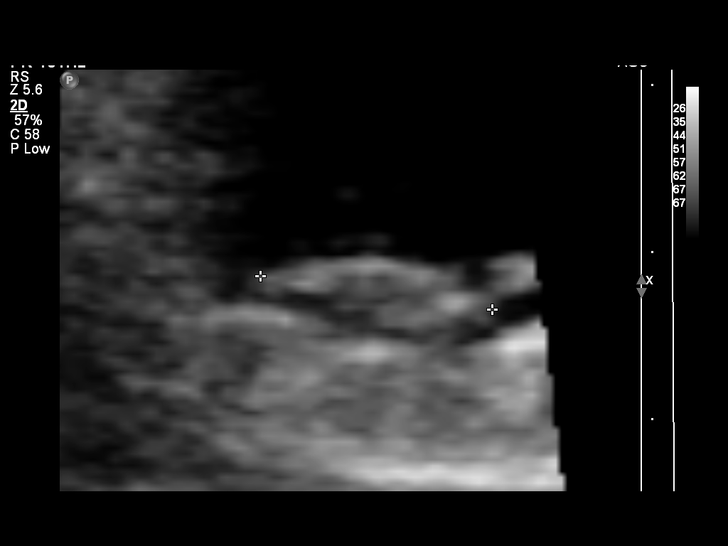
[im 32/62]
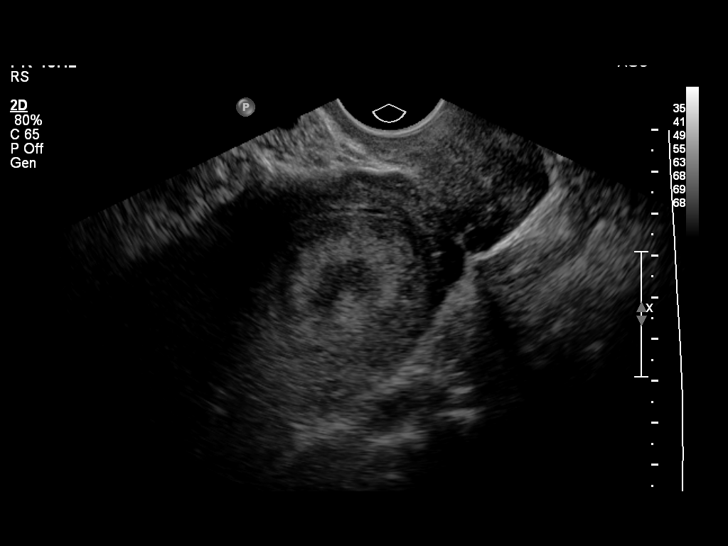
[im 37/62]
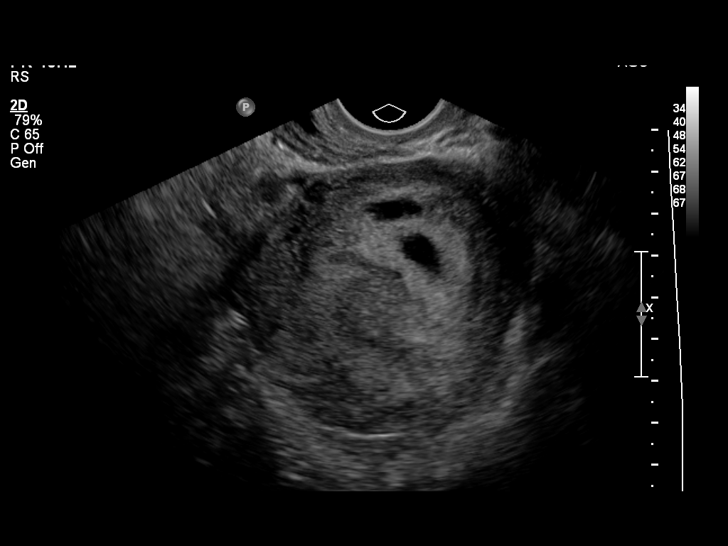
[im 41/62]
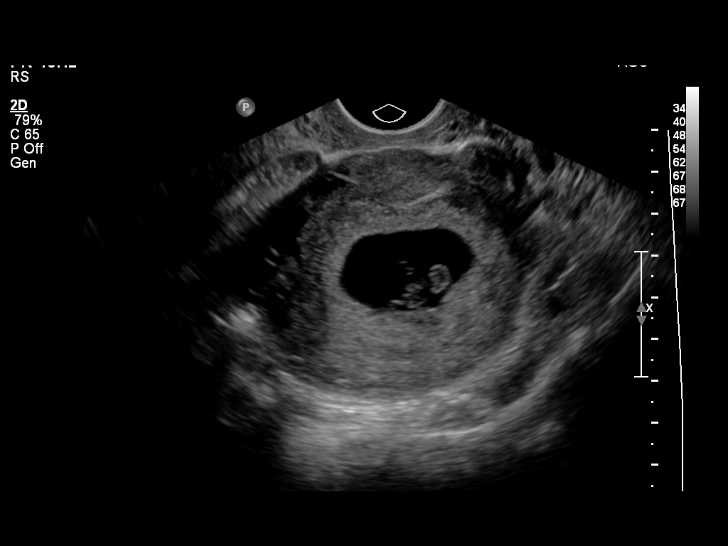
[im 46/62]
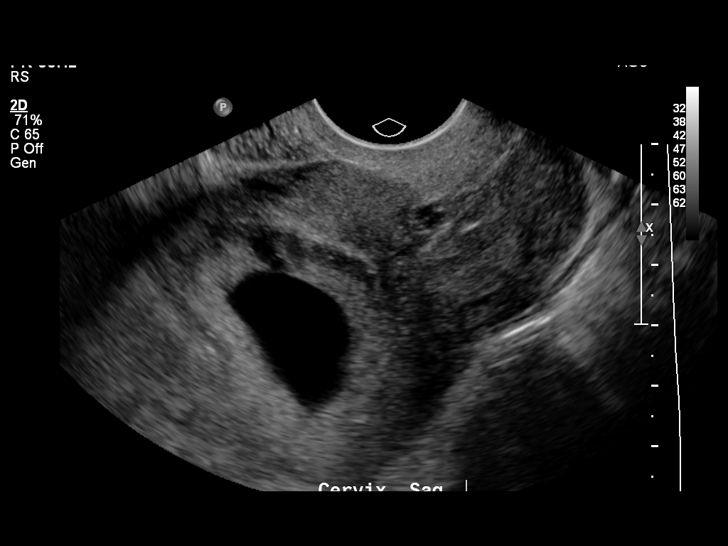
[im 50/62]
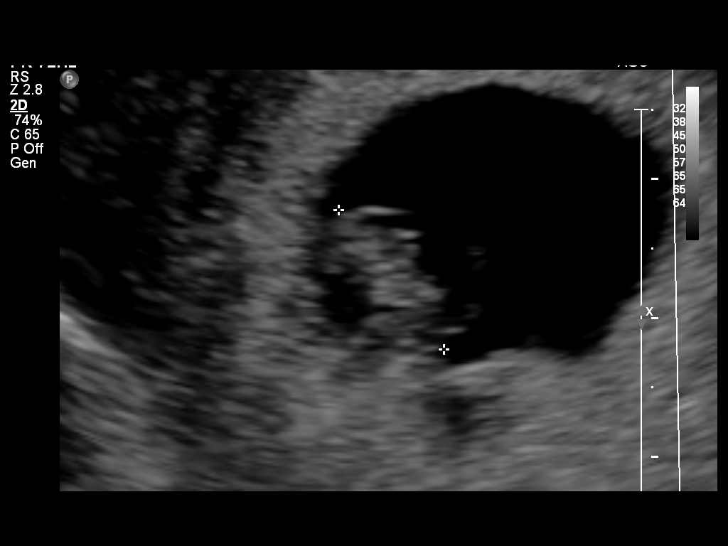
[im 55/62]
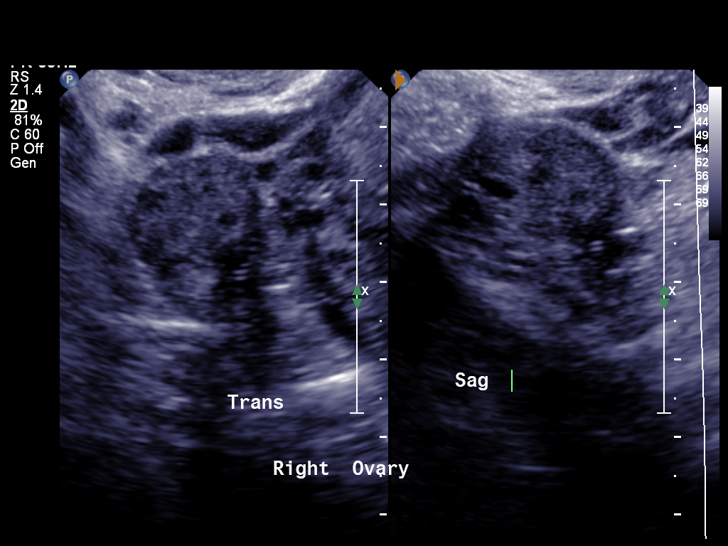
[im 59/62]
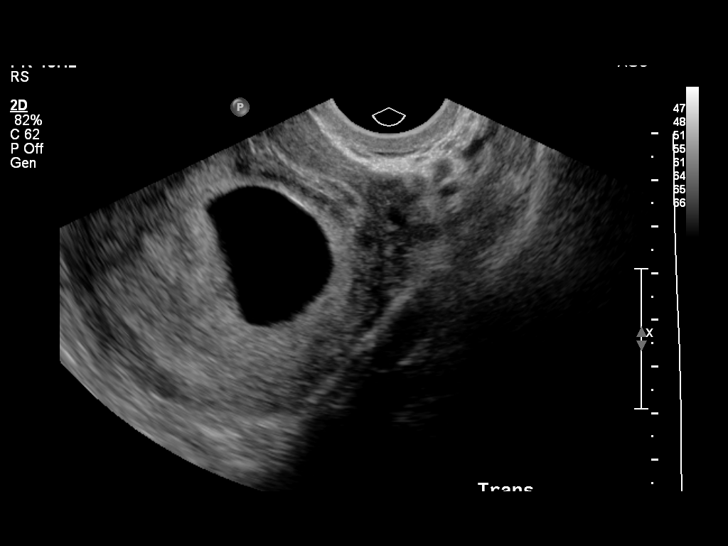

[13 of 28 positions shown; findings below may reference images not displayed]

FINDINGS: Intrauterine gestational sac: Visualized/normal in size, shape and
position. There is a moderate perigestational bleed in the
anterior/inferior endometrial cavity involving approximately 50% of
the gestational sac circumference, and measuring approximately 3.3 x
0.7 x 3.5 cm in size.

Yolk sac:  Normal, 4 mm size.

Embryo:  Present and grossly normal.

Cardiac Activity: Regular rate and rhythm

Heart Rate: 158  bpm

CRL:  13  mm   7 w   4 d                  US EDC: 03/03/2016

Maternal uterus/adnexae: Normal Cesarean scar is seen in the
anterior lower uterine segment. No uterine fibroids. Maternal right
ovary measures 2.7 x 2.2 x 1.7 cm and is normal. The left ovary is
not visualized, likely due to overlying bowel gas. There are no
suspicious adnexal masses. No abnormal free fluid is seen in the
pelvis.
IMPRESSION: 1. Single living intrauterine gestation at 7 weeks 4 days by
crown-rump length, with no significant discrepancy with the expected
gestational age of 7 weeks 1 day by provided menstrual dating.
2. Normal embryonic cardiac activity.
3. Moderate perigestational bleed as described.

## 2017-03-06 LAB — CYTOLOGY - PAP
DIAGNOSIS: NEGATIVE
HPV (WINDOPATH): NOT DETECTED

## 2017-07-29 ENCOUNTER — Encounter: Payer: Self-pay | Admitting: Obstetrics

## 2017-07-29 ENCOUNTER — Ambulatory Visit (INDEPENDENT_AMBULATORY_CARE_PROVIDER_SITE_OTHER): Payer: 59 | Admitting: Obstetrics

## 2017-07-29 VITALS — BP 125/72 | HR 105 | Ht 63.0 in | Wt 175.1 lb

## 2017-07-29 DIAGNOSIS — R42 Dizziness and giddiness: Secondary | ICD-10-CM

## 2017-07-29 DIAGNOSIS — N944 Primary dysmenorrhea: Secondary | ICD-10-CM | POA: Diagnosis not present

## 2017-07-29 DIAGNOSIS — N943 Premenstrual tension syndrome: Secondary | ICD-10-CM | POA: Diagnosis not present

## 2017-07-29 DIAGNOSIS — Z3483 Encounter for supervision of other normal pregnancy, third trimester: Secondary | ICD-10-CM

## 2017-07-29 DIAGNOSIS — R110 Nausea: Secondary | ICD-10-CM

## 2017-07-29 MED ORDER — ONDANSETRON HCL 4 MG PO TABS: ORAL_TABLET | ORAL | 11 refills | Status: DC

## 2017-07-29 MED ORDER — MECLIZINE HCL 12.5 MG PO TABS: ORAL_TABLET | ORAL | 11 refills | Status: DC

## 2017-07-29 MED ORDER — FLUOXETINE HCL (PMDD) 20 MG PO CAPS: 1.0000 | ORAL_CAPSULE | Freq: Every day | ORAL | 11 refills | Status: DC

## 2017-07-29 MED ORDER — IBUPROFEN 800 MG PO TABS: ORAL_TABLET | ORAL | 11 refills | Status: DC

## 2017-07-29 NOTE — Progress Notes (Signed)
Patient reports dizziness, nausea, and vomiting at the start of her menstrual cycle, which lasts 7-10 days with heavy bleeding. Pt states that symptoms have caused her to missed work.

## 2017-07-29 NOTE — Progress Notes (Signed)
Patient ID: Courtney [name], female   DOB: [birth year], 36 y.o.   MRN: [id number]  Chief Complaint  Patient presents with  . GYN    HPI Courtney [name] is a 36 y.o. female.  Complains of disabling HA, dizziness and cramping with periods.  Has not been able to work during this time.  She is S/P BTL with last C/S.  She has normal menstrual flow and duration. HPI  Past Medical History:  Diagnosis Date  . Allergy   . Hypertension   . Supervision of high risk pregnancy in third trimester 02/04/2016  . UTI (lower urinary tract infection)   . Von Willebrand disease ([place])   . Von Willebrand disease type IA ([place]) 02/04/2016    Past Surgical History:  Procedure Laterality Date  . CESAREAN SECTION N/A 08/17/2013   Procedure: Primary Cesarean Section Delivery Baby Boy @ 4696, Apgars 9/10 ;  Surgeon: Lahoma Crocker, MD;  Location: [facility];  Service: Obstetrics;  Laterality: N/A;  . CESAREAN SECTION N/A 02/26/2016   Procedure: CESAREAN SECTION WITH BILATERAL TUBAL LIGATION ;  Surgeon: Shelly Bombard, MD;  Location: [facility];  Service: Obstetrics;  Laterality: N/A;  . GYNECOLOGIC CRYOSURGERY    . hemmeroid      Family History  Problem Relation Age of Onset  . Diabetes Maternal Grandmother   . Hypertension Maternal Grandmother   . Cancer Maternal Grandfather   . Emphysema Maternal Grandfather   . Diabetes Paternal Grandmother   . Hypertension Paternal Grandmother   . Cancer Paternal Grandfather     Social History Social History  Substance Use Topics  . Smoking status: Never Smoker  . Smokeless tobacco: Never Used  . Alcohol use 0.0 oz/week     Comment: occ.     No Known Allergies  Current Outpatient Prescriptions  Medication Sig Dispense Refill  . FeAsp-B12-FA-C-DSS-SuccAc-Zn (FERIVA 21/7) 75-1 MG TABS Take 1 tablet by mouth daily.    . Prenatal Vit-Fe Fumarate-FA (MULTIVITAMIN-PRENATAL) 27-0.8 MG TABS tablet Take 1 tablet by mouth daily at 12 noon.    .  triamterene-hydrochlorothiazide (DYAZIDE) 37.5-25 MG capsule Take 1 each (1 capsule total) by mouth every morning. 30 capsule 11  . acetaminophen (TYLENOL) 500 MG tablet Take 500 mg by mouth every 6 (six) hours as needed. Reported on 03/18/2016    . calcium carbonate (TUMS EX) 750 MG chewable tablet Chew 1 tablet by mouth daily as needed for heartburn. Reported on 03/18/2016    . Fluoxetine HCl, PMDD, 20 MG CAPS Take 1 capsule (20 mg total) by mouth daily before breakfast. 30 each 11  . ibuprofen (ADVIL,MOTRIN) 800 MG tablet Take 1 tab p.o. q 8 hrs starting 2 days before period until period ends, monthly. 30 tablet 11  . meclizine (ANTIVERT) 12.5 MG tablet Take 1 tab p.o. q 8 hrs starting 2 days before before period until period ends, monthly. 30 tablet 11  . metroNIDAZOLE (FLAGYL) 500 MG tablet Take 1 tablet (500 mg total) by mouth 2 (two) times daily. (Patient not taking: Reported on 07/29/2017) 14 tablet 2  . ondansetron (ZOFRAN) 4 MG tablet Take 1 tab q 8 hrs starting 2 days before period until period ends, monthly. 30 tablet 11   No current facility-administered medications for this visit.     Review of Systems Review of Systems Constitutional: negative for fatigue and weight loss Respiratory: negative for cough and wheezing Cardiovascular: negative for chest pain, fatigue and palpitations Gastrointestinal: negative for abdominal pain and change in bowel habits Genitourinary:positive  for severe HA, dizziness and cramping with periods Integument/breast: negative for nipple discharge Musculoskeletal:negative for myalgias Neurological: negative for gait problems and tremors Behavioral/Psych: negative for abusive relationship, depression Endocrine: negative for temperature intolerance      Blood pressure 125/72, pulse (!) 105, height 5\' 3"  (1.6 m), weight 175 lb 1.6 oz (79.4 kg), last menstrual period 07/27/2017, not currently breastfeeding.  Physical Exam Physical Exam:  Deferred  >50% of  15 min visit spent on counseling and coordination of care.    Data Reviewed Surgical history  Assessment and Plan:    1. Primary dysmenorrhea Rx: - ibuprofen (ADVIL,MOTRIN) 800 MG tablet; Take 1 tab p.o. q 8 hrs starting 2 days before period until period ends, monthly.  Dispense: 30 tablet; Refill: 11  2. Dizziness Rx: - meclizine (ANTIVERT) 12.5 MG tablet; Take 1 tab p.o. q 8 hrs starting 2 days before before period until period ends, monthly.  Dispense: 30 tablet; Refill: 11  3. Nausea Rx: - ondansetron (ZOFRAN) 4 MG tablet; Take 1 tab q 8 hrs starting 2 days before period until period ends, monthly.  Dispense: 30 tablet; Refill: 11  4. PMS (premenstrual syndrome) Rx: - Fluoxetine HCl, PMDD, 20 MG CAPS; Take 1 capsule (20 mg total) by mouth daily before breakfast.  Dispense: 30 each; Refill: 11    Plan    Follow up in 3 months  No orders of the defined types were placed in this encounter.  Meds ordered this encounter  Medications  . meclizine (ANTIVERT) 12.5 MG tablet    Sig: Take 1 tab p.o. q 8 hrs starting 2 days before before period until period ends, monthly.    Dispense:  30 tablet    Refill:  11  . ondansetron (ZOFRAN) 4 MG tablet    Sig: Take 1 tab q 8 hrs starting 2 days before period until period ends, monthly.    Dispense:  30 tablet    Refill:  11  . ibuprofen (ADVIL,MOTRIN) 800 MG tablet    Sig: Take 1 tab p.o. q 8 hrs starting 2 days before period until period ends, monthly.    Dispense:  30 tablet    Refill:  11  . Fluoxetine HCl, PMDD, 20 MG CAPS    Sig: Take 1 capsule (20 mg total) by mouth daily before breakfast.    Dispense:  30 each    Refill:  11

## 2017-08-18 ENCOUNTER — Ambulatory Visit: Payer: 59 | Admitting: Obstetrics

## 2017-08-27 ENCOUNTER — Other Ambulatory Visit: Payer: Self-pay | Admitting: Obstetrics

## 2017-08-27 NOTE — Telephone Encounter (Signed)
Review for refill. 

## 2017-09-15 ENCOUNTER — Telehealth: Payer: Self-pay

## 2017-09-15 NOTE — Telephone Encounter (Signed)
Returned call, no answer, left vm 

## 2017-12-28 ENCOUNTER — Ambulatory Visit (INDEPENDENT_AMBULATORY_CARE_PROVIDER_SITE_OTHER): Payer: 59 | Admitting: Obstetrics

## 2017-12-28 ENCOUNTER — Encounter: Payer: Self-pay | Admitting: Obstetrics

## 2017-12-28 VITALS — BP 126/77 | HR 82 | Ht 63.0 in | Wt 163.4 lb

## 2017-12-28 DIAGNOSIS — Z124 Encounter for screening for malignant neoplasm of cervix: Secondary | ICD-10-CM

## 2017-12-28 DIAGNOSIS — N898 Other specified noninflammatory disorders of vagina: Secondary | ICD-10-CM

## 2017-12-28 DIAGNOSIS — N944 Primary dysmenorrhea: Secondary | ICD-10-CM | POA: Diagnosis not present

## 2017-12-28 DIAGNOSIS — Z01419 Encounter for gynecological examination (general) (routine) without abnormal findings: Secondary | ICD-10-CM | POA: Diagnosis not present

## 2017-12-28 DIAGNOSIS — N943 Premenstrual tension syndrome: Secondary | ICD-10-CM | POA: Diagnosis not present

## 2017-12-28 DIAGNOSIS — Z1151 Encounter for screening for human papillomavirus (HPV): Secondary | ICD-10-CM | POA: Diagnosis not present

## 2017-12-28 NOTE — Progress Notes (Signed)
Last Pap 03/02/17

## 2017-12-28 NOTE — Progress Notes (Signed)
Subjective:        Courtney [name] is a 37 y.o. female here for a routine exam.  Current complaints: None.    Personal health questionnaire:  Is patient Ashkenazi Jewish, have a family history of breast and/or ovarian cancer: no Is there a family history of uterine cancer diagnosed at age < 37, gastrointestinal cancer, urinary tract cancer, family member who is a Field seismologist syndrome-associated carrier: no Is the patient overweight and hypertensive, family history of diabetes, personal history of gestational diabetes, preeclampsia or PCOS: no Is patient over 53, have PCOS,  family history of premature CHD under age 46, diabetes, smoke, have hypertension or peripheral artery disease:  no At any time, has a partner hit, kicked or otherwise hurt or frightened you?: no Over the past 2 weeks, have you felt down, depressed or hopeless?: no Over the past 2 weeks, have you felt little interest or pleasure in doing things?:no   Gynecologic History Patient's last menstrual period was 12/17/2017. Contraception: tubal ligation Last Pap: 2017. Results were: normal Last mammogram: n/a. Results were: n/a  Obstetric History OB History  Gravida Para Term Preterm AB Living  2 2 2     2   SAB TAB Ectopic Multiple Live Births        0 2    # Outcome Date GA Lbr Len/2nd Weight Sex Delivery Anes PTL Lv  2 Term 02/26/16 [redacted]w[redacted]d / 00:44 7 lb 13.9 oz (3.57 kg) M CS-Vac Gen  LIV  1 Term 08/17/13 [redacted]w[redacted]d 21:31 / 04:17 8 lb 4.6 oz (3.759 kg) M CS-LTranv Gen  LIV      Past Medical History:  Diagnosis Date  . Allergy   . Hypertension   . Supervision of high risk pregnancy in third trimester 02/04/2016  . UTI (lower urinary tract infection)   . Von Willebrand disease ([place])   . Von Willebrand disease type IA ([place]) 02/04/2016    Past Surgical History:  Procedure Laterality Date  . CESAREAN SECTION N/A 08/17/2013   Procedure: Primary Cesarean Section Delivery Baby Boy @ 3474, Apgars 9/10 ;  Surgeon: Lahoma Crocker, MD;  Location: [facility];  Service: Obstetrics;  Laterality: N/A;  . CESAREAN SECTION N/A 02/26/2016   Procedure: CESAREAN SECTION WITH BILATERAL TUBAL LIGATION ;  Surgeon: Shelly Bombard, MD;  Location: [facility];  Service: Obstetrics;  Laterality: N/A;  . GYNECOLOGIC CRYOSURGERY    . hemmeroid       Current Outpatient Medications:  .  acetaminophen (TYLENOL) 500 MG tablet, Take 500 mg by mouth every 6 (six) hours as needed. Reported on 03/18/2016, Disp: , Rfl:  .  calcium carbonate (TUMS EX) 750 MG chewable tablet, Chew 1 tablet by mouth daily as needed for heartburn. Reported on 03/18/2016, Disp: , Rfl:  .  FeAsp-B12-FA-C-DSS-SuccAc-Zn (FERIVA 21/7) 75-1 MG TABS, Take 1 tablet by mouth daily., Disp: , Rfl:  .  ibuprofen (ADVIL,MOTRIN) 800 MG tablet, Take 1 tab p.o. q 8 hrs starting 2 days before period until period ends, monthly., Disp: 30 tablet, Rfl: 11 .  meclizine (ANTIVERT) 12.5 MG tablet, Take 1 tab p.o. q 8 hrs starting 2 days before before period until period ends, monthly., Disp: 30 tablet, Rfl: 11 .  ondansetron (ZOFRAN) 4 MG tablet, Take 1 tab q 8 hrs starting 2 days before period until period ends, monthly., Disp: 30 tablet, Rfl: 11 .  Prenatal Vit-Fe Fumarate-FA (MULTIVITAMIN-PRENATAL) 27-0.8 MG TABS tablet, Take 1 tablet by mouth daily at 12 noon., Disp: ,  Rfl:  .  triamterene-hydrochlorothiazide (DYAZIDE) 37.5-25 MG capsule, TAKE ONE CAPSULE BY MOUTH EVERY MORNING, Disp: 30 capsule, Rfl: 11 .  Fluoxetine HCl, PMDD, 20 MG CAPS, Take 1 capsule (20 mg total) by mouth daily before breakfast. (Patient not taking: Reported on 12/28/2017), Disp: 30 each, Rfl: 11 No Known Allergies  Social History   Tobacco Use  . Smoking status: Never Smoker  . Smokeless tobacco: Never Used  Substance Use Topics  . Alcohol use: Yes    Alcohol/week: 0.0 oz    Comment: occ.     Family History  Problem Relation Age of Onset  . Diabetes Maternal Grandmother   . Hypertension Maternal  Grandmother   . Cancer Maternal Grandfather   . Emphysema Maternal Grandfather   . Diabetes Paternal Grandmother   . Hypertension Paternal Grandmother   . Cancer Paternal Grandfather       Review of Systems  Constitutional: negative for fatigue and weight loss Respiratory: negative for cough and wheezing Cardiovascular: negative for chest pain, fatigue and palpitations Gastrointestinal: negative for abdominal pain and change in bowel habits Musculoskeletal:negative for myalgias Neurological: negative for gait problems and tremors Behavioral/Psych: negative for abusive relationship, depression Endocrine: negative for temperature intolerance    Genitourinary:negative for abnormal menstrual periods, genital lesions, hot flashes, sexual problems and vaginal discharge Integument/breast: negative for breast lump, breast tenderness, nipple discharge and skin lesion(s)    Objective:       BP 126/77   Pulse 82   Ht 5\' 3"  (1.6 m)   Wt 163 lb 6.4 oz (74.1 kg)   LMP 12/17/2017   BMI 28.95 kg/m  General:   alert  Skin:   no rash or abnormalities  Lungs:   clear to auscultation bilaterally  Heart:   regular rate and rhythm, S1, S2 normal, no murmur, click, rub or gallop  Breasts:   normal without suspicious masses, skin or nipple changes or axillary nodes  Abdomen:  normal findings: no organomegaly, soft, non-tender and no hernia  Pelvis:  External genitalia: normal general appearance Urinary system: urethral meatus normal and bladder without fullness, nontender Vaginal: normal without tenderness, induration or masses Cervix: normal appearance Adnexa: normal bimanual exam Uterus: anteverted and non-tender, normal size   Lab Review Urine pregnancy test Labs reviewed yes Radiologic studies reviewed no  50% of 20 min visit spent on counseling and coordination of care.   Assessment:     1. Encounter for routine gynecological examination with Papanicolaou smear of cervix Rx: -  Cytology - PAP  2. Vaginal discharge Rx: - Cervicovaginal ancillary only  3. Primary dysmenorrhea - period is heavier and more painful after getting tubal ligation - continue scheduled Ibuprofen, monthly  4. PMS (premenstrual syndrome) - stable, continue Fluoxetine     Plan:    Education reviewed: calcium supplements, depression evaluation, low fat, low cholesterol diet, safe sex/STD prevention, self breast exams and weight bearing exercise. Follow up in: 1 year.   No orders of the defined types were placed in this encounter.  No orders of the defined types were placed in this encounter.

## 2017-12-29 LAB — CERVICOVAGINAL ANCILLARY ONLY
Bacterial vaginitis: NEGATIVE
Candida vaginitis: NEGATIVE

## 2017-12-30 LAB — CYTOLOGY - PAP
DIAGNOSIS: NEGATIVE
HPV: NOT DETECTED

## 2018-01-11 ENCOUNTER — Telehealth: Payer: Self-pay | Admitting: Pediatrics

## 2018-01-11 NOTE — Telephone Encounter (Signed)
Pt called office today reporting she missed work yesterday d/t cycle.  She reports this is the second cycle this month.  She is requesting we update FMLA paperwork to reflect increased incidents to 2x/month.

## 2018-01-12 NOTE — Telephone Encounter (Signed)
Pt called in today to be sure I rec'd her message yesterday.  She states she is having 21 day cycles and will need FMLA updated to reflect such, d/t them falling within 30 days. I advised her I did rec the message and will update the paperwork asap.  Advised of 5-7 day turn around time. She voiced understanding and ask that I fax the forms when finished.

## 2018-01-14 NOTE — Telephone Encounter (Signed)
LM for patient updated FMLA forms up front for pick up and tcb with questions.

## 2018-02-11 ENCOUNTER — Telehealth: Payer: Self-pay

## 2018-02-11 NOTE — Telephone Encounter (Signed)
Patient wanted to know if we received her paperwork, and if it is completed. She will pick it up tomorrow.

## 2018-07-30 ENCOUNTER — Other Ambulatory Visit: Payer: Self-pay | Admitting: Obstetrics

## 2018-07-30 DIAGNOSIS — N944 Primary dysmenorrhea: Secondary | ICD-10-CM

## 2018-08-23 ENCOUNTER — Other Ambulatory Visit: Payer: Self-pay | Admitting: Obstetrics

## 2018-12-29 ENCOUNTER — Ambulatory Visit (INDEPENDENT_AMBULATORY_CARE_PROVIDER_SITE_OTHER): Payer: 59 | Admitting: Obstetrics

## 2018-12-29 ENCOUNTER — Encounter: Payer: Self-pay | Admitting: Obstetrics

## 2018-12-29 VITALS — BP 132/85 | HR 92 | Resp 16 | Ht 63.0 in | Wt 167.3 lb

## 2018-12-29 DIAGNOSIS — Z1239 Encounter for other screening for malignant neoplasm of breast: Secondary | ICD-10-CM

## 2018-12-29 DIAGNOSIS — Z1151 Encounter for screening for human papillomavirus (HPV): Secondary | ICD-10-CM | POA: Diagnosis not present

## 2018-12-29 DIAGNOSIS — D6801 Von willebrand disease, type 1: Secondary | ICD-10-CM

## 2018-12-29 DIAGNOSIS — Z0000 Encounter for general adult medical examination without abnormal findings: Secondary | ICD-10-CM | POA: Diagnosis not present

## 2018-12-29 DIAGNOSIS — Z124 Encounter for screening for malignant neoplasm of cervix: Secondary | ICD-10-CM | POA: Diagnosis not present

## 2018-12-29 DIAGNOSIS — Z01419 Encounter for gynecological examination (general) (routine) without abnormal findings: Secondary | ICD-10-CM | POA: Diagnosis not present

## 2018-12-29 DIAGNOSIS — N944 Primary dysmenorrhea: Secondary | ICD-10-CM

## 2018-12-29 DIAGNOSIS — D680 Von Willebrand's disease: Secondary | ICD-10-CM

## 2018-12-29 NOTE — Progress Notes (Signed)
Subjective:        Courtney [name] is a 38 y.o. female here for a routine exam.  Current complaints: None.    Personal health questionnaire:  Is patient Ashkenazi Jewish, have a family history of breast and/or ovarian cancer: no Is there a family history of uterine cancer diagnosed at age < 57, gastrointestinal cancer, urinary tract cancer, family member who is a Field seismologist syndrome-associated carrier: no Is the patient overweight and hypertensive, family history of diabetes, personal history of gestational diabetes, preeclampsia or PCOS: no Is patient over 77, have PCOS,  family history of premature CHD under age 55, diabetes, smoke, have hypertension or peripheral artery disease:  no At any time, has a partner hit, kicked or otherwise hurt or frightened you?: no Over the past 2 weeks, have you felt down, depressed or hopeless?: no Over the past 2 weeks, have you felt little interest or pleasure in doing things?:no   Gynecologic History Patient's last menstrual period was 12/12/2018 (exact date). Contraception: tubal ligation Last Pap: 2019. Results were: normal Last mammogram: n/a. Results were: n/a  Obstetric History OB History  Gravida Para Term Preterm AB Living  2 2 2     2   SAB TAB Ectopic Multiple Live Births        0 2    # Outcome Date GA Lbr Len/2nd Weight Sex Delivery Anes PTL Lv  2 Term 02/26/16 [redacted]w[redacted]d / 00:44 7 lb 13.9 oz (3.57 kg) M CS-Vac Gen  LIV  1 Term 08/17/13 [redacted]w[redacted]d 21:31 / 04:17 8 lb 4.6 oz (3.759 kg) M CS-LTranv Gen  LIV    Past Medical History:  Diagnosis Date  . Allergy   . Hypertension   . Supervision of high risk pregnancy in third trimester 02/04/2016  . UTI (lower urinary tract infection)   . Von Willebrand disease ([place])   . Von Willebrand disease type IA ([place]) 02/04/2016    Past Surgical History:  Procedure Laterality Date  . CESAREAN SECTION N/A 08/17/2013   Procedure: Primary Cesarean Section Delivery Baby Boy @ 1660, Apgars 9/10 ;   Surgeon: Lahoma Crocker, MD;  Location: [facility];  Service: Obstetrics;  Laterality: N/A;  . CESAREAN SECTION N/A 02/26/2016   Procedure: CESAREAN SECTION WITH BILATERAL TUBAL LIGATION ;  Surgeon: Shelly Bombard, MD;  Location: [facility];  Service: Obstetrics;  Laterality: N/A;  . GYNECOLOGIC CRYOSURGERY    . hemmeroid       Current Outpatient Medications:  .  ibuprofen (ADVIL,MOTRIN) 800 MG tablet, TAKE 1 TABLET BY MOUTH EVERY 8 HOURS ** STARTING 2 DAYS BEFORE PERIOD UNTIL PERIOD ENDS, MONTHLY., Disp: 30 tablet, Rfl: 11 .  meclizine (ANTIVERT) 12.5 MG tablet, Take 1 tab p.o. q 8 hrs starting 2 days before before period until period ends, monthly., Disp: 30 tablet, Rfl: 11 .  Multiple Vitamins-Minerals (WOMENS MULTIVITAMIN PO), Take by mouth., Disp: , Rfl:  .  ondansetron (ZOFRAN) 4 MG tablet, Take 1 tab q 8 hrs starting 2 days before period until period ends, monthly., Disp: 30 tablet, Rfl: 11 .  triamterene-hydrochlorothiazide (DYAZIDE) 37.5-25 MG capsule, TAKE 1 CAPSULE BY MOUTH EVERY DAY IN THE MORNING, Disp: 90 capsule, Rfl: 3 No Known Allergies  Social History   Tobacco Use  . Smoking status: Never Smoker  . Smokeless tobacco: Never Used  Substance Use Topics  . Alcohol use: Yes    Alcohol/week: 0.0 standard drinks    Comment: occ.     Family History  Problem Relation Age  of Onset  . Diabetes Maternal Grandmother   . Hypertension Maternal Grandmother   . Cancer Maternal Grandfather   . Emphysema Maternal Grandfather   . Diabetes Paternal Grandmother   . Hypertension Paternal Grandmother   . Cancer Paternal Grandfather       Review of Systems  Constitutional: negative for fatigue and weight loss Respiratory: negative for cough and wheezing Cardiovascular: negative for chest pain, fatigue and palpitations Gastrointestinal: negative for abdominal pain and change in bowel habits Musculoskeletal:negative for myalgias Neurological: negative for gait problems and  tremors Behavioral/Psych: negative for abusive relationship, depression Endocrine: negative for temperature intolerance    Genitourinary:negative for abnormal menstrual periods, genital lesions, hot flashes, sexual problems and vaginal discharge Integument/breast: negative for breast lump, breast tenderness, nipple discharge and skin lesion(s)    Objective:       BP 132/85 (BP Location: Right Arm, Patient Position: Sitting, Cuff Size: Normal)   Pulse 92   Resp 16   Ht 5\' 3"  (1.6 m)   Wt 167 lb 4.8 oz (75.9 kg)   LMP 12/12/2018 (Exact Date)   BMI 29.64 kg/m  General:   alert  Skin:   no rash or abnormalities  Lungs:   clear to auscultation bilaterally  Heart:   regular rate and rhythm, S1, S2 normal, no murmur, click, rub or gallop  Breasts:   normal without suspicious masses, skin or nipple changes or axillary nodes  Abdomen:  normal findings: no organomegaly, soft, non-tender and no hernia  Pelvis:  External genitalia: normal general appearance Urinary system: urethral meatus normal and bladder without fullness, nontender Vaginal: normal without tenderness, induration or masses Cervix: normal appearance Adnexa: normal bimanual exam Uterus: anteverted and non-tender, normal size   Lab Review Urine pregnancy test Labs reviewed yes Radiologic studies reviewed yes  50% of 20 min visit spent on counseling and coordination of care.   Assessment:     1. Encounter for routine gynecological examination with Papanicolaou smear of cervix Rx: - Cytology - PAP( [facility])  2. Primary dysmenorrhea - stable on Ibuprofen  3. Screening breast examination Rx: - MM 3D SCREEN BREAST BILATERAL; Future  4. Routine adult health maintenance Rx: - Ambulatory referral to Internal Medicine  5. Von Willebrand disease type IA ([place]) - stable    Plan:    Education reviewed: calcium supplements, depression evaluation, low fat, low cholesterol diet, safe sex/STD prevention, self breast  exams and weight bearing exercise. Mammogram ordered. Follow up in: 1 year.   No orders of the defined types were placed in this encounter.  Orders Placed This Encounter  Procedures  . MM 3D SCREEN BREAST BILATERAL    INS [facility] PF NONE/BASELINE NO PROBLEMS / NO FAMILY HX OF BR CA / NO IMPLANTS / NO NEEDS / TOMO / SW ANEETRA @ [facility] / JR    Standing Status:   Future    Standing Expiration Date:   02/27/2020    Order Specific Question:   Reason for Exam (SYMPTOM  OR DIAGNOSIS REQUIRED)    Answer:   Screening    Order Specific Question:   Is the patient pregnant?    Answer:   No    Order Specific Question:   Preferred imaging location?    Answer:   [facility]  . Ambulatory referral to Internal Medicine    Referral Priority:   Routine    Referral Type:   Consultation    Referral Reason:   Specialty Services Required    Requested  Specialty:   Internal Medicine    Number of Visits Requested:   1    Shelly Bombard MD 12-29-2018

## 2018-12-31 LAB — CYTOLOGY - PAP
DIAGNOSIS: NEGATIVE
HPV (WINDOPATH): NOT DETECTED

## 2019-01-26 ENCOUNTER — Ambulatory Visit
Admission: RE | Admit: 2019-01-26 | Discharge: 2019-01-26 | Disposition: A | Discharge status: Home / Self Care | Payer: 59 | Source: Ambulatory Visit | Attending: Obstetrics | Admitting: Obstetrics

## 2019-01-26 DIAGNOSIS — Z1239 Encounter for other screening for malignant neoplasm of breast: Secondary | ICD-10-CM

## 2019-01-29 ENCOUNTER — Other Ambulatory Visit: Payer: Self-pay | Admitting: Obstetrics

## 2019-01-29 DIAGNOSIS — R110 Nausea: Secondary | ICD-10-CM

## 2019-05-06 NOTE — Progress Notes (Signed)
TELEHEALTH VISIT  Referring Provider: Tisovec, Fransico Him, MD Primary Care Physician:  Haywood Pao, MD   Tele-visit due to COVID-19 pandemic Patient requested visit virtually, consented to the virtual encounter via video enabled telemedicine application (failed attempt at [place], switched to [place]) Contact made at: 13:30 05/11/19 Patient verified by name and date of birth Location of patient: Home Location provider: [place] [place] [facility] Names of persons participating: Me, patient, Tinnie Gens CMA Time spent on telehealth visit: 32 minutes I discussed the limitations of evaluation and management by telemedicine. The patient expressed understanding and agreed to proceed.  Reason for Consultation:  Anemia   IMPRESSION:  Iron deficiency anemia without overt or known occult GI blood loss Von Willebrand's disease Dysmenorrhea - worse following her BTL now that she is off OCPs  Must consider a concurrent GI blood loss associated etiology to her iron deficiency beyond dysmenorrhea. EGD and colonoscopy recommended. Will review necessary preprocedure preparation with Dr. Beryle Beams, as she likely needs DDAVP +/- von Willebrand factor.     PLAN: EGD and colonoscopy after preprocedure treatment requirements confirmed with Dr. Beryle Beams  I consented the patient at the bedside today discussing the risks, benefits, and alternatives to endoscopic evaluation. In particular, we discussed the risks that include, but are not limited to, reaction to medication, cardiopulmonary compromise, bleeding requiring blood transfusion, aspiration resulting in pneumonia, perforation requiring surgery, lack of diagnosis, severe illness requiring hospitalization, and even death. We reviewed the risk of missed lesion including polyps or even cancer. She knows that I will review the procedure with Dr. Beryle Beams to determine what pretreatment may be necessary.  The patient acknowledges these risks and  asks that we proceed.   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 Osborne Casco for further evaluation of anemia. Originally from the [place]. The history is obtained through the patient and referring records provided by Dr. Erskin Burnet. She has hypertension, Von Willebrand's disease, migraines, periodic tachycardia with stress in college and recent dysmenorrhea.  Followed by Dr. Beryle Beams for the Von Willebrand's.   Recently established care with Dr. Osborne Casco and was diagnosed with iron deficiency anemia on routine labs.   History of heavy menses that she attributes to Von Willebrand's. Previously controlled on OCP. Following her BTL, she has had worsened menses since that time and has 1-2 more days of menses. Mother and sister with similar problems.   She resumed iron supplements after the anemia was identified. She is not tolerating the iron supplements. Note darks brown stools since starting iron.   No symptoms associated with the anemia. Chronic fatigue that she attributes to two children under 6 including an autistic 82 year old.  No weakness, headache, irritability, exercise intolerance, exertional dyspnea, vertigo, or angina pectoris.  No pica.  No beeturia.  No hearing loss.    No overt GI blood loss. No melena, hematochezia, bright red blood per rectum. No epistaxis, hemoptysis, or hematuria.   No other identified exacerbating or relieving features.  Labs from 04/20/19 show hemoglobin 9.3, MCV 68.8, RDW 16.2. Normal CMP. Iron 13, percent saturation 3%, TIBC 414.   No prior endoscopic evaluation.   Maternal uncle with colon cancer. No other known family history of colon cancer or polyps. No family history of uterine/endometrial cancer, pancreatic cancer or gastric/stomach cancer.  Past Medical History:  Diagnosis Date   Allergy    Hypertension    Supervision of high risk pregnancy in third trimester 02/04/2016   UTI (lower  urinary tract  infection)    Von Willebrand disease ([place])    Von Willebrand disease type IA ([place]) 02/04/2016    Past Surgical History:  Procedure Laterality Date   CESAREAN SECTION N/A 08/17/2013   Procedure: Primary Cesarean Section Delivery Baby Boy @ 3662, Apgars 9/10 ;  Surgeon: Lahoma Crocker, MD;  Location: [facility];  Service: Obstetrics;  Laterality: N/A;   CESAREAN SECTION N/A 02/26/2016   Procedure: CESAREAN SECTION WITH BILATERAL TUBAL LIGATION ;  Surgeon: Shelly Bombard, MD;  Location: [facility];  Service: Obstetrics;  Laterality: N/A;   GYNECOLOGIC CRYOSURGERY     hemmeroid      Current Outpatient Medications  Medication Sig Dispense Refill   ferrous sulfate 325 (65 FE) MG tablet Take 325 mg by mouth daily with breakfast.     ibuprofen (ADVIL,MOTRIN) 800 MG tablet TAKE 1 TABLET BY MOUTH EVERY 8 HOURS ** STARTING 2 DAYS BEFORE PERIOD UNTIL PERIOD ENDS, MONTHLY. 30 tablet 11   meclizine (ANTIVERT) 12.5 MG tablet Take 1 tab p.o. q 8 hrs starting 2 days before before period until period ends, monthly. 30 tablet 11   Multiple Vitamins-Minerals (WOMENS MULTIVITAMIN PO) Take by mouth.     ondansetron (ZOFRAN) 4 MG tablet TAKE 1 TABLET EVERY 8 HOURS STARTING 2 DAYS BEFORE PERIOD UNTIL PERIOD ENDS MONTHLY 30 tablet 5   triamterene-hydrochlorothiazide (DYAZIDE) 37.5-25 MG capsule TAKE 1 CAPSULE BY MOUTH EVERY DAY IN THE MORNING 90 capsule 3   No current facility-administered medications for this visit.     Allergies as of 05/11/2019   (No Known Allergies)    Family History  Problem Relation Age of Onset   Multiple sclerosis Mother    Diabetes Maternal Grandmother    Hypertension Maternal Grandmother    Cancer Maternal Grandfather    Emphysema Maternal Grandfather    Diabetes Paternal Grandmother    Hypertension Paternal Grandmother    Cancer Paternal Grandfather    Colon cancer Maternal Uncle    Liver cancer Neg Hx    Pancreatic cancer Neg Hx    Rectal cancer Neg  Hx    Stomach cancer Neg Hx     Social History   Socioeconomic History   Marital status: Married    Spouse name: Papijonia   Number of children: 2   Years of education: 16   Highest education level: Not on file  Occupational History   Occupation: Therapist, art rep  Social Needs   Financial resource strain: Not on file   Food insecurity:    Worry: Not on file    Inability: Not on file   Transportation needs:    Medical: Not on file    Non-medical: Not on file  Tobacco Use   Smoking status: Never Smoker   Smokeless tobacco: Never Used  Substance and Sexual Activity   Alcohol use: Yes    Alcohol/week: 0.0 standard drinks    Comment: occ.    Drug use: No   Sexual activity: Yes    Partners: Male    Birth control/protection: Surgical  Lifestyle   Physical activity:    Days per week: Not on file    Minutes per session: Not on file   Stress: Not on file  Relationships   Social connections:    Talks on phone: Not on file    Gets together: Not on file    Attends religious service: Not on file    Active member of club or organization: Not on file  Attends meetings of clubs or organizations: Not on file    Relationship status: Not on file   Intimate partner violence:    Fear of current or ex partner: Not on file    Emotionally abused: Not on file    Physically abused: Not on file    Forced sexual activity: Not on file  Other Topics Concern   Not on file  Social History Narrative   Fun: play with her child   Denies religious beliefs that would effect health care.     Review of Systems: ALL ROS discussed and all others negative except listed in HPI.  Physical Exam: General: in no acute distress Neuro: Alert and appropriate Psych: Normal affect and normal insight   Kaena Santori L. Tarri Glenn, MD, MPH [facility] 05/11/2019, 2:43 PM

## 2019-05-10 ENCOUNTER — Encounter: Payer: Self-pay | Admitting: General Surgery

## 2019-05-11 ENCOUNTER — Other Ambulatory Visit: Payer: Self-pay

## 2019-05-11 ENCOUNTER — Encounter: Payer: Self-pay | Admitting: Gastroenterology

## 2019-05-11 ENCOUNTER — Ambulatory Visit (INDEPENDENT_AMBULATORY_CARE_PROVIDER_SITE_OTHER): Payer: 59 | Admitting: Gastroenterology

## 2019-05-11 VITALS — Ht 63.0 in | Wt 168.0 lb

## 2019-05-11 DIAGNOSIS — D509 Iron deficiency anemia, unspecified: Secondary | ICD-10-CM

## 2019-05-12 ENCOUNTER — Telehealth: Payer: Self-pay | Admitting: Emergency Medicine

## 2019-05-12 NOTE — Telephone Encounter (Signed)
Dr. Beryle Beams,   Dr. Tarri Glenn would like to schedule this patient for an endoscopy and colonoscopy. Due to her history of Von Willebrand's factor, please coordinate patient to have DDAVP +/- and let us know when it is ok to schedule her for her procedures.

## 2019-05-13 ENCOUNTER — Other Ambulatory Visit: Payer: Self-pay | Admitting: *Deleted

## 2019-05-13 ENCOUNTER — Telehealth: Payer: Self-pay | Admitting: Gastroenterology

## 2019-05-13 NOTE — Telephone Encounter (Signed)
Spoke to the patient and requested more information about the Iberet medication that she previous took. The patient reported to this RN that she took that medication when she lived in the [place] approximately 20 years ago. This RN also called the pharmacy to see if this was a prescription medication or an OTC medication and the pharmacy tech could only find the medication on Google as if it was not available. This RN told the patient that because the medication was prescribed by her PCP because of anemia due to Von Willebrand disease, she needed to reach out to her PCP for an alternate iron supplement because the one that she was on years ago does not look as if it is available.

## 2019-05-13 NOTE — Telephone Encounter (Signed)
Spoke to the patient who reports she was prescribed ferrous sulfate 325 mg daily by her PCP. She endorses burning, vomiting and abd pain after taking the medication, even with a substantial amount of food. The patient has been on this medication for the past 2.5 weeks and can no longer tolerate the medication. The patient forgot to mention these symptoms during her recent visit. The patient states that when she called her PCP's office to change the medication, they directed her to call GI to see what iron med she could take that would not cause abd discomfort but would also deliver enough iron. The patient stated she took Iberet 500 and tolerated this medication. Please advise.

## 2019-05-13 NOTE — Telephone Encounter (Signed)
Please have her resume the prior formulation that worked. Thank you.

## 2019-05-17 ENCOUNTER — Telehealth: Payer: Self-pay

## 2019-05-17 NOTE — Telephone Encounter (Signed)
TC to pt regarding vm left on triage line Pt was not ava left detailed message for pt to return call to office (current time 4:06pm)

## 2019-05-18 ENCOUNTER — Other Ambulatory Visit: Payer: Self-pay | Admitting: Obstetrics

## 2019-05-18 DIAGNOSIS — D500 Iron deficiency anemia secondary to blood loss (chronic): Secondary | ICD-10-CM

## 2019-05-18 MED ORDER — FERRALET 90 90-1 MG PO TABS: 1.0000 | ORAL_TABLET | Freq: Every day | ORAL | 11 refills | Status: DC

## 2019-06-09 ENCOUNTER — Encounter: Payer: Self-pay | Admitting: Emergency Medicine

## 2019-08-21 ENCOUNTER — Other Ambulatory Visit: Payer: Self-pay | Admitting: Obstetrics

## 2019-08-21 DIAGNOSIS — N944 Primary dysmenorrhea: Secondary | ICD-10-CM

## 2019-09-14 ENCOUNTER — Other Ambulatory Visit: Payer: Self-pay | Admitting: Obstetrics

## 2019-11-18 ENCOUNTER — Ambulatory Visit (INDEPENDENT_AMBULATORY_CARE_PROVIDER_SITE_OTHER): Payer: 59 | Admitting: Obstetrics

## 2019-11-18 ENCOUNTER — Encounter: Payer: Self-pay | Admitting: Obstetrics

## 2019-11-18 ENCOUNTER — Other Ambulatory Visit: Payer: Self-pay

## 2019-11-18 ENCOUNTER — Ambulatory Visit: Payer: 59 | Admitting: Obstetrics

## 2019-11-18 VITALS — BP 143/90 | HR 99 | Wt 175.0 lb

## 2019-11-18 DIAGNOSIS — N898 Other specified noninflammatory disorders of vagina: Secondary | ICD-10-CM | POA: Diagnosis not present

## 2019-11-18 DIAGNOSIS — Z113 Encounter for screening for infections with a predominantly sexual mode of transmission: Secondary | ICD-10-CM

## 2019-11-18 NOTE — Progress Notes (Signed)
Pt presents for GYN problem visit today c/o Possible BV Infection.  LMP: 11/16/19

## 2019-11-18 NOTE — Progress Notes (Signed)
Patient ID: Courtney [name], female   DOB: October 15, 1981, 38 y.o.   MRN: [id number]  Chief Complaint  Patient presents with  . Vaginitis    HPI Courtney [name] is a 38 y.o. female.  Malodorous vaginal discharge. HPI  Past Medical History:  Diagnosis Date  . Allergy   . Hypertension   . Supervision of high risk pregnancy in third trimester 02/04/2016  . UTI (lower urinary tract infection)   . Von Willebrand disease ([place])   . Von Willebrand disease type IA ([place]) 02/04/2016    Past Surgical History:  Procedure Laterality Date  . CESAREAN SECTION N/A 08/17/2013   Procedure: Primary Cesarean Section Delivery Baby Boy @ [id number], Apgars 9/10 ;  Surgeon: Lahoma Crocker, MD;  Location: [facility];  Service: Obstetrics;  Laterality: N/A;  . CESAREAN SECTION N/A 02/26/2016   Procedure: CESAREAN SECTION WITH BILATERAL TUBAL LIGATION ;  Surgeon: Shelly Bombard, MD;  Location: [facility];  Service: Obstetrics;  Laterality: N/A;  . GYNECOLOGIC CRYOSURGERY    . hemmeroid      Family History  Problem Relation Age of Onset  . Multiple sclerosis Mother   . Diabetes Maternal Grandmother   . Hypertension Maternal Grandmother   . Cancer Maternal Grandfather   . Emphysema Maternal Grandfather   . Diabetes Paternal Grandmother   . Hypertension Paternal Grandmother   . Cancer Paternal Grandfather   . Colon cancer Maternal Uncle   . Liver cancer Neg Hx   . Pancreatic cancer Neg Hx   . Rectal cancer Neg Hx   . Stomach cancer Neg Hx     Social History Social History   Tobacco Use  . Smoking status: Never Smoker  . Smokeless tobacco: Never Used  Substance Use Topics  . Alcohol use: Yes    Alcohol/week: 0.0 standard drinks    Comment: occ.   . Drug use: No    No Known Allergies  Current Outpatient Medications  Medication Sig Dispense Refill  . Fe Cbn-Fe Gluc-FA-B12-C-DSS (FERRALET 90) 90-1 MG TABS Take 1 tablet by mouth daily before breakfast. 30 each 11  . ferrous sulfate 325 (65 FE)  MG tablet Take 325 mg by mouth daily with breakfast.    . ibuprofen (ADVIL) 800 MG tablet TAKE 1 TABLET BY MOUTH EVERY 8 HOURS ** STARTING 2 DAYS BEFORE PERIOD UNTIL PERIOD ENDS, MONTHLY. 30 tablet 11  . meclizine (ANTIVERT) 12.5 MG tablet Take 1 tab p.o. q 8 hrs starting 2 days before before period until period ends, monthly. 30 tablet 11  . Multiple Vitamins-Minerals (WOMENS MULTIVITAMIN PO) Take by mouth.    . ondansetron (ZOFRAN) 4 MG tablet TAKE 1 TABLET EVERY 8 HOURS STARTING 2 DAYS BEFORE PERIOD UNTIL PERIOD ENDS MONTHLY 30 tablet 5  . triamterene-hydrochlorothiazide (DYAZIDE) 37.5-25 MG capsule TAKE 1 CAPSULE BY MOUTH EVERY DAY IN THE MORNING 90 capsule 3   No current facility-administered medications for this visit.     Review of Systems Review of Systems Constitutional: negative for fatigue and weight loss Respiratory: negative for cough and wheezing Cardiovascular: negative for chest pain, fatigue and palpitations Gastrointestinal: negative for abdominal pain and change in bowel habits Genitourinary:negative Integument/breast: negative for nipple discharge Musculoskeletal:negative for myalgias Neurological: negative for gait problems and tremors Behavioral/Psych: negative for abusive relationship, depression Endocrine: negative for temperature intolerance      Blood pressure (!) 143/90, pulse 99, weight 175 lb (79.4 kg), last menstrual period 11/16/2019.  Physical Exam Physical Exam General:   alert  Skin:  no rash or abnormalities  Lungs:   clear to auscultation bilaterally  Heart:   regular rate and rhythm, S1, S2 normal, no murmur, click, rub or gallop  Breasts:   normal without suspicious masses, skin or nipple changes or axillary nodes  Abdomen:  normal findings: no organomegaly, soft, non-tender and no hernia  Pelvis:  External genitalia: normal general appearance Urinary system: urethral meatus normal and bladder without fullness, nontender Vaginal: normal  without tenderness, induration or masses Cervix: normal appearance Adnexa: normal bimanual exam Uterus: anteverted and non-tender, normal size    50% of 15 min visit spent on counseling and coordination of care.   Data Reviewed Labs  Assessment     1. Vaginal discharge Rx: - Cervicovaginal ancillary only( [place])    Plan    Follow up prn  No orders of the defined types were placed in this encounter.  No orders of the defined types were placed in this encounter.   Shelly Bombard, MD 11/18/2019 12:14 PM

## 2019-11-21 LAB — CERVICOVAGINAL ANCILLARY ONLY
Bacterial Vaginitis (gardnerella): NEGATIVE
Candida Glabrata: NEGATIVE
Candida Vaginitis: NEGATIVE
Chlamydia: NEGATIVE
Comment: NEGATIVE
Comment: NEGATIVE
Comment: NEGATIVE
Comment: NEGATIVE
Comment: NEGATIVE
Comment: NORMAL
Neisseria Gonorrhea: NEGATIVE
Trichomonas: NEGATIVE

## 2020-02-17 ENCOUNTER — Other Ambulatory Visit: Payer: Self-pay

## 2020-02-17 ENCOUNTER — Encounter: Payer: Self-pay | Admitting: Obstetrics

## 2020-02-17 ENCOUNTER — Ambulatory Visit (INDEPENDENT_AMBULATORY_CARE_PROVIDER_SITE_OTHER): Payer: BC Managed Care – PPO | Admitting: Obstetrics

## 2020-02-17 VITALS — BP 128/83 | HR 80 | Ht 63.0 in | Wt 183.4 lb

## 2020-02-17 DIAGNOSIS — D500 Iron deficiency anemia secondary to blood loss (chronic): Secondary | ICD-10-CM | POA: Diagnosis not present

## 2020-02-17 DIAGNOSIS — N898 Other specified noninflammatory disorders of vagina: Secondary | ICD-10-CM | POA: Diagnosis not present

## 2020-02-17 DIAGNOSIS — Z1151 Encounter for screening for human papillomavirus (HPV): Secondary | ICD-10-CM | POA: Diagnosis not present

## 2020-02-17 DIAGNOSIS — Z1239 Encounter for other screening for malignant neoplasm of breast: Secondary | ICD-10-CM

## 2020-02-17 DIAGNOSIS — N944 Primary dysmenorrhea: Secondary | ICD-10-CM

## 2020-02-17 DIAGNOSIS — Z01419 Encounter for gynecological examination (general) (routine) without abnormal findings: Secondary | ICD-10-CM

## 2020-02-17 DIAGNOSIS — Z124 Encounter for screening for malignant neoplasm of cervix: Secondary | ICD-10-CM | POA: Diagnosis not present

## 2020-02-17 DIAGNOSIS — R42 Dizziness and giddiness: Secondary | ICD-10-CM

## 2020-02-17 DIAGNOSIS — I10 Essential (primary) hypertension: Secondary | ICD-10-CM

## 2020-02-17 MED ORDER — MECLIZINE HCL 12.5 MG PO TABS: 12.5000 mg | ORAL_TABLET | Freq: Three times a day (TID) | ORAL | 5 refills | Status: DC | PRN

## 2020-02-17 MED ORDER — IBUPROFEN 800 MG PO TABS: ORAL_TABLET | ORAL | 11 refills | Status: DC

## 2020-02-17 MED ORDER — TRIAMTERENE-HCTZ 37.5-25 MG PO CAPS: ORAL_CAPSULE | ORAL | 3 refills | Status: AC

## 2020-02-17 MED ORDER — FERRALET 90 90-1 MG PO TABS: 1.0000 | ORAL_TABLET | Freq: Every day | ORAL | 3 refills | Status: DC

## 2020-02-17 NOTE — Progress Notes (Signed)
Patient presents for AEX. Patient has no concerns today.  Last Pap: 12/29/2018 Normal Last MM: 01/2019 Dense Breast LMP: 01/26/20

## 2020-02-17 NOTE — Progress Notes (Signed)
Subjective:        Courtney [name] is a 39 y.o. female here for a routine exam.  Current complaints: none.    Personal health questionnaire:  Is patient Ashkenazi Jewish, have a family history of breast and/or ovarian cancer: no Is there a family history of uterine cancer diagnosed at age < 69, gastrointestinal cancer, urinary tract cancer, family member who is a Field seismologist syndrome-associated carrier: no Is the patient overweight and hypertensive, family history of diabetes, personal history of gestational diabetes, preeclampsia or PCOS: yes Is patient over 3, have PCOS,  family history of premature CHD under age 26, diabetes, smoke, have hypertension or peripheral artery disease:  yes At any time, has a partner hit, kicked or otherwise hurt or frightened you?: no Over the past 2 weeks, have you felt down, depressed or hopeless?: no Over the past 2 weeks, have you felt little interest or pleasure in doing things?:no   Gynecologic History Patient's last menstrual period was 01/26/2020. Contraception: tubal ligation Last Pap: 12-29-2018. Results were: normal Last mammogram: 01-26-2019. Results were: normal  Obstetric History OB History  Gravida Para Term Preterm AB Living  2 2 2     2   SAB TAB Ectopic Multiple Live Births        0 2    # Outcome Date GA Lbr Len/2nd Weight Sex Delivery Anes PTL Lv  2 Term 02/26/16 [redacted]w[redacted]d / 00:44 7 lb 13.9 oz (3.57 kg) M CS-Vac Gen  LIV  1 Term 08/17/13 [redacted]w[redacted]d 21:31 / 04:17 8 lb 4.6 oz (3.759 kg) M CS-LTranv Gen  LIV    Past Medical History:  Diagnosis Date  . Allergy   . Hypertension   . Supervision of high risk pregnancy in third trimester 02/04/2016  . UTI (lower urinary tract infection)   . Von Willebrand disease ([place])   . Von Willebrand disease type IA ([place]) 02/04/2016    Past Surgical History:  Procedure Laterality Date  . CESAREAN SECTION N/A 08/17/2013   Procedure: Primary Cesarean Section Delivery Baby Boy @ [id number], Apgars 9/10 ;   Surgeon: Lahoma Crocker, MD;  Location: [facility];  Service: Obstetrics;  Laterality: N/A;  . CESAREAN SECTION N/A 02/26/2016   Procedure: CESAREAN SECTION WITH BILATERAL TUBAL LIGATION ;  Surgeon: Shelly Bombard, MD;  Location: [facility];  Service: Obstetrics;  Laterality: N/A;  . GYNECOLOGIC CRYOSURGERY    . hemmeroid       Current Outpatient Medications:  .  Fe Cbn-Fe Gluc-FA-B12-C-DSS (FERRALET 90) 90-1 MG TABS, Take 1 tablet by mouth daily before breakfast., Disp: 90 tablet, Rfl: 3 .  ferrous sulfate 325 (65 FE) MG tablet, Take 325 mg by mouth daily with breakfast., Disp: , Rfl:  .  ibuprofen (ADVIL) 800 MG tablet, Take 1 tablet po q 8 hrs, starting 2 days before period until the end of period, monthly,, Disp: 60 tablet, Rfl: 11 .  meclizine (ANTIVERT) 12.5 MG tablet, Take 1 tablet (12.5 mg total) by mouth 3 (three) times daily as needed for dizziness., Disp: 60 tablet, Rfl: 5 .  Multiple Vitamins-Minerals (WOMENS MULTIVITAMIN PO), Take by mouth., Disp: , Rfl:  .  ondansetron (ZOFRAN) 4 MG tablet, TAKE 1 TABLET EVERY 8 HOURS STARTING 2 DAYS BEFORE PERIOD UNTIL PERIOD ENDS MONTHLY, Disp: 30 tablet, Rfl: 5 .  triamterene-hydrochlorothiazide (DYAZIDE) 37.5-25 MG capsule, TAKE 1 CAPSULE BY MOUTH EVERY DAY IN THE MORNING, Disp: 90 capsule, Rfl: 3 No Known Allergies  Social History   Tobacco Use  .  Smoking status: Never Smoker  . Smokeless tobacco: Never Used  Substance Use Topics  . Alcohol use: Yes    Alcohol/week: 0.0 standard drinks    Comment: occ.     Family History  Problem Relation Age of Onset  . Multiple sclerosis Mother   . Diabetes Maternal Grandmother   . Hypertension Maternal Grandmother   . Cancer Maternal Grandfather   . Emphysema Maternal Grandfather   . Diabetes Paternal Grandmother   . Hypertension Paternal Grandmother   . Cancer Paternal Grandfather   . Colon cancer Maternal Uncle   . Liver cancer Neg Hx   . Pancreatic cancer Neg Hx   . Rectal cancer Neg Hx    . Stomach cancer Neg Hx       Review of Systems  Constitutional: negative for fatigue and weight loss Respiratory: negative for cough and wheezing Cardiovascular: negative for chest pain, fatigue and palpitations Gastrointestinal: negative for abdominal pain and change in bowel habits Musculoskeletal:negative for myalgias Neurological: negative for gait problems and tremors Behavioral/Psych: negative for abusive relationship, depression Endocrine: negative for temperature intolerance    Genitourinary:negative for abnormal menstrual periods, genital lesions, hot flashes, sexual problems and vaginal discharge Integument/breast: negative for breast lump, breast tenderness, nipple discharge and skin lesion(s)    Objective:       BP 128/83   Pulse 80   Ht 5\' 3"  (1.6 m)   Wt 183 lb 6.4 oz (83.2 kg)   LMP 01/26/2020   BMI 32.49 kg/m  General:   alert  Skin:   no rash or abnormalities  Lungs:   clear to auscultation bilaterally  Heart:   regular rate and rhythm, S1, S2 normal, no murmur, click, rub or gallop  Breasts:   normal without suspicious masses, skin or nipple changes or axillary nodes  Abdomen:  normal findings: no organomegaly, soft, non-tender and no hernia  Pelvis:  External genitalia: normal general appearance Urinary system: urethral meatus normal and bladder without fullness, nontender Vaginal: normal without tenderness, induration or masses Cervix: normal appearance Adnexa: normal bimanual exam Uterus: anteverted and non-tender, normal size   Lab Review Urine pregnancy test Labs reviewed yes Radiologic studies reviewed yes  50% of 25 min visit spent on counseling and coordination of care.   Assessment:    Healthy female exam.    Plan:    Education reviewed: calcium supplements, depression evaluation, low fat, low cholesterol diet, safe sex/STD prevention, self breast exams and weight bearing exercise. Mammogram ordered. Follow up in: 1 year.   Meds  ordered this encounter  Medications  . Fe Cbn-Fe Gluc-FA-B12-C-DSS (FERRALET 90) 90-1 MG TABS    Sig: Take 1 tablet by mouth daily before breakfast.    Dispense:  90 tablet    Refill:  3  . ibuprofen (ADVIL) 800 MG tablet    Sig: Take 1 tablet po q 8 hrs, starting 2 days before period until the end of period, monthly,    Dispense:  60 tablet    Refill:  11  . meclizine (ANTIVERT) 12.5 MG tablet    Sig: Take 1 tablet (12.5 mg total) by mouth 3 (three) times daily as needed for dizziness.    Dispense:  60 tablet    Refill:  5  . triamterene-hydrochlorothiazide (DYAZIDE) 37.5-25 MG capsule    Sig: TAKE 1 CAPSULE BY MOUTH EVERY DAY IN THE MORNING    Dispense:  90 capsule    Refill:  3   Orders Placed This Encounter  Procedures  .  MM Digital Screening    Standing Status:   Future    Standing Expiration Date:   04/18/2021    Order Specific Question:   Reason for Exam (SYMPTOM  OR DIAGNOSIS REQUIRED)    Answer:   Screening    Order Specific Question:   Is the patient pregnant?    Answer:   No    Order Specific Question:   Preferred imaging location?    Answer:   [facility]    Shelly Bombard, MD 02/17/2020 11:58 AM

## 2020-02-20 LAB — CERVICOVAGINAL ANCILLARY ONLY
Bacterial Vaginitis (gardnerella): NEGATIVE
Candida Glabrata: NEGATIVE
Candida Vaginitis: NEGATIVE
Comment: NEGATIVE
Comment: NEGATIVE
Comment: NEGATIVE

## 2020-02-21 LAB — CYTOLOGY - PAP
Comment: NEGATIVE
Diagnosis: NEGATIVE
High risk HPV: NEGATIVE

## 2020-08-02 ENCOUNTER — Telehealth: Payer: Self-pay

## 2020-08-02 NOTE — Telephone Encounter (Signed)
Returned call, pt having spotting in between cycles. Transferred to scheduler for appt.

## 2020-08-06 ENCOUNTER — Ambulatory Visit: Payer: 59 | Admitting: Obstetrics

## 2020-08-06 ENCOUNTER — Encounter: Payer: Self-pay | Admitting: Obstetrics

## 2020-08-06 ENCOUNTER — Other Ambulatory Visit: Payer: Self-pay

## 2020-08-06 VITALS — BP 122/78 | Wt 180.3 lb

## 2020-08-06 DIAGNOSIS — D680 Von Willebrand's disease: Secondary | ICD-10-CM | POA: Diagnosis not present

## 2020-08-06 DIAGNOSIS — N898 Other specified noninflammatory disorders of vagina: Secondary | ICD-10-CM | POA: Diagnosis not present

## 2020-08-06 DIAGNOSIS — N939 Abnormal uterine and vaginal bleeding, unspecified: Secondary | ICD-10-CM

## 2020-08-06 DIAGNOSIS — D6801 Von willebrand disease, type 1: Secondary | ICD-10-CM

## 2020-08-06 NOTE — Progress Notes (Signed)
Patient ID: Courtney [name], female   DOB: 1981/04/21, 39 y.o.   MRN: [id number]  Chief Complaint  Patient presents with  . Vaginal Bleeding    HPI Courtney [name] is a 39 y.o. female.  Spotting for 4-5 days after period. HPI  Past Medical History:  Diagnosis Date  . Allergy   . Hypertension   . Supervision of high risk pregnancy in third trimester 02/04/2016  . UTI (lower urinary tract infection)   . Von Willebrand disease ([place])   . Von Willebrand disease type IA ([place]) 02/04/2016    Past Surgical History:  Procedure Laterality Date  . CESAREAN SECTION N/A 08/17/2013   Procedure: Primary Cesarean Section Delivery Baby Boy @ 7616, Apgars 9/10 ;  Surgeon: Lahoma Crocker, MD;  Location: [facility];  Service: Obstetrics;  Laterality: N/A;  . CESAREAN SECTION N/A 02/26/2016   Procedure: CESAREAN SECTION WITH BILATERAL TUBAL LIGATION ;  Surgeon: Shelly Bombard, MD;  Location: [facility];  Service: Obstetrics;  Laterality: N/A;  . GYNECOLOGIC CRYOSURGERY    . hemmeroid      Family History  Problem Relation Age of Onset  . Multiple sclerosis Mother   . Diabetes Maternal Grandmother   . Hypertension Maternal Grandmother   . Cancer Maternal Grandfather   . Emphysema Maternal Grandfather   . Diabetes Paternal Grandmother   . Hypertension Paternal Grandmother   . Cancer Paternal Grandfather   . Colon cancer Maternal Uncle   . Liver cancer Neg Hx   . Pancreatic cancer Neg Hx   . Rectal cancer Neg Hx   . Stomach cancer Neg Hx     Social History Social History   Tobacco Use  . Smoking status: Never Smoker  . Smokeless tobacco: Never Used  Vaping Use  . Vaping Use: Never used  Substance Use Topics  . Alcohol use: Yes    Alcohol/week: 0.0 standard drinks    Comment: occ.   . Drug use: No    No Known Allergies  Current Outpatient Medications  Medication Sig Dispense Refill  . Fe Cbn-Fe Gluc-FA-B12-C-DSS (FERRALET 90) 90-1 MG TABS Take 1 tablet by mouth daily before  breakfast. 90 tablet 3  . ferrous sulfate 325 (65 FE) MG tablet Take 325 mg by mouth daily with breakfast.    . ibuprofen (ADVIL) 800 MG tablet Take 1 tablet po q 8 hrs, starting 2 days before period until the end of period, monthly, 60 tablet 11  . Multiple Vitamins-Minerals (WOMENS MULTIVITAMIN PO) Take by mouth.    . triamterene-hydrochlorothiazide (DYAZIDE) 37.5-25 MG capsule TAKE 1 CAPSULE BY MOUTH EVERY DAY IN THE MORNING 90 capsule 3  . meclizine (ANTIVERT) 12.5 MG tablet Take 1 tablet (12.5 mg total) by mouth 3 (three) times daily as needed for dizziness. (Patient not taking: Reported on 08/06/2020) 60 tablet 5  . ondansetron (ZOFRAN) 4 MG tablet TAKE 1 TABLET EVERY 8 HOURS STARTING 2 DAYS BEFORE PERIOD UNTIL PERIOD ENDS MONTHLY (Patient not taking: Reported on 08/06/2020) 30 tablet 5   No current facility-administered medications for this visit.    Review of Systems Review of Systems Constitutional: negative for fatigue and weight loss Respiratory: negative for cough and wheezing Cardiovascular: negative for chest pain, fatigue and palpitations Gastrointestinal: negative for abdominal pain and change in bowel habits Genitourinary:positive for spotting in between periods Integument/breast: negative for nipple discharge Musculoskeletal:negative for myalgias Neurological: negative for gait problems and tremors Behavioral/Psych: negative for abusive relationship, depression Endocrine: negative for temperature intolerance  Blood pressure 122/78, weight 180 lb 4.8 oz (81.8 kg), last menstrual period 07/16/2020.  Physical Exam Physical Exam General:   alert and no distress  Skin:   no rash or abnormalities  Lungs:   clear to auscultation bilaterally  Heart:   regular rate and rhythm, S1, S2 normal, no murmur, click, rub or gallop  Breasts:   normal without suspicious masses, skin or nipple changes or axillary nodes  Abdomen:  normal findings: no organomegaly, soft, non-tender and  no hernia  Pelvis:  External genitalia: normal general appearance Urinary system: urethral meatus normal and bladder without fullness, nontender Vaginal: normal without tenderness, induration or masses Cervix: normal appearance Adnexa: normal bimanual exam Uterus: anteverted and non-tender, normal size    50% of 20 min visit spent on counseling and coordination of care.   Data Reviewed Wet Prep  Assessment      1. Abnormal uterine bleeding (AUB) Rx: - US PELVIC COMPLETE WITH TRANSVAGINAL; Future  2. Vaginal discharge Rx: - Cervicovaginal ancillary only  3. Von Willebrand disease type IA ([place]) - clinically stable  Plan   Follow up in 2 weeks  Orders Placed This Encounter  Procedures  . US PELVIC COMPLETE WITH TRANSVAGINAL    Standing Status:   Future    Standing Expiration Date:   08/06/2021    Order Specific Question:   Reason for Exam (SYMPTOM  OR DIAGNOSIS REQUIRED)    Answer:   AUB    Order Specific Question:   Preferred imaging location?    Answer:   [id number] Berneice Gandy, MD 08/06/2020 4:58 PM

## 2020-08-06 NOTE — Patient Instructions (Addendum)
Abnormal Uterine Bleeding Abnormal uterine bleeding means bleeding more than usual from your uterus. It can include:  Bleeding between periods.  Bleeding after sex.  Bleeding that is heavier than normal.  Periods that last longer than usual.  Bleeding after you have stopped having your period (menopause). There are many problems that may cause this. You should see a doctor for any kind of bleeding that is not normal. Treatment depends on the cause of the bleeding. Follow these instructions at home:  Watch your condition for any changes.  Do not use tampons, douche, or have sex, if your doctor tells you not to.  Change your pads often.  Get regular well-woman exams. Make sure they include a pelvic exam and cervical cancer screening.  Keep all follow-up visits as told by your doctor. This is important. Contact a doctor if:  The bleeding lasts more than one week.  You feel dizzy at times.  You feel like you are going to throw up (nauseous).  You throw up. Get help right away if:  You pass out.  You have to change pads every hour.  You have belly (abdominal) pain.  You have a fever.  You get sweaty.  You get weak.  You passing large blood clots from your vagina. Summary  Abnormal uterine bleeding means bleeding more than usual from your uterus.  There are many problems that may cause this. You should see a doctor for any kind of bleeding that is not normal.  Treatment depends on the cause of the bleeding. This information is not intended to replace advice given to you by your health care provider. Make sure you discuss any questions you have with your health care provider. Document Revised: 11/25/2016 Document Reviewed: 11/25/2016 Elsevier Patient Education  2020 [place].  Endometrial Biopsy  Endometrial biopsy is a procedure in which a tissue sample is taken from inside the uterus. The sample is taken from the endometrium, which is the lining of the  uterus. The tissue sample is then checked under a microscope to see if the tissue is normal or abnormal. This procedure helps to determine where you are in your menstrual cycle and how hormone levels are affecting the lining of the uterus. This procedure may also be used to evaluate uterine bleeding or to diagnose endometrial cancer, endometrial tuberculosis, polyps, or other inflammatory conditions. Tell a health care provider about:  Any allergies you have.  All medicines you are taking, including vitamins, herbs, eye drops, creams, and over-the-counter medicines.  Any problems you or family members have had with anesthetic medicines.  Any blood disorders you have.  Any surgeries you have had.  Any medical conditions you have.  Whether you are pregnant or may be pregnant. What are the risks? Generally, this is a safe procedure. However, problems may occur, including:  Bleeding.  Pelvic infection.  Puncture of the wall of the uterus with the biopsy device (rare). What happens before the procedure?  Keep a record of your menstrual cycles as told by your health care provider. You may need to schedule your procedure for a specific time in your cycle.  You may want to bring a sanitary pad to wear after the procedure.  Ask your health care provider about: ? Changing or stopping your regular medicines. This is especially important if you are taking diabetes medicines or blood thinners. ? Taking medicines such as aspirin and ibuprofen. These medicines can thin your blood. Do not take these medicines before your procedure if  your health care provider instructs you not to.  Plan to have someone take you home from the hospital or clinic. What happens during the procedure?  To lower your risk of infection: ? Your health care team will wash or sanitize their hands.  You will lie on an exam table with your feet and legs supported as in a pelvic exam.  Your health care provider will  insert an instrument (speculum) into your vagina to see your cervix.  Your cervix will be cleansed with an antiseptic solution.  A medicine (local anesthetic) will be used to numb the cervix.  A forceps instrument (tenaculum) will be used to hold your cervix steady for the biopsy.  A thin, rod-like instrument (uterine sound) will be inserted through your cervix to determine the length of your uterus and the location where the biopsy sample will be removed.  A thin, flexible tube (catheter) will be inserted through your cervix and into the uterus. The catheter will be used to collect the biopsy sample from your endometrial tissue.  The catheter and speculum will then be removed, and the tissue sample will be sent to a lab for examination. What happens after the procedure?  You will rest in a recovery area until you are ready to go home.  You may have mild cramping and a small amount of vaginal bleeding. This is normal.  It is up to you to get the results of your procedure. Ask your health care provider, or the department that is doing the procedure, when your results will be ready. Summary  Endometrial biopsy is a procedure in which a tissue sample is taken from the endometrium, which is the lining of the uterus.  This procedure may help to diagnose menstrual cycle problems, abnormal bleeding, or other conditions affecting the endometrium.  Before the procedure, keep a record of your menstrual cycles as told by your health care provider.  The tissue sample that is removed will be checked under a microscope to see if it is normal or abnormal. This information is not intended to replace advice given to you by your health care provider. Make sure you discuss any questions you have with your health care provider. Document Revised: 11/13/2017 Document Reviewed: 12/17/2016 Elsevier Patient Education  [employer].

## 2020-08-06 NOTE — Progress Notes (Signed)
Pt. States she is spotting 3-4 days after cycles that typically last 5 days   Pt. States last cycles she was spotting 16 days after last cycle

## 2020-08-07 ENCOUNTER — Other Ambulatory Visit (HOSPITAL_COMMUNITY)
Admission: RE | Admit: 2020-08-07 | Discharge: 2020-08-07 | Disposition: A | Discharge status: Home / Self Care | Payer: 59 | Source: Ambulatory Visit | Attending: Obstetrics | Admitting: Obstetrics

## 2020-08-07 DIAGNOSIS — N898 Other specified noninflammatory disorders of vagina: Secondary | ICD-10-CM | POA: Diagnosis present

## 2020-08-07 NOTE — Addendum Note (Signed)
Addended by: Huey Bienenstock on: 08/07/2020 10:59 AM   Modules accepted: Orders

## 2020-08-08 ENCOUNTER — Other Ambulatory Visit: Payer: Self-pay | Admitting: Obstetrics

## 2020-08-08 DIAGNOSIS — N760 Acute vaginitis: Secondary | ICD-10-CM

## 2020-08-08 LAB — CERVICOVAGINAL ANCILLARY ONLY
Bacterial Vaginitis (gardnerella): POSITIVE — AB
Candida Glabrata: NEGATIVE
Candida Vaginitis: NEGATIVE
Chlamydia: NEGATIVE
Comment: NEGATIVE
Comment: NEGATIVE
Comment: NEGATIVE
Comment: NEGATIVE
Comment: NEGATIVE
Comment: NORMAL
Neisseria Gonorrhea: NEGATIVE
Trichomonas: NEGATIVE

## 2020-08-08 MED ORDER — METRONIDAZOLE 500 MG PO TABS: 500.0000 mg | ORAL_TABLET | Freq: Two times a day (BID) | ORAL | 2 refills | Status: DC

## 2020-08-15 ENCOUNTER — Other Ambulatory Visit: Payer: 59

## 2020-08-22 ENCOUNTER — Ambulatory Visit: Payer: 59 | Admitting: Obstetrics

## 2020-08-24 ENCOUNTER — Ambulatory Visit
Admission: RE | Admit: 2020-08-24 | Discharge: 2020-08-24 | Disposition: A | Discharge status: Home / Self Care | Payer: 59 | Source: Ambulatory Visit | Attending: Obstetrics | Admitting: Obstetrics

## 2020-08-24 DIAGNOSIS — N939 Abnormal uterine and vaginal bleeding, unspecified: Secondary | ICD-10-CM

## 2020-08-27 ENCOUNTER — Telehealth (INDEPENDENT_AMBULATORY_CARE_PROVIDER_SITE_OTHER): Payer: 59 | Admitting: Obstetrics

## 2020-08-27 ENCOUNTER — Encounter: Payer: Self-pay | Admitting: Obstetrics

## 2020-08-27 DIAGNOSIS — N939 Abnormal uterine and vaginal bleeding, unspecified: Secondary | ICD-10-CM | POA: Diagnosis not present

## 2020-08-27 DIAGNOSIS — D680 Von Willebrand's disease: Secondary | ICD-10-CM | POA: Diagnosis not present

## 2020-08-27 DIAGNOSIS — Z1239 Encounter for other screening for malignant neoplasm of breast: Secondary | ICD-10-CM | POA: Diagnosis not present

## 2020-08-27 DIAGNOSIS — I10 Essential (primary) hypertension: Secondary | ICD-10-CM

## 2020-08-27 DIAGNOSIS — D6801 Von willebrand disease, type 1: Secondary | ICD-10-CM

## 2020-08-27 NOTE — Progress Notes (Signed)
GYNECOLOGY VIRTUAL VISIT ENCOUNTER NOTE  Provider location: [facility] [place]   I connected with Courtney [name] on 08/27/20 at  9:00 AM EDT by MyChart Video Encounter at home and verified that I am speaking with the correct person using two identifiers.   I discussed the limitations, risks, security and privacy concerns of performing an evaluation and management service virtually and the availability of in person appointments. I also discussed with the patient that there may be a patient responsible charge related to this service. The patient expressed understanding and agreed to proceed.   History:  Courtney [name] is a 39 y.o. [id number] female being evaluated today for AUB. She denies any abnormal vaginal discharge, bleeding, pelvic pain or other concerns.       Past Medical History:  Diagnosis Date  . Allergy   . Hypertension   . Supervision of high risk pregnancy in third trimester 02/04/2016  . UTI (lower urinary tract infection)   . Von Willebrand disease ([place])   . Von Willebrand disease type IA ([place]) 02/04/2016   Past Surgical History:  Procedure Laterality Date  . CESAREAN SECTION N/A 08/17/2013   Procedure: Primary Cesarean Section Delivery Baby Boy @ 4166, Apgars 9/10 ;  Surgeon: Lahoma Crocker, MD;  Location: [facility];  Service: Obstetrics;  Laterality: N/A;  . CESAREAN SECTION N/A 02/26/2016   Procedure: CESAREAN SECTION WITH BILATERAL TUBAL LIGATION ;  Surgeon: Shelly Bombard, MD;  Location: [facility];  Service: Obstetrics;  Laterality: N/A;  . GYNECOLOGIC CRYOSURGERY    . hemmeroid     The following portions of the patient's history were reviewed and updated as appropriate: allergies, current medications, past family history, past medical history, past social history, past surgical history and problem list.   Health Maintenance:  Normal pap and negative HRHPV on 02-17-2020.  Normal mammogram on 01-26-2019.   Review of Systems:    Pertinent items noted in HPI and remainder of comprehensive ROS otherwise negative.  Physical Exam:   General:  Alert, oriented and cooperative. Patient appears to be in no acute distress.  Mental Status: Normal mood and affect. Normal behavior. Normal judgment and thought content.   Respiratory: Normal respiratory effort, no problems with respiration noted  Rest of physical exam deferred due to type of encounter  Labs and Imaging No results found for this or any previous visit (from the past 336 hour(s)). US PELVIC COMPLETE WITH TRANSVAGINAL  Result Date: 08/24/2020 CLINICAL DATA:  Abnormal uterine bleeding, LMP 08/10/2020 EXAM: TRANSABDOMINAL AND TRANSVAGINAL ULTRASOUND OF PELVIS TECHNIQUE: Both transabdominal and transvaginal ultrasound examinations of the pelvis were performed. Transabdominal technique was performed for global imaging of the pelvis including uterus, ovaries, adnexal regions, and pelvic cul-de-sac. It was necessary to proceed with endovaginal exam following the transabdominal exam to visualize the LEFT ovary. COMPARISON:  None FINDINGS: Uterus Measurements: 11.0 x 4.2 x 5.5 cm = volume: 135 mL. Anteverted. Heterogeneous myometrium. Scattered areas of shadowing, cannot exclude adenomyosis. Nabothian cysts at cervix. No focal uterine mass. Endometrium Thickness: 11 mm.  No endometrial fluid or focal abnormality Right ovary Measurements: 3.0 x 1.9 x 3.5 cm = volume: 10.6 mL. Normal morphology without mass Left ovary Measurements: 3.0 x 1.3 x 2.7 cm = volume: 5.4 mL. Normal morphology without mass Other findings No free pelvic fluid or adnexal masses. IMPRESSION: Heterogeneous myometrium with scattered areas of shadowing, cannot exclude adenomyosis. Remainder of exam unremarkable. Electronically Signed   By: Crist Infante.D.  On: 08/24/2020 18:07       Assessment and Plan:        1. Abnormal uterine bleeding (AUB) - clinically stable  2. Screening breast examination Rx: - MM  Digital Screening; Future  3. Von Willebrand disease type IA ([place]) - managed by Hematology.  Clinically stable.  4. HTN (hypertension), benign - managed by PCP.  Clinically stable.   I discussed the assessment and treatment plan with the patient. The patient was provided an opportunity to ask questions and all were answered. The patient agreed with the plan and demonstrated an understanding of the instructions.   The patient was advised to call back or seek an in-person evaluation/go to the ED if the symptoms worsen or if the condition fails to improve as anticipated.  I provided 15 minutes of face-to-face time during this encounter.   Baltazar Najjar, MD [facility] 08/27/20

## 2020-08-27 NOTE — Progress Notes (Signed)
I connected with Courtney [name] on 08/27/20 at  9:00 AM EDT by telephone and verified that I am speaking with the correct person using two identifiers.  F/u after u/s AUB

## 2021-03-23 ENCOUNTER — Other Ambulatory Visit: Payer: Self-pay | Admitting: Obstetrics

## 2021-03-23 DIAGNOSIS — I10 Essential (primary) hypertension: Secondary | ICD-10-CM

## 2021-05-17 ENCOUNTER — Ambulatory Visit (INDEPENDENT_AMBULATORY_CARE_PROVIDER_SITE_OTHER): Payer: 59 | Admitting: Obstetrics

## 2021-05-17 ENCOUNTER — Other Ambulatory Visit (HOSPITAL_COMMUNITY)
Admission: RE | Admit: 2021-05-17 | Discharge: 2021-05-17 | Disposition: A | Discharge status: Home / Self Care | Payer: 59 | Source: Ambulatory Visit | Attending: Obstetrics | Admitting: Obstetrics

## 2021-05-17 ENCOUNTER — Encounter: Payer: Self-pay | Admitting: Obstetrics

## 2021-05-17 ENCOUNTER — Other Ambulatory Visit: Payer: Self-pay

## 2021-05-17 VITALS — BP 124/82 | HR 77 | Ht 64.0 in | Wt 173.0 lb

## 2021-05-17 DIAGNOSIS — Z1239 Encounter for other screening for malignant neoplasm of breast: Secondary | ICD-10-CM | POA: Diagnosis not present

## 2021-05-17 DIAGNOSIS — B9689 Other specified bacterial agents as the cause of diseases classified elsewhere: Secondary | ICD-10-CM

## 2021-05-17 DIAGNOSIS — Z01419 Encounter for gynecological examination (general) (routine) without abnormal findings: Secondary | ICD-10-CM | POA: Insufficient documentation

## 2021-05-17 DIAGNOSIS — N760 Acute vaginitis: Secondary | ICD-10-CM | POA: Diagnosis not present

## 2021-05-17 DIAGNOSIS — N898 Other specified noninflammatory disorders of vagina: Secondary | ICD-10-CM | POA: Insufficient documentation

## 2021-05-17 DIAGNOSIS — E663 Overweight: Secondary | ICD-10-CM

## 2021-05-17 MED ORDER — METRONIDAZOLE 500 MG PO TABS: 500.0000 mg | ORAL_TABLET | Freq: Two times a day (BID) | ORAL | 2 refills | Status: DC

## 2021-05-17 NOTE — Progress Notes (Signed)
Pt is in the office for annual. Last pap 02-17-2020 Pt reports irregular menstrual cycles LMP 04-27-21 Hx of tubal

## 2021-05-17 NOTE — Progress Notes (Signed)
Subjective:        Courtney [name] is a 40 y.o. female here for a routine exam.  Current complaints: Periods are a little mor irregular.    Personal health questionnaire:  Is patient Ashkenazi Jewish, have a family history of breast and/or ovarian cancer: no Is there a family history of uterine cancer diagnosed at age < 55, gastrointestinal cancer, urinary tract cancer, family member who is a Field seismologist syndrome-associated carrier: no Is the patient overweight and hypertensive, family history of diabetes, personal history of gestational diabetes, preeclampsia or PCOS: yes Is patient over 30, have PCOS,  family history of premature CHD under age 72, diabetes, smoke, have hypertension or peripheral artery disease:  no At any time, has a partner hit, kicked or otherwise hurt or frightened you?: no Over the past 2 weeks, have you felt down, depressed or hopeless?: no Over the past 2 weeks, have you felt little interest or pleasure in doing things?:no   Gynecologic History Patient's last menstrual period was 04/27/2021. Contraception: tubal ligation Last Pap: 02-17-2020. Results were: normal Last mammogram: 2020. Results were: normal  Obstetric History OB History  Gravida Para Term Preterm AB Living  2 2 2     2   SAB IAB Ectopic Multiple Live Births        0 2    # Outcome Date GA Lbr Len/2nd Weight Sex Delivery Anes PTL Lv  2 Term 02/26/16 [redacted]w[redacted]d / 00:44 7 lb 13.9 oz (3.57 kg) M CS-Vac Gen  LIV  1 Term 08/17/13 [redacted]w[redacted]d 21:31 / 04:17 8 lb 4.6 oz (3.759 kg) M CS-LTranv Gen  LIV    Past Medical History:  Diagnosis Date  . Allergy   . Hypertension   . Supervision of high risk pregnancy in third trimester 02/04/2016  . UTI (lower urinary tract infection)   . Von Willebrand disease ([place])   . Von Willebrand disease type IA ([place]) 02/04/2016    Past Surgical History:  Procedure Laterality Date  . CESAREAN SECTION N/A 08/17/2013   Procedure: Primary Cesarean Section Delivery Baby Boy @  8768, Apgars 9/10 ;  Surgeon: Lahoma Crocker, MD;  Location: [facility];  Service: Obstetrics;  Laterality: N/A;  . CESAREAN SECTION N/A 02/26/2016   Procedure: CESAREAN SECTION WITH BILATERAL TUBAL LIGATION ;  Surgeon: Shelly Bombard, MD;  Location: [facility];  Service: Obstetrics;  Laterality: N/A;  . GYNECOLOGIC CRYOSURGERY    . hemmeroid    . TUBAL LIGATION  2017     Current Outpatient Medications:  .  ferrous sulfate 325 (65 FE) MG tablet, Take 325 mg by mouth daily with breakfast., Disp: , Rfl:  .  Multiple Vitamins-Minerals (WOMENS MULTIVITAMIN PO), Take by mouth., Disp: , Rfl:  .  triamterene-hydrochlorothiazide (DYAZIDE) 37.5-25 MG capsule, TAKE 1 CAPSULE BY MOUTH EVERY DAY IN THE MORNING, Disp: 90 capsule, Rfl: 3 .  vitamin B-12 (CYANOCOBALAMIN) 500 MCG tablet, Take 500 mcg by mouth daily., Disp: , Rfl:  .  Fe Cbn-Fe Gluc-FA-B12-C-DSS (FERRALET 90) 90-1 MG TABS, Take 1 tablet by mouth daily before breakfast., Disp: 90 tablet, Rfl: 3 .  ibuprofen (ADVIL) 800 MG tablet, Take 1 tablet po q 8 hrs, starting 2 days before period until the end of period, monthly, (Patient not taking: Reported on 05/17/2021), Disp: 60 tablet, Rfl: 11 .  meclizine (ANTIVERT) 12.5 MG tablet, Take 1 tablet (12.5 mg total) by mouth 3 (three) times daily as needed for dizziness. (Patient not taking: Reported on 08/06/2020), Disp: 60 tablet,  Rfl: 5 .  metroNIDAZOLE (FLAGYL) 500 MG tablet, Take 1 tablet (500 mg total) by mouth 2 (two) times daily. Take after a meal., Disp: 14 tablet, Rfl: 2 .  ondansetron (ZOFRAN) 4 MG tablet, TAKE 1 TABLET EVERY 8 HOURS STARTING 2 DAYS BEFORE PERIOD UNTIL PERIOD ENDS MONTHLY (Patient not taking: No sig reported), Disp: 30 tablet, Rfl: 5 No Known Allergies  Social History   Tobacco Use  . Smoking status: Never Smoker  . Smokeless tobacco: Never Used  Substance Use Topics  . Alcohol use: Yes    Alcohol/week: 0.0 standard drinks    Comment: occ.     Family History  Problem Relation  Age of Onset  . Multiple sclerosis Mother   . Diabetes Maternal Grandmother   . Hypertension Maternal Grandmother   . Cancer Maternal Grandfather   . Emphysema Maternal Grandfather   . Diabetes Paternal Grandmother   . Hypertension Paternal Grandmother   . Cancer Paternal Grandfather   . Colon cancer Maternal Uncle   . Liver cancer Neg Hx   . Pancreatic cancer Neg Hx   . Rectal cancer Neg Hx   . Stomach cancer Neg Hx       Review of Systems  Constitutional: negative for fatigue and weight loss Respiratory: negative for cough and wheezing Cardiovascular: negative for chest pain, fatigue and palpitations Gastrointestinal: negative for abdominal pain and change in bowel habits Musculoskeletal:negative for myalgias Neurological: negative for gait problems and tremors Behavioral/Psych: negative for abusive relationship, depression Endocrine: negative for temperature intolerance    Genitourinary:negative for abnormal menstrual periods, genital lesions, hot flashes, sexual problems and vaginal discharge Integument/breast: negative for breast lump, breast tenderness, nipple discharge and skin lesion(s)    Objective:       BP 124/82   Pulse 77   Ht 5\' 4"  (1.626 m)   Wt 173 lb (78.5 kg)   LMP 04/27/2021   BMI 29.70 kg/m  General:   alert  Skin:   no rash or abnormalities  Lungs:   clear to auscultation bilaterally  Heart:   regular rate and rhythm, S1, S2 normal, no murmur, click, rub or gallop  Breasts:   normal without suspicious masses, skin or nipple changes or axillary nodes  Abdomen:  normal findings: no organomegaly, soft, non-tender and no hernia  Pelvis:  External genitalia: normal general appearance Urinary system: urethral meatus normal and bladder without fullness, nontender Vaginal: normal without tenderness, induration or masses Cervix: normal appearance Adnexa: normal bimanual exam Uterus: anteverted and non-tender, normal size   Lab Review Urine pregnancy  test Labs reviewed yes Radiologic studies reviewed yes  I have spent a total of 20 minutes of face-to-face time, excluding clinical staff time, reviewing notes and preparing to see patient, ordering tests and/or medications, and counseling the patient.  Assessment:     1. Encounter for gynecological examination with Papanicolaou smear of cervix Rx: - Cytology - PAP( [place])  2. Vaginal discharge Rx: - Cervicovaginal ancillary only( [place])  3. Screening breast examination Rx: - MM Digital Screening; Future  4. BV (bacterial vaginosis) Rx: - metroNIDAZOLE (FLAGYL) 500 MG tablet; Take 1 tablet (500 mg total) by mouth 2 (two) times daily. Take after a meal.  Dispense: 14 tablet; Refill: 2  5. Overweight (BMI 25.0-29.9) - weight loss with the aid of caloric reduction, exercise and behavioral modification recommended   Plan:    Education reviewed: calcium supplements, depression evaluation, low fat, low cholesterol diet, safe sex/STD prevention, self breast  exams and weight bearing exercise. Mammogram ordered. Follow up in: 1 year.   Meds ordered this encounter  Medications  . metroNIDAZOLE (FLAGYL) 500 MG tablet    Sig: Take 1 tablet (500 mg total) by mouth 2 (two) times daily. Take after a meal.    Dispense:  14 tablet    Refill:  2   Orders Placed This Encounter  Procedures  . MM Digital Screening    Standing Status:   Future    Standing Expiration Date:   05/17/2022    Order Specific Question:   Reason for Exam (SYMPTOM  OR DIAGNOSIS REQUIRED)    Answer:   Screening    Order Specific Question:   Is the patient pregnant?    Answer:   No    Order Specific Question:   Preferred imaging location?    Answer:   [facility]    Shelly Bombard, MD 05/17/2021 1:13 PM

## 2021-05-20 LAB — CERVICOVAGINAL ANCILLARY ONLY
Bacterial Vaginitis (gardnerella): NEGATIVE
Candida Glabrata: NEGATIVE
Candida Vaginitis: NEGATIVE
Chlamydia: NEGATIVE
Comment: NEGATIVE
Comment: NEGATIVE
Comment: NEGATIVE
Comment: NEGATIVE
Comment: NEGATIVE
Comment: NORMAL
Neisseria Gonorrhea: NEGATIVE
Trichomonas: NEGATIVE

## 2021-05-20 LAB — CYTOLOGY - PAP
Comment: NEGATIVE
Diagnosis: NEGATIVE
High risk HPV: POSITIVE — AB

## 2021-05-22 ENCOUNTER — Ambulatory Visit (HOSPITAL_BASED_OUTPATIENT_CLINIC_OR_DEPARTMENT_OTHER): Payer: 59 | Admitting: Radiology

## 2021-05-23 ENCOUNTER — Other Ambulatory Visit: Payer: Self-pay

## 2021-05-23 ENCOUNTER — Ambulatory Visit (HOSPITAL_BASED_OUTPATIENT_CLINIC_OR_DEPARTMENT_OTHER)
Admission: RE | Admit: 2021-05-23 | Discharge: 2021-05-23 | Disposition: A | Discharge status: Home / Self Care | Payer: 59 | Source: Ambulatory Visit | Attending: Obstetrics | Admitting: Obstetrics

## 2021-05-23 DIAGNOSIS — Z1231 Encounter for screening mammogram for malignant neoplasm of breast: Secondary | ICD-10-CM | POA: Diagnosis present

## 2021-05-23 DIAGNOSIS — Z1239 Encounter for other screening for malignant neoplasm of breast: Secondary | ICD-10-CM

## 2021-05-27 ENCOUNTER — Other Ambulatory Visit: Payer: Self-pay | Admitting: Obstetrics

## 2021-05-27 DIAGNOSIS — R928 Other abnormal and inconclusive findings on diagnostic imaging of breast: Secondary | ICD-10-CM

## 2021-06-03 ENCOUNTER — Other Ambulatory Visit: Payer: Self-pay | Admitting: Obstetrics

## 2021-06-03 DIAGNOSIS — I10 Essential (primary) hypertension: Secondary | ICD-10-CM

## 2021-06-20 ENCOUNTER — Ambulatory Visit
Admission: RE | Admit: 2021-06-20 | Discharge: 2021-06-20 | Disposition: A | Discharge status: Home / Self Care | Payer: 59 | Source: Ambulatory Visit | Attending: Obstetrics | Admitting: Obstetrics

## 2021-06-20 ENCOUNTER — Other Ambulatory Visit: Payer: Self-pay

## 2021-06-20 DIAGNOSIS — R928 Other abnormal and inconclusive findings on diagnostic imaging of breast: Secondary | ICD-10-CM

## 2022-05-09 ENCOUNTER — Other Ambulatory Visit: Payer: Self-pay | Admitting: Obstetrics

## 2022-05-09 DIAGNOSIS — Z1231 Encounter for screening mammogram for malignant neoplasm of breast: Secondary | ICD-10-CM

## 2022-07-11 ENCOUNTER — Ambulatory Visit
Admission: RE | Admit: 2022-07-11 | Discharge: 2022-07-11 | Disposition: A | Discharge status: Home / Self Care | Payer: 59 | Source: Ambulatory Visit | Attending: Obstetrics | Admitting: Obstetrics

## 2022-07-11 ENCOUNTER — Ambulatory Visit: Payer: 59 | Admitting: Obstetrics

## 2022-07-11 DIAGNOSIS — Z1231 Encounter for screening mammogram for malignant neoplasm of breast: Secondary | ICD-10-CM

## 2022-07-15 ENCOUNTER — Other Ambulatory Visit: Payer: Self-pay | Admitting: Obstetrics

## 2022-07-15 ENCOUNTER — Ambulatory Visit: Payer: 59 | Admitting: Obstetrics and Gynecology

## 2022-07-15 DIAGNOSIS — R928 Other abnormal and inconclusive findings on diagnostic imaging of breast: Secondary | ICD-10-CM

## 2022-07-18 ENCOUNTER — Ambulatory Visit
Admission: RE | Admit: 2022-07-18 | Discharge: 2022-07-18 | Disposition: A | Discharge status: Home / Self Care | Payer: 59 | Source: Ambulatory Visit | Attending: Obstetrics | Admitting: Obstetrics

## 2022-07-18 DIAGNOSIS — R928 Other abnormal and inconclusive findings on diagnostic imaging of breast: Secondary | ICD-10-CM

## 2022-08-13 ENCOUNTER — Other Ambulatory Visit (HOSPITAL_COMMUNITY)
Admission: RE | Admit: 2022-08-13 | Discharge: 2022-08-13 | Disposition: A | Discharge status: Home / Self Care | Payer: 59 | Source: Ambulatory Visit | Attending: Obstetrics | Admitting: Obstetrics

## 2022-08-13 ENCOUNTER — Ambulatory Visit (INDEPENDENT_AMBULATORY_CARE_PROVIDER_SITE_OTHER): Payer: 59 | Admitting: Obstetrics and Gynecology

## 2022-08-13 ENCOUNTER — Encounter: Payer: Self-pay | Admitting: Obstetrics and Gynecology

## 2022-08-13 DIAGNOSIS — R87810 Cervical high risk human papillomavirus (HPV) DNA test positive: Secondary | ICD-10-CM | POA: Insufficient documentation

## 2022-08-13 DIAGNOSIS — N938 Other specified abnormal uterine and vaginal bleeding: Secondary | ICD-10-CM | POA: Diagnosis present

## 2022-08-13 DIAGNOSIS — Z01419 Encounter for gynecological examination (general) (routine) without abnormal findings: Secondary | ICD-10-CM

## 2022-08-13 NOTE — Patient Instructions (Signed)
Abnormal Uterine Bleeding Abnormal uterine bleeding means bleeding more than normal from your womb (uterus). It can include: Bleeding after sex. Bleeding between monthly (menstrual) periods. Bleeding that is heavier than normal. Monthly periods that last longer than normal. Bleeding after you have stopped having your monthly period (menopause). You should see a doctor for any kind of bleeding that is not normal. Treatment depends on the cause of your bleeding and how much you bleed. Follow these instructions at home: Medicines Take over-the-counter and prescription medicines only as told by your doctor. Ask your doctor about: Taking medicines such as aspirin and ibuprofen. Do not take these medicines unless your doctor tells you to take them. Taking over-the-counter medicines, vitamins, herbs, and supplements. You may be given iron pills. Take them as told by your doctor. Managing constipation If you take iron pills, you may need to take these actions to prevent or treat trouble pooping (constipation): Drink enough fluid to keep your pee (urine) pale yellow. Take over-the-counter or prescription medicines. Eat foods that are high in fiber. These include beans, whole grains, and fresh fruits and vegetables. Limit foods that are high in fat and sugar. These include fried or sweet foods. Activity Change your activity to decrease bleeding if you need to change your sanitary pad more than one time every 2 hours: Lie in bed with your feet raised (elevated). Place a cold pack on your lower belly. Rest as much as you are able until the bleeding stops or slows down. General instructions Do not use tampons, douche, or have sex until your doctor says these things are okay. Change your pads often. Get regular exams. These include: Pelvic exams. Screenings for cancer of the cervix. It is up to you to get the results of any tests that are done. Ask how to get your results when they are  ready. Watch for any changes in your bleeding. For 2 months, write down: When your monthly period starts. When your monthly period ends. When you get any abnormal bleeding from your vagina. What problems you notice. Keep all follow-up visits. Contact a doctor if: The bleeding lasts more than one week. You feel dizzy at times. You feel like you may vomit (nausea). You vomit. You feel light-headed or weak. Your symptoms get worse. Get help right away if: You faint. You have to change pads every hour. You have pain in your belly. You have a fever or chills. You get sweaty or weak. You pass large blood clots from your vagina. These symptoms may be an emergency. Get help right away. Call your local emergency services (911 in the [place] Do not wait to see if the symptoms will go away. Do not drive yourself to the hospital. Summary Abnormal uterine bleeding means bleeding more than normal from your womb (uterus). Any kind of bleeding that is not normal should be checked by a doctor. Treatment depends on the cause of your bleeding and how much you bleed. Get help right away if you faint, you have to change pads every hour, or you pass large blood clots from your vagina. This information is not intended to replace advice given to you by your health care provider. Make sure you discuss any questions you have with your health care provider. Document Revised: 04/02/2021 Document Reviewed: 04/02/2021 Elsevier Patient Education  2023 Elsevier Inc.  

## 2022-08-13 NOTE — Progress Notes (Signed)
Pt is in office for AEX.  Pt is having some premenopausal symptoms- irregular cycles and night sweats. Pt states cycles are heavy.  Pt has hx of BTL. Pt states she feels her vaginal pH may be "off", ?related to cycles.

## 2022-08-14 ENCOUNTER — Other Ambulatory Visit: Payer: Self-pay

## 2022-08-14 DIAGNOSIS — B9689 Other specified bacterial agents as the cause of diseases classified elsewhere: Secondary | ICD-10-CM

## 2022-08-14 LAB — CERVICOVAGINAL ANCILLARY ONLY
Bacterial Vaginitis (gardnerella): POSITIVE — AB
Candida Glabrata: NEGATIVE
Candida Vaginitis: NEGATIVE
Chlamydia: NEGATIVE
Comment: NEGATIVE
Comment: NEGATIVE
Comment: NEGATIVE
Comment: NEGATIVE
Comment: NEGATIVE
Comment: NORMAL
Neisseria Gonorrhea: NEGATIVE
Trichomonas: NEGATIVE

## 2022-08-14 MED ORDER — METRONIDAZOLE 500 MG PO TABS: 500.0000 mg | ORAL_TABLET | Freq: Two times a day (BID) | ORAL | 0 refills | Status: DC

## 2022-08-14 NOTE — Progress Notes (Signed)
Courtney [name] is a 41 y.o. G28P2002 female here for a routine annual gynecologic exam.  Current complaints: DUB.   Denies  pelvic pain, problems with intercourse or other gynecologic concerns.    Gynecologic History Patient's last menstrual period was 07/28/2022. Contraception: tubal ligation Last Pap: 6/22. Results were: abnormal, HPV +, cytology negative Last mammogram: 8/23. Results were: normal  Obstetric History OB History  Gravida Para Term Preterm AB Living  '2 2 2     2  '$ SAB IAB Ectopic Multiple Live Births        0 2    # Outcome Date GA Lbr Len/2nd Weight Sex Delivery Anes PTL Lv  2 Term 02/26/16 [redacted]w[redacted]d/ 00:44 7 lb 13.9 oz (3.57 kg) M CS-Vac Gen  LIV  1 Term 08/17/13 491w2d1:31 / 04:17 8 lb 4.6 oz (3.759 kg) M CS-LTranv Gen  LIV    Past Medical History:  Diagnosis Date   Allergy    Hypertension    Supervision of high risk pregnancy in third trimester 02/04/2016   UTI (lower urinary tract infection)    [place]   [place]    Past Surgical History:  Procedure Laterality Date   CESAREAN SECTION N/A 08/17/2013   Procedure: Primary Cesarean Section Delivery Baby Boy @ [id number] 9/10 ;  Surgeon: LiLahoma CrockerMD;  [facility];  Service: Obstetrics;  Laterality: N/A;   CESAREAN SECTION N/A 02/26/2016   Procedure: CESAREAN SECTION WITH BILATERAL TUBAL LIGATION ;  Surgeon: ChShelly BombardMD;  [facility];  Service: Obstetrics;  Laterality: N/A;   GYNECOLOGIC CRYOSURGERY     hemmeroid     TUBAL LIGATION  2017    Current Outpatient Medications on File Prior to Visit  Medication Sig Dispense Refill   ferrous sulfate 325 (65 FE) MG tablet Take 325 mg by mouth daily with breakfast.     triamterene-hydrochlorothiazide (DYAZIDE) 37.5-25 MG capsule TAKE 1 CAPSULE BY MOUTH EVERY DAY IN THE MORNING 90 capsule 3   Multiple Vitamins-Minerals (WOMENS MULTIVITAMIN PO) Take by mouth.     vitamin B-12  (CYANOCOBALAMIN) 500 MCG tablet Take 500 mcg by mouth daily.     No current facility-administered medications on file prior to visit.    No Known Allergies  Social History   Socioeconomic History   Marital status: Married    Spouse name: Papijonia   Number of children: 2   Years of education: 16   Highest education level: Not on file  Occupational History   Occupation: Customer service rep  Tobacco Use   Smoking status: Never   Smokeless tobacco: Never  Vaping Use   Vaping Use: Never used  Substance and Sexual Activity   Alcohol use: Yes    Alcohol/week: 0.0 standard drinks of alcohol    Comment: occ.    Drug use: No   Sexual activity: Yes    Partners: Male    Birth control/protection: Surgical  Other Topics Concern   Not on file  Social History Narrative   Fun: play with her child   Denies religious beliefs that would effect health care.    Social Determinants of Health   Financial Resource Strain: Not on file  Food Insecurity: Not on file  Transportation Needs: Not on file  Physical Activity: Not on file  Stress: Not on file  Social Connections: Not on file  Intimate Partner Violence: Not on file    Family History  Problem Relation Age of Onset   Multiple sclerosis Mother    Diabetes Maternal Grandmother    Hypertension Maternal Grandmother    Cancer Maternal Grandfather    Emphysema Maternal Grandfather    Diabetes Paternal Grandmother    Hypertension Paternal Grandmother    Cancer Paternal Grandfather    Colon cancer Maternal Uncle    Liver cancer Neg Hx    Pancreatic cancer Neg Hx    Rectal cancer Neg Hx    Stomach cancer Neg Hx     The following portions of the patient's history were reviewed and updated as appropriate: allergies, current medications, past family history, past medical history, past social history, past surgical history and problem list.  Review of Systems Pertinent items noted in HPI and remainder of comprehensive ROS otherwise  negative.   Objective:  BP 123/82   Pulse 90   Ht '5\' 3"'$  (1.6 m)   Wt 183 lb (83 kg)   LMP 07/28/2022   BMI 32.42 kg/m  CONSTITUTIONAL: Well-developed, well-nourished female in no acute distress.  HENT:  Normocephalic, atraumatic, External right and left ear normal. Oropharynx is clear and moist EYES: Conjunctivae and EOM are normal. Pupils are equal, round, and reactive to light. No scleral icterus.  NECK: Normal range of motion, supple, no masses.  Normal thyroid.  SKIN: Skin is warm and dry. No rash noted. Not diaphoretic. No erythema. No pallor. [place]: Alert and oriented to person, place, and time. Normal reflexes, muscle tone coordination. No cranial nerve deficit noted. PSYCHIATRIC: Normal mood and affect. Normal behavior. Normal judgment and thought content. CARDIOVASCULAR: Normal heart rate noted, regular rhythm RESPIRATORY: Clear to auscultation bilaterally. Effort and breath sounds normal, no problems with respiration noted. BREASTS: Symmetric in size. No masses, skin changes, nipple drainage, or lymphadenopathy. ABDOMEN: Soft, normal bowel sounds, no distention noted.  No tenderness, rebound or guarding.  PELVIC: Normal appearing external genitalia; normal appearing vaginal mucosa and cervix.  No abnormal discharge noted.  Pap smear obtained.  Normal uterine size, no other palpable masses, no uterine or adnexal tenderness. MUSCULOSKELETAL: Normal range of motion. No tenderness.  No cyanosis, clubbing, or edema.  2+ distal pulses.   Assessment:  Annual gynecologic examination with pap smear DUB Plan:  Will follow up results of pap smear and manage accordingly. Will check GYN U/S Routine preventative health maintenance measures emphasized. Please refer to After Visit Summary for other counseling recommendations.    Chancy Milroy, MD, [place] Attending [place] for [facility], [place]

## 2022-08-20 ENCOUNTER — Ambulatory Visit (HOSPITAL_COMMUNITY)
Admission: RE | Admit: 2022-08-20 | Discharge: 2022-08-20 | Disposition: A | Discharge status: Home / Self Care | Payer: 59 | Source: Ambulatory Visit | Attending: Obstetrics and Gynecology | Admitting: Obstetrics and Gynecology

## 2022-08-20 DIAGNOSIS — N938 Other specified abnormal uterine and vaginal bleeding: Secondary | ICD-10-CM | POA: Diagnosis not present

## 2022-08-20 LAB — CYTOLOGY - PAP
Comment: NEGATIVE
Diagnosis: NEGATIVE
High risk HPV: NEGATIVE

## 2022-09-17 ENCOUNTER — Ambulatory Visit: Payer: Self-pay | Admitting: Obstetrics and Gynecology

## 2022-10-01 ENCOUNTER — Ambulatory Visit: Payer: 59 | Admitting: Obstetrics and Gynecology

## 2022-10-01 ENCOUNTER — Encounter: Payer: Self-pay | Admitting: Obstetrics and Gynecology

## 2022-10-01 VITALS — BP 142/81 | HR 93 | Wt 184.0 lb

## 2022-10-01 DIAGNOSIS — N938 Other specified abnormal uterine and vaginal bleeding: Secondary | ICD-10-CM

## 2022-10-01 DIAGNOSIS — D259 Leiomyoma of uterus, unspecified: Secondary | ICD-10-CM

## 2022-10-01 DIAGNOSIS — N83209 Unspecified ovarian cyst, unspecified side: Secondary | ICD-10-CM

## 2022-10-01 NOTE — Progress Notes (Signed)
Pt is in office for follow up/ u/s results and plan. Pt states symptoms are about the same as last visit.

## 2022-10-01 NOTE — Patient Instructions (Signed)
Intrauterine Device Information An intrauterine device (IUD) is a medical device that is inserted into the uterus to prevent pregnancy. It is a small, T-shaped device that has one or two nylon strings hanging down from it. The strings hang out of the lower part of the uterus (cervix) to allow for future IUD removal. There are two types of IUDs: Hormone IUD. This type of IUD is made of plastic and contains the hormone progestin (synthetic progesterone). A hormone IUD may last 3-5 years. Copper IUD. This type of IUD has copper wire wrapped around it. A copper IUD may last up to 10 years. How is an IUD inserted? An IUD is inserted through the vagina, through the cervix, and into the uterus with a minor medical procedure. The procedure for IUD insertion may vary among health care providers and hospitals. How does an IUD work? Synthetic progesterone in a hormonal IUD prevents pregnancy by: Thickening cervical mucus to prevent sperm from entering the uterus. Thinning the uterine lining to prevent a fertilized egg from being implanted there. Copper in a copper IUD prevents pregnancy by making the uterus and fallopian tubes produce a fluid that kills sperm. What are the advantages of an IUD? Advantages of either type of IUD An IUD: Is highly effective in preventing pregnancy. Is reversible. You can become pregnant shortly after the IUD is removed. Is low-maintenance and can stay in place for a long time. Has no estrogen-related side effects. Can be used when breastfeeding. Is not associated with weight gain. Can be inserted right after childbirth, an abortion, or a miscarriage. Advantages of a hormone IUD If it is inserted within 7 days of your period starting, it works right after it has been inserted. If the hormone IUD is inserted at any other time in your cycle, you will need to use a backup method of birth control for 7 days after insertion. It can make menstrual periods lighter or stop  completely. It can reduce menstrual cramping and other discomforts from menstrual periods. It can be used for 3-5 years, depending on which IUD you have. Advantages of a copper IUD It works right after it is inserted. It can be used as a form of emergency birth control if it is inserted within 5 days after having unprotected sex. It does not interfere with your body's natural hormones. It can be used for up to 10 years. What are the disadvantages of an IUD? An IUD may cause irregular menstrual bleeding for a period of time after insertion. It is common to have pain during insertion and have cramping and vaginal bleeding after insertion. An IUD may cut the uterus (uterine perforation) when it is inserted. This is rare. Pelvic inflammatory disease (PID) may happen after insertion of an IUD. PID is an infection in the uterus and fallopian tubes. The IUD does not cause the infection. The infection is usually from an unknown sexually transmitted infection (STI). This is rare, and it usually happens during the first 20 days after the IUD is inserted. A copper IUD can make your menstrual flow heavier and more painful. IUDs cannot prevent sexually transmitted infections (STIs). How is an IUD removed?  You will lie on your back with your knees bent and your feet in footrests (stirrups). A device will be inserted into your vagina to spread apart the vaginal walls (speculum). This will allow your health care provider to see the strings attached to the IUD. Your health care provider will use a small instrument (forceps) to   grasp the IUD strings and will pull firmly until the IUD is removed. You may have some discomfort when the IUD is removed. Your health care provider may recommend taking over-the-counter pain relievers, such as ibuprofen, before the procedure. You may also have minor spotting for a few days after the procedure. The procedure for IUD removal may vary among health care providers and  hospitals. Is an IUD right for me? If you are interested in an IUD, discuss it with your health care provider. He or she will make sure you are a good candidate for an IUD and will let you know more about the advantages, disadvantage, and possible side effects. This will allow you to make a decision about the device. Summary An intrauterine device (IUD) is a medical device that is inserted in the uterus to prevent pregnancy. It is a small, T-shaped device that has one or two nylon strings hanging down from it. A hormone IUD contains the hormone progestin (synthetic progesterone). A copper IUD has copper wire wrapped around it. Synthetic progesterone in a hormone IUD prevents pregnancy by thickening cervical mucus and thinning the walls of the uterus. Copper in a copper IUD prevents pregnancy by making the uterus and fallopian tubes produce a fluid that kills sperm. A hormone IUD can be left in place for 3-5 years. A copper IUD can be left in place for up to 10 years. An IUD is inserted and removed by a health care provider. You may feel some pain during insertion and removal. Your health care provider may recommend taking over-the-counter pain medicine, such as ibuprofen, before an IUD procedure. This information is not intended to replace advice given to you by your health care provider. Make sure you discuss any questions you have with your health care provider. Document Revised: 06/13/2020 Document Reviewed: 06/13/2020 Elsevier Patient Education  [place]. Uterine Fibroids  Uterine fibroids, also called leiomyomas, are noncancerous (benign) tumors that can grow in the uterus. They can cause heavy menstrual bleeding and pain. Fibroids may also grow in the fallopian tubes, cervix, or tissues (ligaments) near the uterus. You may have one or many fibroids. Fibroids vary in size, weight, and where they grow in the uterus. Some can become quite large. Most fibroids do not require medical  treatment. What are the causes? The cause of this condition is not known. What increases the risk? You are more likely to develop this condition if you: Are in your 30s or 40s and have not gone through menopause. Have a family history of this condition. Are of African American descent. Started your menstrual period at age 5 or younger. Have never given birth. Are overweight or obese. What are the signs or symptoms? Many women do not have any symptoms. Symptoms of this condition may include: Heavy menstrual bleeding. Bleeding between menstrual periods. Pain and pressure in the pelvic area, between your hip bones. Pain during sex. Bladder problems, such as needing to urinate right away or more often than usual. Inability to have children (infertility). Failure to carry pregnancy to term (miscarriage). How is this diagnosed? This condition may be diagnosed based on: Your symptoms and medical history. A physical exam. A pelvic exam that includes feeling for any tumors. Imaging tests, such as ultrasound or MRI. How is this treated? Treatment for this condition may include follow-up visits with your health care provider to monitor your fibroids for any changes. Other treatment may include: Medicines, such as: Medicines to relieve pain, including aspirin and NSAIDs,  such as ibuprofen or naproxen. Hormone therapy. Treatment may be given as a pill or an injection, or it may be inserted into the uterus using an intrauterine device (IUD). Surgery that would do one of the following: Remove the fibroids (myomectomy). This may be recommended if fibroids affect your fertility and you want to become pregnant. Remove the uterus (hysterectomy). Block the blood supply to the fibroids (uterine artery embolization). This can cause them to shrink and die. Follow these instructions at home: Medicines Take over-the-counter and prescription medicines only as told by your health care provider. Ask your  health care provider if you should take iron pills or eat more iron-rich foods, such as dark green, leafy vegetables. Heavy menstrual bleeding can cause low iron levels. Managing pain If directed, apply heat to your back or abdomen to reduce pain. Use the heat source that your health care provider recommends, such as a moist heat pack or a heating pad. To apply heat: Place a towel between your skin and the heat source. Leave the heat on for 20-30 minutes. Remove the heat if your skin turns bright red. This is especially important if you are unable to feel pain, heat, or cold. You may have a greater risk of getting burned.  General instructions Pay close attention to your menstrual cycle. Tell your health care provider about any changes, such as: Heavier bleeding that requires you to change your pads or tampons more than usual. A change in the number of days that your menstrual period lasts. A change in symptoms that come with your menstrual period, such as back pain or cramps in your abdomen. Keep all follow-up visits. This is important, especially if your fibroids need to be monitored for any changes. Contact a health care provider if you: Have pelvic pain, back pain, or cramps in your abdomen that do not get better with medicine or heat. Develop new bleeding between menstrual periods. Have increased bleeding during or between menstrual periods. Feel more tired or weak than usual. Feel light-headed. Get help right away if you: Faint. Have pelvic pain that suddenly gets worse. Have severe vaginal bleeding that soaks a tampon or pad in 30 minutes or less. Summary Uterine fibroids are noncancerous (benign) tumors that can develop in the uterus. The exact cause of this condition is not known. Most fibroids do not require medical treatment unless they affect your ability to have children (fertility). Contact a health care provider if you have pelvic pain, back pain, or cramps in your abdomen  that do not get better with medicines. Get help right away if you faint, have pelvic pain that suddenly gets worse, or have severe vaginal bleeding. This information is not intended to replace advice given to you by your health care provider. Make sure you discuss any questions you have with your health care provider. Document Revised: 07/03/2020 Document Reviewed: 07/03/2020 Elsevier Patient Education  [place].

## 2022-10-01 NOTE — Progress Notes (Signed)
Ms Kalina presents for Gyn U/S results. GYN U/S results reviewed with pt. Uterine fibroid and ovarian cyst noted  PE AF VSS Lungs clear Heart RRR Abd soft + BS  A/B DUB        Uterine fibroids  Tx options reviewed with pt. Conservative, hormonal, non hormonal and surgerical management reviewed. Pt undecided at this time. She will call back with her decision.

## 2022-12-10 ENCOUNTER — Ambulatory Visit (HOSPITAL_BASED_OUTPATIENT_CLINIC_OR_DEPARTMENT_OTHER)
Admission: RE | Admit: 2022-12-10 | Discharge: 2022-12-10 | Disposition: A | Discharge status: Home / Self Care | Payer: 59 | Source: Ambulatory Visit | Attending: Obstetrics and Gynecology | Admitting: Obstetrics and Gynecology

## 2022-12-10 DIAGNOSIS — N83209 Unspecified ovarian cyst, unspecified side: Secondary | ICD-10-CM | POA: Diagnosis present

## 2023-02-14 ENCOUNTER — Other Ambulatory Visit: Payer: Self-pay | Admitting: Obstetrics and Gynecology

## 2023-02-14 DIAGNOSIS — B9689 Other specified bacterial agents as the cause of diseases classified elsewhere: Secondary | ICD-10-CM

## 2023-08-03 ENCOUNTER — Other Ambulatory Visit: Payer: Self-pay | Admitting: Obstetrics and Gynecology

## 2023-08-03 DIAGNOSIS — Z1231 Encounter for screening mammogram for malignant neoplasm of breast: Secondary | ICD-10-CM

## 2023-08-31 ENCOUNTER — Ambulatory Visit (INDEPENDENT_AMBULATORY_CARE_PROVIDER_SITE_OTHER): Payer: 59 | Admitting: Obstetrics and Gynecology

## 2023-08-31 ENCOUNTER — Encounter: Payer: Self-pay | Admitting: Obstetrics and Gynecology

## 2023-08-31 ENCOUNTER — Ambulatory Visit
Admission: RE | Admit: 2023-08-31 | Discharge: 2023-08-31 | Disposition: A | Discharge status: Home / Self Care | Payer: 59 | Source: Ambulatory Visit | Attending: Obstetrics and Gynecology | Admitting: Obstetrics and Gynecology

## 2023-08-31 VITALS — BP 143/81 | HR 87 | Ht 63.0 in | Wt 180.2 lb

## 2023-08-31 DIAGNOSIS — N920 Excessive and frequent menstruation with regular cycle: Secondary | ICD-10-CM

## 2023-08-31 DIAGNOSIS — D689 Coagulation defect, unspecified: Secondary | ICD-10-CM

## 2023-08-31 DIAGNOSIS — D6801 Von willebrand disease, type 1: Secondary | ICD-10-CM

## 2023-08-31 DIAGNOSIS — Z01419 Encounter for gynecological examination (general) (routine) without abnormal findings: Secondary | ICD-10-CM | POA: Diagnosis not present

## 2023-08-31 DIAGNOSIS — Z1231 Encounter for screening mammogram for malignant neoplasm of breast: Secondary | ICD-10-CM

## 2023-08-31 MED ORDER — TRANEXAMIC ACID 650 MG PO TABS: 1300.0000 mg | ORAL_TABLET | Freq: Three times a day (TID) | ORAL | 2 refills | Status: DC

## 2023-08-31 NOTE — Progress Notes (Unsigned)
Patient presents for AEX.  Last Pap: 08/13/2022- normal  Last Mammogram: 07/18/2022  Contraception: Tubal Ligation  Vaginal/Urinary Symptoms: Declines  STD Screen: Declines  Other Concerns: Migraines with n/v with menorrhagia.

## 2023-09-01 NOTE — Progress Notes (Signed)
GYNECOLOGY ANNUAL PREVENTATIVE CARE ENCOUNTER NOTE  History:     Courtney [name] is a 42 y.o. G11P2002 female here for a routine annual gynecologic exam.  Current complaints: menstrual migraine and heavy menstrual bleeding.   Pt has migraines toward the end of her menstrual cycle.  Menses are moderately heavy lasting 5-7 days.  Pt has been previously diagnosed with von willebrand's disease which will contribute to her menstrual bleeding. Gynecologic History No LMP recorded. Contraception: tubal ligation Last Pap: 08/13/22. Results were: normal with negative HPV Last mammogram: 07/18/22. Results were: normal, pt has mammogram this afternoon.  Obstetric History OB History  Gravida Para Term Preterm AB Living  2 2 2     2   SAB IAB Ectopic Multiple Live Births        0 2    # Outcome Date GA Lbr Len/2nd Weight Sex Type Anes PTL Lv  2 Term 02/26/16 [redacted]w[redacted]d / 00:44 7 lb 13.9 oz (3.57 kg) M CS-Vac Gen  LIV  1 Term 08/17/13 [redacted]w[redacted]d 21:31 / 04:17 8 lb 4.6 oz (3.759 kg) M CS-LTranv Gen  LIV    Past Medical History:  Diagnosis Date   Allergy    Hypertension    Supervision of high risk pregnancy in third trimester 02/04/2016   UTI (lower urinary tract infection)    Von Willebrand disease (HCC)    Von Willebrand disease type IA (HCC) 02/04/2016    Past Surgical History:  Procedure Laterality Date   CESAREAN SECTION N/A 08/17/2013   Procedure: Primary Cesarean Section Delivery Baby Boy @ 2348, Apgars 9/10 ;  Surgeon: Antionette Char, MD;  Location: [facility];  Service: Obstetrics;  Laterality: N/A;   CESAREAN SECTION N/A 02/26/2016   Procedure: CESAREAN SECTION WITH BILATERAL TUBAL LIGATION ;  Surgeon: Brock Bad, MD;  Location: [facility];  Service: Obstetrics;  Laterality: N/A;   GYNECOLOGIC CRYOSURGERY     hemmeroid     TUBAL LIGATION  2017    Current Outpatient Medications on File Prior to Visit  Medication Sig Dispense Refill   Cholecalciferol (VITAMIN D-3) 25 MCG (1000 UT) CAPS  Take by mouth.     ferrous sulfate 325 (65 FE) MG tablet Take 325 mg by mouth daily with breakfast.     metroNIDAZOLE (FLAGYL) 500 MG tablet TAKE 1 TABLET BY MOUTH TWICE A DAY 14 tablet 0   Multiple Vitamins-Minerals (WOMENS MULTIVITAMIN PO) Take by mouth.     triamterene-hydrochlorothiazide (DYAZIDE) 37.5-25 MG capsule TAKE 1 CAPSULE BY MOUTH EVERY DAY IN THE MORNING 90 capsule 3   vitamin B-12 (CYANOCOBALAMIN) 500 MCG tablet Take 500 mcg by mouth daily. (Patient not taking: Reported on 08/31/2023)     No current facility-administered medications on file prior to visit.    No Known Allergies  Social History:  reports that she has never smoked. She has never used smokeless tobacco. She reports current alcohol use. She reports that she does not use drugs.  Family History  Problem Relation Age of Onset   Multiple sclerosis Mother    Diabetes Maternal Grandmother    Hypertension Maternal Grandmother    Cancer Maternal Grandfather    Emphysema Maternal Grandfather    Diabetes Paternal Grandmother    Hypertension Paternal Grandmother    Cancer Paternal Grandfather    Colon cancer Maternal Uncle    Liver cancer Neg Hx    Pancreatic cancer Neg Hx    Rectal cancer Neg Hx    Stomach cancer Neg Hx  The following portions of the patient's history were reviewed and updated as appropriate: allergies, current medications, past family history, past medical history, past social history, past surgical history and problem list.  Review of Systems Pertinent items noted in HPI and remainder of comprehensive ROS otherwise negative.  Physical Exam:  BP (!) 143/81   Pulse 87   Ht 5\' 3"  (1.6 m)   Wt 180 lb 3.2 oz (81.7 kg)   BMI 31.92 kg/m  CONSTITUTIONAL: Well-developed, well-nourished female in no acute distress.  HENT:  Normocephalic, atraumatic, External right and left ear normal. Oropharynx is clear and moist EYES: Conjunctivae and EOM are normal.  NECK: Normal range of motion, supple,  no masses.  Normal thyroid.  SKIN: Skin is warm and dry. No rash noted. Not diaphoretic. No erythema. No pallor. MUSCULOSKELETAL: Normal range of motion. No tenderness.  No cyanosis, clubbing, or edema.  2+ distal pulses. NEUROLOGIC: Alert and oriented to person, place, and time. Normal reflexes, muscle tone coordination.  PSYCHIATRIC: Normal mood and affect. Normal behavior. Normal judgment and thought content. CARDIOVASCULAR: Normal heart rate noted, regular rhythm RESPIRATORY: Clear to auscultation bilaterally. Effort and breath sounds normal, no problems with respiration noted. BREASTS: Symmetric in size. No masses, tenderness, skin changes, nipple drainage, or lymphadenopathy bilaterally. Performed in the presence of a chaperone. ABDOMEN: Soft, no distention noted.  No tenderness, rebound or guarding.  PELVIC: Normal appearing external genitalia and urethral meatus; normal appearing vaginal mucosa and cervix.  No abnormal discharge noted.   Normal uterine size, no other palpable masses, no uterine or adnexal tenderness.  Performed in the presence of a chaperone.   Assessment and Plan:    1. Women's annual routine gynecological examination Normal annual exam, mammogram this afternoon.  Pt has menstrual migraines as well.  Explained to patient that a hysterectomy will not help treat menstrual migraines as it is more hormonally regulated.  Pt advised continuous OCPs would be the only treatment modality that would treat both heavy bleeding and the menstrual migraines, ie to counteract the drop in estrogen.  Prolonged use of OCPs without withdrawal bleeding could contribute to breakthrough bleeding.  2. Von Willebrand disease type IA (HCC)  - tranexamic acid (LYSTEDA) 650 MG TABS tablet; Take 2 tablets (1,300 mg total) by mouth 3 (three) times daily. Take during menses for a maximum of 7 days,  Dispense: 30 tablet; Refill: 2  3. Encounter for gynecological examination without abnormal  finding   4. 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 Discussed treatment options  Lysteda Progesterone IUD Uterine ablation Hysterectomy Uptodate review performed to validate treatment options  Pt desires to try lysteda during the duration of her menses.  Will; reevaluate in 2 months  - tranexamic acid (LYSTEDA) 650 MG TABS tablet; Take 2 tablets (1,300 mg total) by mouth 3 (three) times daily. Take during menses for a maximum of 7 days,  Dispense: 30 tablet; Refill: 2   Mammogram scheduled Routine preventative health maintenance measures emphasized. Please refer to After Visit Summary for other counseling recommendations.      Mariel Aloe, MD, FACOG Obstetrician & Gynecologist, [facility], [facility]

## 2023-11-04 ENCOUNTER — Ambulatory Visit: Payer: 59 | Admitting: Obstetrics and Gynecology

## 2023-11-04 VITALS — BP 125/82 | HR 90 | Wt 183.0 lb

## 2023-11-04 DIAGNOSIS — N920 Excessive and frequent menstruation with regular cycle: Secondary | ICD-10-CM

## 2023-11-04 DIAGNOSIS — N938 Other specified abnormal uterine and vaginal bleeding: Secondary | ICD-10-CM

## 2023-11-04 DIAGNOSIS — D689 Coagulation defect, unspecified: Secondary | ICD-10-CM

## 2023-11-04 NOTE — Progress Notes (Signed)
Pt has been using the Lysteda, states has helped the bleeding but has all other side effects.

## 2023-11-04 NOTE — Patient Instructions (Addendum)
[email or web address]

## 2023-11-04 NOTE — Progress Notes (Signed)
  CC: medication follow up Subjective:    Patient ID: Courtney [name], female    DOB: [birth year], 42 y.o.   MRN: [id number]  HPI 42 yo G2P2 seen for follow up for medical management of menorrhagia.  Pt has tried lysteda.  Medication decreased menses from 4-5 days of being heavy to around 3 days of light to moderate bleeding.  Courtney [name] has been effective but she says it gives her headaches which she would like to avoid.  Discussed uterine ablation or progesterone IUD.  Pt desires ablation and tubal ligation has been previously performed.   Review of Systems     Objective:   Physical Exam Vitals:   11/04/23 1343  BP: 125/82  Pulse: 90         Assessment & Plan:   1. DUB (dysfunctional uterine bleeding)   2. 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  I spent 20 minutes dedicated to the care of this patient including previsit review of records, face to face time with the patient discussing treatment options and post visit testing.     Warden Fillers, MD Faculty Attending, Center for [facility]

## 2023-12-22 ENCOUNTER — Telehealth: Payer: Self-pay

## 2023-12-22 NOTE — Telephone Encounter (Signed)
 Called patient to schedule surgery w/ Dr. Donavan Foil. Left voicemail stating Dr. Donavan Foil first available is 02/08/23 and requested patient call me back to schedule at [phone].

## 2023-12-24 ENCOUNTER — Telehealth: Payer: Self-pay

## 2023-12-24 NOTE — Telephone Encounter (Signed)
 Patient called back to let me know she is not available for surgery on 02/09/24 due to childcare needs. Advised that in February, Dr bass was only scheduled in the OR on 02/09/24. Patient requested to be scheduled once the March schedule is available and prefers a Wednesday if possible.

## 2024-02-15 ENCOUNTER — Other Ambulatory Visit: Payer: Self-pay | Admitting: Obstetrics and Gynecology

## 2024-02-15 DIAGNOSIS — D689 Coagulation defect, unspecified: Secondary | ICD-10-CM

## 2024-02-15 DIAGNOSIS — D6801 Von willebrand disease, type 1: Secondary | ICD-10-CM

## 2024-02-15 MED ORDER — TRANEXAMIC ACID 650 MG PO TABS: 1300.0000 mg | ORAL_TABLET | Freq: Three times a day (TID) | ORAL | 4 refills | Status: AC

## 2024-02-15 NOTE — Progress Notes (Signed)
 Refill of TXA sent

## 2024-02-23 ENCOUNTER — Telehealth: Payer: Self-pay

## 2024-02-23 NOTE — Telephone Encounter (Signed)
 Called patient to notify her that Dr. Donavan Foil would not been in the OR until June or July. Patient states she prefers to wait for Dr.Bass to complete her surgery and hopes to have the procedure scheduled in July. Advised I would follow up with her as soon as the July schedule is available. Patient would like a call from Dr. Donavan Foil relating to the biopsy he requested prior to surgery.

## 2024-04-12 ENCOUNTER — Telehealth: Payer: Self-pay

## 2024-04-12 NOTE — Telephone Encounter (Signed)
 Reached out to patient to schedule surgery w/ Dr. Racheal Buddle on 04/28/24. Patient states the office has not reached out to her in regards to scheduling the endometrial biopsy that Dr. Racheal Buddle requested prior to surgery. Advised I will speak to Dr. Racheal Buddle and have the office contact her.

## 2024-04-13 ENCOUNTER — Telehealth: Payer: Self-pay

## 2024-04-13 NOTE — Telephone Encounter (Signed)
 Returned call, answered pt questions pertaining to endometrial biopsy, transferred to scheduler.

## 2024-04-29 ENCOUNTER — Telehealth: Payer: Self-pay

## 2024-04-29 NOTE — Telephone Encounter (Signed)
 Returned call, no answer, left vm

## 2024-04-29 NOTE — Telephone Encounter (Signed)
 I called patient to schedule surgery w/ Dr. Racheal Buddle on 06/21/24 at 0730 at [facility] at 2 pm. Patient states she's available and can arrive by noon. Pre-Op Instructions and surgery details were provided by phone. Written details will be sent to patient's Mychart acct.

## 2024-05-11 ENCOUNTER — Encounter: Payer: Self-pay | Admitting: Obstetrics and Gynecology

## 2024-05-13 ENCOUNTER — Other Ambulatory Visit: Admitting: Obstetrics and Gynecology

## 2024-05-26 ENCOUNTER — Encounter: Payer: Self-pay | Admitting: Obstetrics and Gynecology

## 2024-05-26 ENCOUNTER — Ambulatory Visit: Admitting: Obstetrics and Gynecology

## 2024-05-26 ENCOUNTER — Other Ambulatory Visit (HOSPITAL_COMMUNITY)
Admission: RE | Admit: 2024-05-26 | Discharge: 2024-05-26 | Disposition: A | Discharge status: Home / Self Care | Source: Ambulatory Visit | Attending: Obstetrics and Gynecology | Admitting: Obstetrics and Gynecology

## 2024-05-26 VITALS — BP 127/83 | HR 89 | Ht 63.0 in | Wt 186.0 lb

## 2024-05-26 DIAGNOSIS — N938 Other specified abnormal uterine and vaginal bleeding: Secondary | ICD-10-CM | POA: Diagnosis present

## 2024-05-26 DIAGNOSIS — D689 Coagulation defect, unspecified: Secondary | ICD-10-CM | POA: Insufficient documentation

## 2024-05-26 DIAGNOSIS — N920 Excessive and frequent menstruation with regular cycle: Secondary | ICD-10-CM | POA: Diagnosis not present

## 2024-05-26 DIAGNOSIS — Z01818 Encounter for other preprocedural examination: Secondary | ICD-10-CM

## 2024-05-26 DIAGNOSIS — Z3202 Encounter for pregnancy test, result negative: Secondary | ICD-10-CM | POA: Diagnosis not present

## 2024-05-26 LAB — POCT URINE PREGNANCY: Preg Test, Ur: NEGATIVE

## 2024-05-26 MED ORDER — MISOPROSTOL 100 MCG PO TABS: ORAL_TABLET | ORAL | 0 refills | Status: DC

## 2024-05-26 NOTE — Progress Notes (Signed)
 ENDOMETRIAL BIOPSY      Daytona Hedman is a 43 y.o. [id number] here for endometrial biopsy.  The indications for endometrial biopsy were reviewed.  Risks of the biopsy including cramping, bleeding, infection, uterine perforation, inadequate specimen and need for additional procedures were discussed. The patient states she understands and agrees to undergo procedure today. Consent was signed. Time out was performed.   Indications: DUB Urine HCG: neg  A bivalve speculum was placed into the vagina and the cervix was easily visualized and was prepped with Betadine x2. A single-toothed tenaculum was placed on the anterior lip of the cervix to stabilize it. The 3 mm pipelle was introduced into the endometrial cavity without difficulty to a depth of 9 cm, and a moderate amount of tissue was obtained and sent to pathology. This was repeated for a total of 2 passes. The instruments were removed from the patient's vagina. Minimal bleeding from the cervix at the tenaculum was noted.   The patient tolerated the procedure well. Routine post-procedure instructions were given to the patient.    Will base further management on results of biopsy.  Avie Boeck, MD

## 2024-05-26 NOTE — Progress Notes (Signed)
 OB/GYN Pre-Op History and Physical  Courtney [name] is a 43 y.o. [id number] presenting for preoperative evaluation for hysteroscopy D and C with novasure ablation.       Past Medical History:  Diagnosis Date   Allergy    Hypertension    Supervision of high risk pregnancy in third trimester 02/04/2016   UTI (lower urinary tract infection)    Von Willebrand disease (HCC)    Von Willebrand disease type IA (HCC) 02/04/2016    Past Surgical History:  Procedure Laterality Date   CESAREAN SECTION N/A 08/17/2013   Procedure: Primary Cesarean Section Delivery Baby Boy @ 2348, Apgars 9/10 ;  Surgeon: Abdul Hodgkin, MD;  Location: [facility];  Service: Obstetrics;  Laterality: N/A;   CESAREAN SECTION N/A 02/26/2016   Procedure: CESAREAN SECTION WITH BILATERAL TUBAL LIGATION ;  Surgeon: Gabrielle Joiner, MD;  Location: [facility];  Service: Obstetrics;  Laterality: N/A;   GYNECOLOGIC CRYOSURGERY     hemmeroid     TUBAL LIGATION  2017    OB History  Gravida Para Term Preterm AB Living  2 2 2   2   SAB IAB Ectopic Multiple Live Births     0 2    # Outcome Date GA Lbr Len/2nd Weight Sex Type Anes PTL Lv  2 Term 02/26/16 [redacted]w[redacted]d / 00:44 7 lb 13.9 oz (3.57 kg) M CS-Vac Gen  LIV  1 Term 08/17/13 [redacted]w[redacted]d 21:31 / 04:17 8 lb 4.6 oz (3.759 kg) M CS-LTranv Gen  LIV    Social History   Socioeconomic History   Marital status: Married    Spouse name: Papijonia   Number of children: 2   Years of education: 16   Highest education level: Not on file  Occupational History   Occupation: Customer service rep  Tobacco Use   Smoking status: Never   Smokeless tobacco: Never  Vaping Use   Vaping status: Never Used  Substance and Sexual Activity   Alcohol use: Yes    Alcohol/week: 0.0 standard drinks of alcohol    Comment: occ.    Drug use: No   Sexual activity: Yes    Partners: Male    Birth control/protection: Surgical  Other Topics Concern   Not on file  Social History Narrative   Fun: play  with her child   Denies religious beliefs that would effect health care.    Social Drivers of Corporate investment banker Strain: Not on file  Food Insecurity: Not on file  Transportation Needs: Not on file  Physical Activity: Not on file  Stress: Not on file  Social Connections: Unknown (04/29/2022)   Received from [facility]   Social Network    Social Network: Not on file    Family History  Problem Relation Age of Onset   Multiple sclerosis Mother    Diabetes Maternal Grandmother    Hypertension Maternal Grandmother    Cancer Maternal Grandfather    Emphysema Maternal Grandfather    Diabetes Paternal Grandmother    Hypertension Paternal Grandmother    Cancer Paternal Grandfather    Colon cancer Maternal Uncle    Liver cancer Neg Hx    Pancreatic cancer Neg Hx    Rectal cancer Neg Hx    Stomach cancer Neg Hx     (Not in a hospital admission)   No Known Allergies  Review of Systems: Negative except for what is mentioned in HPI.     Physical Exam: BP 127/83   Pulse 89  Ht 5' 3 (1.6 m)   Wt 186 lb (84.4 kg)   LMP 05/12/2024 (Exact Date)   BMI 32.95 kg/m  CONSTITUTIONAL: Well-developed, well-nourished female in no acute distress.  HENT:  Normocephalic, atraumatic, External right and left ear normal. Oropharynx is clear and moist EYES: Conjunctivae and EOM are normal.  NECK: Normal range of motion, supple, no masses SKIN: Skin is warm and dry. No rash noted. Not diaphoretic. No erythema. No pallor. NEUROLGIC: Alert and oriented to person, place, and time. Normal reflexes, muscle tone coordination. No cranial nerve deficit noted. PSYCHIATRIC: Normal mood and affect. Normal behavior. Normal judgment and thought content. CARDIOVASCULAR: Normal heart rate noted, regular rhythm RESPIRATORY: Effort and breath sounds normal, no problems with respiration noted ABDOMEN: Soft, nontender, nondistended,Well-healed Pfannenstiel incision. PELVIC:  Deferred MUSCULOSKELETAL: Normal range of motion. No edema and no tenderness. 2+ distal pulses.   Pertinent Labs/Studies:   No results found for this or any previous visit (from the past 72 hours).     Assessment and Plan :Courtney [name] is a 43 y.o. [id number] here for preoperative evaluation for uterine ablation. Endometrial biopsy performed today as well.  See separate note..   Plan for hysteroscopy dilation and curettage and novasure uterine ablation. NPO Admission labs ordered VS Q4 Risks and benefits of the procedure given including bleeding, infection, involvement of other organs as well as uterine perforation. Rx for cytotec sent.   Avie Boeck, M.D. Attending Obstetrician & Gynecologist, [facility] for [employer], [facility]

## 2024-05-26 NOTE — H&P (View-Only) (Signed)
 ENDOMETRIAL BIOPSY      Daytona Hedman is a 43 y.o. [id number] here for endometrial biopsy.  The indications for endometrial biopsy were reviewed.  Risks of the biopsy including cramping, bleeding, infection, uterine perforation, inadequate specimen and need for additional procedures were discussed. The patient states she understands and agrees to undergo procedure today. Consent was signed. Time out was performed.   Indications: DUB Urine HCG: neg  A bivalve speculum was placed into the vagina and the cervix was easily visualized and was prepped with Betadine x2. A single-toothed tenaculum was placed on the anterior lip of the cervix to stabilize it. The 3 mm pipelle was introduced into the endometrial cavity without difficulty to a depth of 9 cm, and a moderate amount of tissue was obtained and sent to pathology. This was repeated for a total of 2 passes. The instruments were removed from the patient's vagina. Minimal bleeding from the cervix at the tenaculum was noted.   The patient tolerated the procedure well. Routine post-procedure instructions were given to the patient.    Will base further management on results of biopsy.  Avie Boeck, MD

## 2024-05-26 NOTE — Progress Notes (Signed)
 43 y.o. GYN presents for ENDO Biopsy for DUB.  UPT today Negative.

## 2024-05-30 LAB — SURGICAL PATHOLOGY

## 2024-06-03 ENCOUNTER — Ambulatory Visit: Payer: Self-pay | Admitting: Obstetrics and Gynecology

## 2024-06-14 ENCOUNTER — Encounter (HOSPITAL_COMMUNITY): Payer: Self-pay | Admitting: Obstetrics and Gynecology

## 2024-06-14 ENCOUNTER — Encounter (HOSPITAL_COMMUNITY): Payer: Self-pay

## 2024-06-14 NOTE — Progress Notes (Signed)
 Spoke w/ via phone for pre-op interview--- Courtney [name] needs dos----  CBC, APTT, UPT, T&S, PT/INR per surgeon. BMP and EKG per anesthesia.       [name] results------ COVID test -----patient states asymptomatic no test needed Arrive at -------1130 NPO after MN NO Solid Food.  Clear liquids from MN until---1030 Pre-Surgery Ensure or G2:  Med rec completed Medications to take morning of surgery -----NONE Diabetic medication -----  GLP1 agonist last dose: GLP1 instructions:  Patient instructed no nail polish to be worn day of surgery Patient instructed to bring photo id and insurance card day of surgery Patient aware to have Driver (ride ) / caregiver    for 24 hours after surgery - Mother Courtney [name] Patient Special Instructions ----- Cytotec  per surgeon instructions. Shower with antibacterial soap. Pre-Op special Instructions -----  Patient verbalized understanding of instructions that were given at this phone interview. Patient denies chest pain, sob, fever, cough at the interview.

## 2024-06-20 NOTE — Progress Notes (Signed)
 Called pt regarding arrival time change. Instructed to arrive at 0930, reviewed NPO except clear liquids until 0830. Pt verbalized understanding and states no questions at this time.

## 2024-06-21 ENCOUNTER — Other Ambulatory Visit: Payer: Self-pay

## 2024-06-21 ENCOUNTER — Ambulatory Visit (HOSPITAL_COMMUNITY): Admitting: Anesthesiology

## 2024-06-21 ENCOUNTER — Encounter (HOSPITAL_COMMUNITY): Payer: Self-pay | Admitting: Obstetrics and Gynecology

## 2024-06-21 ENCOUNTER — Encounter (HOSPITAL_COMMUNITY)
Admission: RE | Disposition: A | Discharge status: Home / Self Care | Payer: Self-pay | Source: Home / Self Care | Attending: Obstetrics and Gynecology

## 2024-06-21 ENCOUNTER — Ambulatory Visit (HOSPITAL_COMMUNITY)
Admission: RE | Admit: 2024-06-21 | Discharge: 2024-06-21 | Disposition: A | Discharge status: Home / Self Care | Attending: Obstetrics and Gynecology | Admitting: Obstetrics and Gynecology

## 2024-06-21 DIAGNOSIS — I10 Essential (primary) hypertension: Secondary | ICD-10-CM | POA: Insufficient documentation

## 2024-06-21 DIAGNOSIS — N938 Other specified abnormal uterine and vaginal bleeding: Secondary | ICD-10-CM

## 2024-06-21 DIAGNOSIS — Z79899 Other long term (current) drug therapy: Secondary | ICD-10-CM | POA: Diagnosis not present

## 2024-06-21 DIAGNOSIS — N920 Excessive and frequent menstruation with regular cycle: Secondary | ICD-10-CM

## 2024-06-21 DIAGNOSIS — N921 Excessive and frequent menstruation with irregular cycle: Secondary | ICD-10-CM | POA: Diagnosis present

## 2024-06-21 DIAGNOSIS — D689 Coagulation defect, unspecified: Secondary | ICD-10-CM | POA: Diagnosis not present

## 2024-06-21 LAB — TYPE AND SCREEN
ABO/RH(D): A NEG
Antibody Screen: NEGATIVE

## 2024-06-21 LAB — APTT: aPTT: 29 s (ref 24–36)

## 2024-06-21 LAB — CBC
HCT: 36.1 % (ref 36.0–46.0)
Hemoglobin: 11.3 g/dL — ABNORMAL LOW (ref 12.0–15.0)
MCH: 23.7 pg — ABNORMAL LOW (ref 26.0–34.0)
MCHC: 31.3 g/dL (ref 30.0–36.0)
MCV: 75.7 fL — ABNORMAL LOW (ref 80.0–100.0)
Platelets: 289 K/uL (ref 150–400)
RBC: 4.77 MIL/uL (ref 3.87–5.11)
RDW: 20.7 % — ABNORMAL HIGH (ref 11.5–15.5)
WBC: 8.5 K/uL (ref 4.0–10.5)
nRBC: 0 % (ref 0.0–0.2)

## 2024-06-21 LAB — BASIC METABOLIC PANEL WITH GFR
Anion gap: 10 (ref 5–15)
BUN: 13 mg/dL (ref 6–20)
CO2: 23 mmol/L (ref 22–32)
Calcium: 8.8 mg/dL — ABNORMAL LOW (ref 8.9–10.3)
Chloride: 102 mmol/L (ref 98–111)
Creatinine, Ser: 0.88 mg/dL (ref 0.44–1.00)
GFR, Estimated: >60 mL/min (ref >60)
Glucose, Bld: 94 mg/dL (ref 70–99)
Potassium: 3.7 mmol/L (ref 3.5–5.1)
Sodium: 135 mmol/L (ref 135–145)

## 2024-06-21 LAB — PROTIME-INR
INR: 1 (ref 0.8–1.2)
Prothrombin Time: 13.3 s (ref 11.4–15.2)

## 2024-06-21 LAB — POCT PREGNANCY, URINE: Preg Test, Ur: NEGATIVE

## 2024-06-21 SURGERY — ABLATION, ENDOMETRIUM, HYSTEROSCOPIC
Anesthesia: General | Site: Vagina

## 2024-06-21 MED ORDER — SUGAMMADEX SODIUM 200 MG/2ML IV SOLN
INTRAVENOUS | Status: DC | PRN
  Administered 2024-06-21: 200 mg via INTRAVENOUS

## 2024-06-21 MED ORDER — ROCURONIUM BROMIDE 10 MG/ML (PF) SYRINGE
PREFILLED_SYRINGE | INTRAVENOUS | Status: DC | PRN
  Administered 2024-06-21: 60 mg via INTRAVENOUS

## 2024-06-21 MED ORDER — MIDAZOLAM HCL 2 MG/2ML IJ SOLN
INTRAMUSCULAR | Status: AC
  Filled 2024-06-21: qty 2

## 2024-06-21 MED ORDER — FENTANYL CITRATE (PF) 100 MCG/2ML IJ SOLN: 25.0000 ug | INTRAMUSCULAR | Status: DC | PRN

## 2024-06-21 MED ORDER — LACTATED RINGERS IV SOLN: INTRAVENOUS | Status: DC

## 2024-06-21 MED ORDER — ONDANSETRON HCL 4 MG/2ML IJ SOLN
INTRAMUSCULAR | Status: DC | PRN
  Administered 2024-06-21: 4 mg via INTRAVENOUS

## 2024-06-21 MED ORDER — KETOROLAC TROMETHAMINE 30 MG/ML IJ SOLN
INTRAMUSCULAR | Status: DC | PRN
  Administered 2024-06-21: 15 mg via INTRAVENOUS

## 2024-06-21 MED ORDER — ROCURONIUM BROMIDE 10 MG/ML (PF) SYRINGE
PREFILLED_SYRINGE | INTRAVENOUS | Status: AC
  Filled 2024-06-21: qty 10

## 2024-06-21 MED ORDER — PROPOFOL 10 MG/ML IV BOLUS
INTRAVENOUS | Status: AC
  Filled 2024-06-21: qty 20

## 2024-06-21 MED ORDER — SODIUM CHLORIDE 0.9 % IR SOLN
Status: DC | PRN
  Administered 2024-06-21: 3000 mL

## 2024-06-21 MED ORDER — OXYCODONE HCL 5 MG PO TABS: 5.0000 mg | ORAL_TABLET | Freq: Once | ORAL | Status: DC | PRN

## 2024-06-21 MED ORDER — ACETAMINOPHEN 500 MG PO TABS
1000.0000 mg | ORAL_TABLET | ORAL | Status: AC
  Administered 2024-06-21: 1000 mg via ORAL

## 2024-06-21 MED ORDER — LIDOCAINE 2% (20 MG/ML) 5 ML SYRINGE
INTRAMUSCULAR | Status: DC | PRN
  Administered 2024-06-21: 40 mg via INTRAVENOUS

## 2024-06-21 MED ORDER — OXYCODONE HCL 5 MG/5ML PO SOLN: 5.0000 mg | Freq: Once | ORAL | Status: DC | PRN

## 2024-06-21 MED ORDER — SUGAMMADEX SODIUM 200 MG/2ML IV SOLN
INTRAVENOUS | Status: AC
  Filled 2024-06-21: qty 2

## 2024-06-21 MED ORDER — CHLORHEXIDINE GLUCONATE 0.12 % MT SOLN
OROMUCOSAL | Status: AC
  Filled 2024-06-21: qty 15

## 2024-06-21 MED ORDER — ACETAMINOPHEN 500 MG PO TABS
ORAL_TABLET | ORAL | Status: AC
  Filled 2024-06-21: qty 2

## 2024-06-21 MED ORDER — PROPOFOL 10 MG/ML IV BOLUS
INTRAVENOUS | Status: AC
Start: 2024-06-21 — End: 2024-06-21
  Filled 2024-06-21: qty 20

## 2024-06-21 MED ORDER — SOD CITRATE-CITRIC ACID 500-334 MG/5ML PO SOLN: 30.0000 mL | ORAL | Status: DC

## 2024-06-21 MED ORDER — LIDOCAINE 2% (20 MG/ML) 5 ML SYRINGE
INTRAMUSCULAR | Status: AC
  Filled 2024-06-21: qty 5

## 2024-06-21 MED ORDER — CHLORHEXIDINE GLUCONATE 0.12 % MT SOLN
15.0000 mL | Freq: Once | OROMUCOSAL | Status: AC
Start: 2024-06-21 — End: 2024-06-21
  Administered 2024-06-21: 15 mL via OROMUCOSAL

## 2024-06-21 MED ORDER — ONDANSETRON HCL 4 MG/2ML IJ SOLN
INTRAMUSCULAR | Status: AC
  Filled 2024-06-21: qty 2

## 2024-06-21 MED ORDER — SCOPOLAMINE 1 MG/3DAYS TD PT72
MEDICATED_PATCH | TRANSDERMAL | Status: AC
  Filled 2024-06-21: qty 1

## 2024-06-21 MED ORDER — DROPERIDOL 2.5 MG/ML IJ SOLN: 0.6250 mg | Freq: Once | INTRAMUSCULAR | Status: DC | PRN

## 2024-06-21 MED ORDER — MIDAZOLAM HCL 2 MG/2ML IJ SOLN
INTRAMUSCULAR | Status: DC | PRN
  Administered 2024-06-21: 2 mg via INTRAVENOUS

## 2024-06-21 MED ORDER — FENTANYL CITRATE (PF) 250 MCG/5ML IJ SOLN
INTRAMUSCULAR | Status: DC | PRN
  Administered 2024-06-21: 100 ug via INTRAVENOUS
  Administered 2024-06-21: 50 ug via INTRAVENOUS

## 2024-06-21 MED ORDER — ACETAMINOPHEN 10 MG/ML IV SOLN: 1000.0000 mg | Freq: Once | INTRAVENOUS | Status: DC | PRN

## 2024-06-21 MED ORDER — DEXAMETHASONE SODIUM PHOSPHATE 10 MG/ML IJ SOLN
INTRAMUSCULAR | Status: AC
  Filled 2024-06-21: qty 1

## 2024-06-21 MED ORDER — POVIDONE-IODINE 10 % EX SWAB: 2.0000 | Freq: Once | CUTANEOUS | Status: DC

## 2024-06-21 MED ORDER — FENTANYL CITRATE (PF) 250 MCG/5ML IJ SOLN
INTRAMUSCULAR | Status: AC
  Filled 2024-06-21: qty 5

## 2024-06-21 MED ORDER — IBUPROFEN 600 MG PO TABS
600.0000 mg | ORAL_TABLET | Freq: Four times a day (QID) | ORAL | 1 refills | Status: AC | PRN
Start: 2024-06-21 — End: ?

## 2024-06-21 MED ORDER — SCOPOLAMINE 1 MG/3DAYS TD PT72
1.0000 | MEDICATED_PATCH | TRANSDERMAL | Status: DC
  Administered 2024-06-21: 1.5 mg via TRANSDERMAL

## 2024-06-21 MED ORDER — KETOROLAC TROMETHAMINE 30 MG/ML IJ SOLN
INTRAMUSCULAR | Status: AC
  Filled 2024-06-21: qty 1

## 2024-06-21 MED ORDER — TRANEXAMIC ACID-NACL 1000-0.7 MG/100ML-% IV SOLN
1000.0000 mg | Freq: Once | INTRAVENOUS | Status: AC
  Administered 2024-06-21: 1000 mg via INTRAVENOUS
  Filled 2024-06-21: qty 100

## 2024-06-21 MED ORDER — ORAL CARE MOUTH RINSE: 15.0000 mL | Freq: Once | OROMUCOSAL | Status: AC

## 2024-06-21 MED ORDER — DEXAMETHASONE SODIUM PHOSPHATE 10 MG/ML IJ SOLN
INTRAMUSCULAR | Status: DC | PRN
  Administered 2024-06-21: 10 mg via INTRAVENOUS

## 2024-06-21 MED ORDER — PROPOFOL 10 MG/ML IV BOLUS
INTRAVENOUS | Status: DC | PRN
  Administered 2024-06-21: 150 mg via INTRAVENOUS

## 2024-06-21 SURGICAL SUPPLY — 16 items
ABLATOR SURESOUND NOVASURE (ABLATOR) ×2 IMPLANT
COVER MAYO STAND STRL (DRAPES) ×2 IMPLANT
GLOVE BIOGEL PI IND STRL 7.0 (GLOVE) IMPLANT
GLOVE BIOGEL PI IND STRL 7.5 (GLOVE) IMPLANT
GLOVE SURG ORTHO 8.0 STRL STRW (GLOVE) ×2 IMPLANT
GLOVE SURG SS PI 7.0 STRL IVOR (GLOVE) IMPLANT
GLOVE SURG UNDER POLY LF SZ7 (GLOVE) ×4 IMPLANT
GOWN STRL REUS W/ TWL LRG LVL3 (GOWN DISPOSABLE) ×4 IMPLANT
KIT PROCEDURE FLUENT (KITS) ×2 IMPLANT
KIT TURNOVER KIT B (KITS) ×2 IMPLANT
PACK VAGINAL MINOR WOMEN LF (CUSTOM PROCEDURE TRAY) ×2 IMPLANT
PAD OB MATERNITY 11 LF (PERSONAL CARE ITEMS) ×2 IMPLANT
SLEEVE SCD COMPRESS KNEE MED (STOCKING) ×2 IMPLANT
SOL .9 NS 3000ML IRR UROMATIC (IV SOLUTION) IMPLANT
SOL PREP POV-IOD 4OZ 10% (MISCELLANEOUS) IMPLANT
TOWEL GREEN STERILE FF (TOWEL DISPOSABLE) ×4 IMPLANT

## 2024-06-21 NOTE — Anesthesia Postprocedure Evaluation (Signed)
 Anesthesia Post Note  Patient: Courtney [name]  Procedure(s) Performed: ABLATION, ENDOMETRIUM, HYSTEROSCOPIC (Uterus) DILATATION AND CURETTAGE /HYSTEROSCOPY (Vagina )     Patient location during evaluation: PACU Anesthesia Type: General Level of consciousness: awake and alert Pain management: pain level controlled Vital Signs Assessment: post-procedure vital signs reviewed and stable Respiratory status: spontaneous breathing, nonlabored ventilation, respiratory function stable and patient connected to nasal cannula oxygen Cardiovascular status: blood pressure returned to baseline and stable Postop Assessment: no apparent nausea or vomiting Anesthetic complications: no   No notable events documented.  Last Vitals:  Vitals:   06/21/24 1332 06/21/24 1400  BP: (!) 126/57 124/63  Pulse: 90 68  Resp: 10 16  Temp: 36.6 C   SpO2: 100% 93%    Last Pain:  Vitals:   06/21/24 1400  TempSrc:   PainSc: 0-No pain   Pain Goal: Patients Stated Pain Goal: 6 (06/21/24 1007)                 Courtney [name]

## 2024-06-21 NOTE — Interval H&P Note (Signed)
 History and Physical Interval Note:  06/21/2024 9:49 AM  Courtney [name]  has presented today for surgery, with the diagnosis of Abnormal uterine bleeding [place] Blood coagulation disorder.  The various methods of treatment have been discussed with the patient and family. After consideration of risks, benefits and other options for treatment, the patient has consented to  Procedure(s) with comments: ABLATION, ENDOMETRIUM, HYSTEROSCOPIC (N/A) - Novasure as a surgical intervention.  The patient's history has been reviewed, patient examined, no change in status, stable for surgery.  I have reviewed the patient's chart and labs.  Questions were answered to the patient's satisfaction.     [name]

## 2024-06-21 NOTE — Discharge Instructions (Addendum)
 DO NOT TAKE MOTRIN  UNTIL AFTER 7PM TONIGHT DO NOT TAKE TYLENOL  UNTIL AFTER 5PM    Post Anesthesia Home Care Instructions  Activity: Get plenty of rest for the remainder of the day. A responsible adult should stay with you for 24 hours following the procedure.  For the next 24 hours, DO NOT: -Drive a car -Advertising copywriter -Drink alcoholic beverages -Take any medication unless instructed by your physician -Make any legal decisions or sign important papers.  Meals: Start with liquid foods such as gelatin or soup. Progress to regular foods as tolerated. Avoid greasy, spicy, heavy foods. If nausea and/or vomiting occur, drink only clear liquids until the nausea and/or vomiting subsides. Call your physician if vomiting continues.  Special Instructions/Symptoms: Your throat may feel dry or sore from the anesthesia or the breathing tube placed in your throat during surgery. If this causes discomfort, gargle with warm salt water. The discomfort should disappear within 24 hours.  If you had a scopolamine  patch placed behind your ear for the management of post- operative nausea and/or vomiting:  1. The medication in the patch is effective for 72 hours, after which it should be removed.  Wrap patch in a tissue and discard in the trash. Wash hands thoroughly with soap and water. 2. You may remove the patch earlier than 72 hours if you experience unpleasant side effects which may include dry mouth, dizziness or visual disturbances. 3. Avoid touching the patch. Wash your hands with soap and water after contact with the patch.  Call your surgeon if you experience:   1.  Fever over 101.0. 2.  Inability to urinate. 3.  Nausea and/or vomiting. 4.  Extreme swelling or bruising at the surgical site. 5.  Continued bleeding from the incision. 6.  Increased pain, redness or drainage from the incision. 7.  Problems related to your pain medication. 8. Any change in color, movement and/or sensation 9. Any  problems and/or concerns

## 2024-06-21 NOTE — Anesthesia Procedure Notes (Signed)
 Procedure Name: Intubation Date/Time: 06/21/2024 12:35 PM  Performed by: Genny Gun, CRNAPre-anesthesia Checklist: Patient identified, Emergency Drugs available, Suction available, Patient being monitored and Timeout performed Patient Re-evaluated:Patient Re-evaluated prior to induction Oxygen Delivery Method: Circle system utilized Preoxygenation: Pre-oxygenation with 100% oxygen Induction Type: IV induction Ventilation: Mask ventilation without difficulty and Oral airway inserted - appropriate to patient size Laryngoscope Size: Mac and 3 Grade View: Grade I Tube type: Oral Tube size: 7.0 mm Number of attempts: 1 Placement Confirmation: ETT inserted through vocal cords under direct vision, positive ETCO2 and breath sounds checked- equal and bilateral Secured at: 20 cm Tube secured with: Tape Dental Injury: Teeth and Oropharynx as per pre-operative assessment

## 2024-06-21 NOTE — Op Note (Addendum)
 PREOPERATIVE DIAGNOSIS:  menorrhagia POSTOPERATIVE DIAGNOSIS: The same PROCEDURE:  Hysteroscopy,  Dilation and Curettage, NovaSure Endometrial Ablation SURGEON:  Dr. Jerilynn Buddle  INDICATIONS: 43 y.o. [id number]  here for NovaSure Endometrial Ablation.  Risks of surgery were discussed with the patient including but not limited to: bleeding which may require transfusion; infection which may require antibiotics; injury to uterus leading to risk of injury to surrounding intraperitoneal organs, need for additional procedures including laparoscopy or laparotomy, and other postoperative/anesthesia complications. Written informed consent was obtained.   FINDINGS:  A 10 week size uterus.  Diffuse proliferative endometrium.  Extremely shaggy endometrium with possible uterine polyp on posterior endometrium.  Normal ostia bilaterally.  ANESTHESIA:   General  INTRAVENOUS FLUIDS:  700 ml of LR FLUID DEFICITS:  65ml of Lactated Ringers  ESTIMATED BLOOD LOSS:  Less than 10 ml SPECIMENS: Endometrial curettings sent to pathology COMPLICATIONS:  None immediate  PROCEDURE DETAILS:  The patient was taken to the operating room where general anesthesia was administered and was found to be adequate.  After an adequate timeout was performed, she was placed in the dorsal lithotomy position and examined; then prepped and draped in the sterile manner.  A speculum was then placed in the patient's vagina and a single tooth tenaculum was applied to the anterior lip of the cervix.  The cervix was dilated manually with Hegar dilators to accommodate the diagnostic hysteroscope.  Once the cervix was dilated, the hysteroscope was inserted under direct visualization.   The hysteroscope was also used to determine the level of the internal os, and measurements were confirmed. The uterine cavity was carefully examined, both ostia were recognized, and diffusely proliferative endometrium with polypoid fragments was noted. The hysteroscope was  removed and a curettage was done to obtain some endometrial curettings. The sound was used to obtain uterine cavity length measurement of 4 cm.    The cervix was further dilated to accommodate the NovaSure device.  The NovaSure device was inserted, and a cavity width of 3.7 cm was determined. Using a power of 81 watts, for 1 minute 40 seconds, the endometrial ablation was performed.  The tenaculum was removed from the anterior lip of the cervix, and the vaginal speculum was removed after noting good hemostasis.  The patient tolerated the procedure well and was taken to the recovery area awake, extubated and in stable condition.  The patient will be discharged to home as per PACU criteria.  Routine postoperative instructions given.  She was prescribed ibuprofen .  She will follow up in the clinic in 2-3 weeks for postoperative evaluation.    Jerilynn Buddle, MD Faculty attending, [facility] for [employer].

## 2024-06-21 NOTE — Transfer of Care (Signed)
 Immediate Anesthesia Transfer of Care Note  Patient: Courtney [name]  Procedure(s) Performed: ABLATION, ENDOMETRIUM, HYSTEROSCOPIC (Uterus) DILATATION AND CURETTAGE /HYSTEROSCOPY (Vagina )  Patient Location: PACU  Anesthesia Type:General  Level of Consciousness: awake  Airway & Oxygen Therapy: Patient Spontanous Breathing and Patient connected to face mask oxygen  Post-op Assessment: Report given to RN and Post -op Vital signs reviewed and stable  Post vital signs: Reviewed and stable  Last Vitals:  Vitals Value Taken Time  BP 126/57 06/21/24 13:32  Temp 36.6 C 06/21/24 13:32  Pulse 77 06/21/24 13:43  Resp 9 06/21/24 13:40  SpO2 93 % 06/21/24 13:43  Vitals shown include unfiled device data.  Last Pain:  Vitals:   06/21/24 1332  TempSrc:   PainSc: Asleep      Patients Stated Pain Goal: 6 (06/21/24 1007)  Complications: No notable events documented.

## 2024-06-21 NOTE — Anesthesia Preprocedure Evaluation (Addendum)
 Anesthesia Evaluation  Patient identified by MRN, date of birth, ID band Patient awake    Reviewed: Allergy & Precautions, NPO status , Patient's Chart, lab work & pertinent test results  Airway Mallampati: II  TM Distance: >3 FB Neck ROM: Full    Dental  (+) Teeth Intact, Dental Advisory Given   Pulmonary neg pulmonary ROS   breath sounds clear to auscultation       Cardiovascular hypertension, Pt. on medications  Rhythm:Regular Rate:Normal     Neuro/Psych  PSYCHIATRIC DISORDERS  Depression    negative neurological ROS     GI/Hepatic negative GI ROS, Neg liver ROS,,,  Endo/Other  negative endocrine ROS    Renal/GU negative Renal ROS     Musculoskeletal negative musculoskeletal ROS (+)    Abdominal   Peds  Hematology negative hematology ROS (+)   Anesthesia Other Findings   Reproductive/Obstetrics                              Anesthesia Physical Anesthesia Plan  ASA: 2  Anesthesia Plan: General   Post-op Pain Management: Tylenol  PO (pre-op)* and Toradol  IV (intra-op)*   Induction: Intravenous  PONV Risk Score and Plan: 4 or greater and Ondansetron , Dexamethasone , Midazolam  and Scopolamine  patch - Pre-op  Airway Management Planned: Oral ETT  Additional Equipment: None  Intra-op Plan:   Post-operative Plan: Extubation in OR  Informed Consent: I have reviewed the patients History and Physical, chart, labs and discussed the procedure including the risks, benefits and alternatives for the proposed anesthesia with the patient or authorized representative who has indicated his/her understanding and acceptance.     Dental advisory given  Plan Discussed with: CRNA  Anesthesia Plan Comments:          Anesthesia Quick Evaluation

## 2024-06-22 ENCOUNTER — Ambulatory Visit: Payer: Self-pay | Admitting: Obstetrics and Gynecology

## 2024-06-22 ENCOUNTER — Encounter (HOSPITAL_COMMUNITY): Payer: Self-pay | Admitting: Obstetrics and Gynecology

## 2024-06-22 LAB — SURGICAL PATHOLOGY

## 2024-07-13 ENCOUNTER — Ambulatory Visit (INDEPENDENT_AMBULATORY_CARE_PROVIDER_SITE_OTHER): Admitting: Obstetrics and Gynecology

## 2024-07-13 VITALS — BP 142/82 | HR 96 | Wt 190.0 lb

## 2024-07-13 DIAGNOSIS — Z4889 Encounter for other specified surgical aftercare: Secondary | ICD-10-CM | POA: Diagnosis not present

## 2024-07-13 NOTE — Progress Notes (Signed)
 Pt is in office for post op visit, pt is doing well.

## 2024-07-13 NOTE — Progress Notes (Signed)
    Subjective:    Courtney [name] is a 43 y.o. female who presents to the clinic status post hysteroscopy D and C with novasure ablation on 06/21/24. The patient is not having any pain.  Eating a regular diet without difficulty. Bowel movements are normal. No other significant postoperative concerns.  The following portions of the patient's history were reviewed and updated as appropriate: allergies, current medications, past family history, past medical history, past social history, past surgical history, and problem list..  Last pap smear was normal on 8/23.  Review of Systems Pertinent items are noted in HPI.   Objective:   BP (!) 142/82   Pulse 96   Wt 190 lb (86.2 kg)   BMI 33.66 kg/m  Constitutional:  Well-developed, well-nourished female in no acute distress.   Skin: Skin is warm and dry, no rash noted, not diaphoretic,no erythema, no pallor.  Cardiovascular: Normal heart rate noted  Respiratory: Effort and breath sounds normal, no problems with respiration noted  Abdomen: Soft, bowel sounds active, non-tender, no abnormal masses  Incision: N/aq  Pelvic:   Deferred   Surgical pathology () Secretory endometrium Assessment:   Doing well postoperatively.  Operative findings again reviewed. Pathology report discussed.   Plan:   1. Continue any current medications. 2. Wound care discussed. 3. Activity restrictions: none 4. Anticipated return to work: now. 5. Follow up as needed 6.  Routine preventative health maintenance measures emphasized. Please refer to After Visit Summary for other counseling recommendations.   F/u in 4 months with virtual visit to discuss current menses  Jerilynn Buddle, MD, FACOG Attending Obstetrician & Gynecologist Center for [facility], [facility]

## 2024-07-28 ENCOUNTER — Other Ambulatory Visit: Payer: Self-pay | Admitting: Obstetrics and Gynecology

## 2024-07-28 DIAGNOSIS — Z1231 Encounter for screening mammogram for malignant neoplasm of breast: Secondary | ICD-10-CM

## 2024-08-31 ENCOUNTER — Ambulatory Visit
Admission: RE | Admit: 2024-08-31 | Discharge: 2024-08-31 | Disposition: A | Discharge status: Home / Self Care | Source: Ambulatory Visit | Attending: Obstetrics and Gynecology | Admitting: Obstetrics and Gynecology

## 2024-08-31 DIAGNOSIS — Z1231 Encounter for screening mammogram for malignant neoplasm of breast: Secondary | ICD-10-CM

## 2024-09-05 ENCOUNTER — Ambulatory Visit: Payer: Self-pay | Admitting: Obstetrics and Gynecology

## 2024-12-06 ENCOUNTER — Other Ambulatory Visit: Payer: Self-pay | Admitting: Obstetrics
